# Patient Record
Sex: Female | Born: 1955
Health system: Southern US, Community
[De-identification: ages and names within clinical notes are randomized; demographics above are authoritative.]

## PROBLEM LIST (undated history)

## (undated) DIAGNOSIS — H269 Unspecified cataract: Secondary | ICD-10-CM

## (undated) DIAGNOSIS — E1165 Type 2 diabetes mellitus with hyperglycemia: Secondary | ICD-10-CM

## (undated) DIAGNOSIS — G5601 Carpal tunnel syndrome, right upper limb: Secondary | ICD-10-CM

## (undated) DIAGNOSIS — E781 Pure hyperglyceridemia: Secondary | ICD-10-CM

## (undated) DIAGNOSIS — M5412 Radiculopathy, cervical region: Secondary | ICD-10-CM

## (undated) DIAGNOSIS — Z860101 Personal history of adenomatous and serrated colon polyps: Secondary | ICD-10-CM

## (undated) DIAGNOSIS — E785 Hyperlipidemia, unspecified: Secondary | ICD-10-CM

## (undated) DIAGNOSIS — Z8711 Personal history of peptic ulcer disease: Secondary | ICD-10-CM

## (undated) DIAGNOSIS — M720 Palmar fascial fibromatosis [Dupuytren]: Secondary | ICD-10-CM

## (undated) DIAGNOSIS — M810 Age-related osteoporosis without current pathological fracture: Secondary | ICD-10-CM

## (undated) DIAGNOSIS — Z8719 Personal history of other diseases of the digestive system: Secondary | ICD-10-CM

## (undated) DIAGNOSIS — Z8742 Personal history of other diseases of the female genital tract: Secondary | ICD-10-CM

## (undated) DIAGNOSIS — IMO0002 Reserved for concepts with insufficient information to code with codable children: Secondary | ICD-10-CM

## (undated) DIAGNOSIS — Z86010 Personal history of colonic polyps: Secondary | ICD-10-CM

## (undated) DIAGNOSIS — J302 Other seasonal allergic rhinitis: Secondary | ICD-10-CM

## (undated) DIAGNOSIS — M1990 Unspecified osteoarthritis, unspecified site: Secondary | ICD-10-CM

## (undated) DIAGNOSIS — E559 Vitamin D deficiency, unspecified: Secondary | ICD-10-CM

## (undated) HISTORY — DX: Age-related osteoporosis without current pathological fracture: M81.0

## (undated) HISTORY — PX: ESOPHAGOGASTRODUODENOSCOPY: SHX1529

## (undated) HISTORY — DX: Unspecified cataract: H26.9

## (undated) HISTORY — DX: Unspecified osteoarthritis, unspecified site: M19.90

## (undated) HISTORY — PX: COLONOSCOPY: SHX174

## (undated) HISTORY — DX: Other seasonal allergic rhinitis: J30.2

## (undated) HISTORY — PX: DUPUYTREN / PALMAR FASCIOTOMY: SUR601

---

## 1996-12-26 HISTORY — PX: TOTAL ABDOMINAL HYSTERECTOMY W/ BILATERAL SALPINGOOPHORECTOMY: SHX83

## 1998-02-02 ENCOUNTER — Ambulatory Visit (HOSPITAL_COMMUNITY): Admission: RE | Admit: 1998-02-02 | Discharge: 1998-02-02 | Payer: Self-pay | Admitting: Gastroenterology

## 1998-12-26 HISTORY — PX: ROTATOR CUFF REPAIR: SHX139

## 1999-02-24 ENCOUNTER — Encounter: Payer: Self-pay | Admitting: Internal Medicine

## 1999-02-24 ENCOUNTER — Ambulatory Visit (HOSPITAL_COMMUNITY): Admission: RE | Admit: 1999-02-24 | Discharge: 1999-02-24 | Payer: Self-pay | Admitting: Internal Medicine

## 1999-08-18 ENCOUNTER — Other Ambulatory Visit: Admission: RE | Admit: 1999-08-18 | Discharge: 1999-08-18 | Payer: Self-pay | Admitting: *Deleted

## 2000-02-24 ENCOUNTER — Encounter: Admission: RE | Admit: 2000-02-24 | Discharge: 2000-02-24 | Payer: Self-pay | Admitting: Internal Medicine

## 2000-02-24 ENCOUNTER — Encounter: Payer: Self-pay | Admitting: Internal Medicine

## 2000-03-02 ENCOUNTER — Ambulatory Visit (HOSPITAL_COMMUNITY): Admission: RE | Admit: 2000-03-02 | Discharge: 2000-03-02 | Payer: Self-pay | Admitting: Gastroenterology

## 2000-03-08 ENCOUNTER — Encounter: Payer: Self-pay | Admitting: Internal Medicine

## 2000-03-08 ENCOUNTER — Ambulatory Visit (HOSPITAL_COMMUNITY): Admission: RE | Admit: 2000-03-08 | Discharge: 2000-03-08 | Payer: Self-pay | Admitting: Internal Medicine

## 2000-05-26 ENCOUNTER — Encounter: Payer: Self-pay | Admitting: General Surgery

## 2000-05-26 ENCOUNTER — Ambulatory Visit (HOSPITAL_COMMUNITY): Admission: RE | Admit: 2000-05-26 | Discharge: 2000-05-26 | Payer: Self-pay | Admitting: General Surgery

## 2000-06-01 ENCOUNTER — Ambulatory Visit (HOSPITAL_COMMUNITY): Admission: RE | Admit: 2000-06-01 | Discharge: 2000-06-01 | Payer: Self-pay | Admitting: Gastroenterology

## 2000-07-14 ENCOUNTER — Encounter: Payer: Self-pay | Admitting: General Surgery

## 2000-07-17 ENCOUNTER — Encounter: Payer: Self-pay | Admitting: General Surgery

## 2000-07-17 ENCOUNTER — Encounter (INDEPENDENT_AMBULATORY_CARE_PROVIDER_SITE_OTHER): Payer: Self-pay | Admitting: *Deleted

## 2000-07-17 ENCOUNTER — Ambulatory Visit (HOSPITAL_COMMUNITY): Admission: RE | Admit: 2000-07-17 | Discharge: 2000-07-18 | Payer: Self-pay | Admitting: General Surgery

## 2000-07-17 HISTORY — PX: LAPAROSCOPIC CHOLECYSTECTOMY: SUR755

## 2000-08-17 ENCOUNTER — Other Ambulatory Visit: Admission: RE | Admit: 2000-08-17 | Discharge: 2000-08-17 | Payer: Self-pay | Admitting: *Deleted

## 2000-09-07 ENCOUNTER — Encounter: Payer: Self-pay | Admitting: *Deleted

## 2000-09-07 ENCOUNTER — Encounter: Admission: RE | Admit: 2000-09-07 | Discharge: 2000-09-07 | Payer: Self-pay | Admitting: *Deleted

## 2001-01-04 ENCOUNTER — Encounter: Payer: Self-pay | Admitting: Internal Medicine

## 2001-01-04 ENCOUNTER — Encounter: Admission: RE | Admit: 2001-01-04 | Discharge: 2001-01-04 | Payer: Self-pay | Admitting: Internal Medicine

## 2001-08-20 ENCOUNTER — Other Ambulatory Visit: Admission: RE | Admit: 2001-08-20 | Discharge: 2001-08-20 | Payer: Self-pay | Admitting: *Deleted

## 2001-09-10 ENCOUNTER — Encounter: Payer: Self-pay | Admitting: *Deleted

## 2001-09-10 ENCOUNTER — Encounter: Admission: RE | Admit: 2001-09-10 | Discharge: 2001-09-10 | Payer: Self-pay | Admitting: *Deleted

## 2001-11-20 ENCOUNTER — Encounter: Payer: Self-pay | Admitting: Emergency Medicine

## 2001-11-20 ENCOUNTER — Emergency Department (HOSPITAL_COMMUNITY): Admission: EM | Admit: 2001-11-20 | Discharge: 2001-11-20 | Payer: Self-pay | Admitting: Emergency Medicine

## 2002-09-06 ENCOUNTER — Emergency Department (HOSPITAL_COMMUNITY): Admission: EM | Admit: 2002-09-06 | Discharge: 2002-09-06 | Payer: Self-pay | Admitting: Emergency Medicine

## 2002-09-06 ENCOUNTER — Encounter: Payer: Self-pay | Admitting: Emergency Medicine

## 2002-09-19 ENCOUNTER — Other Ambulatory Visit: Admission: RE | Admit: 2002-09-19 | Discharge: 2002-09-19 | Payer: Self-pay | Admitting: *Deleted

## 2002-10-08 ENCOUNTER — Encounter: Payer: Self-pay | Admitting: *Deleted

## 2002-10-08 ENCOUNTER — Encounter: Admission: RE | Admit: 2002-10-08 | Discharge: 2002-10-08 | Payer: Self-pay | Admitting: *Deleted

## 2002-11-19 ENCOUNTER — Ambulatory Visit (HOSPITAL_COMMUNITY): Admission: RE | Admit: 2002-11-19 | Discharge: 2002-11-19 | Payer: Self-pay | Admitting: Gastroenterology

## 2002-11-19 ENCOUNTER — Encounter (INDEPENDENT_AMBULATORY_CARE_PROVIDER_SITE_OTHER): Payer: Self-pay | Admitting: Specialist

## 2003-10-09 ENCOUNTER — Other Ambulatory Visit: Admission: RE | Admit: 2003-10-09 | Discharge: 2003-10-09 | Payer: Self-pay | Admitting: *Deleted

## 2003-10-13 ENCOUNTER — Encounter: Payer: Self-pay | Admitting: *Deleted

## 2003-10-13 ENCOUNTER — Ambulatory Visit (HOSPITAL_COMMUNITY): Admission: RE | Admit: 2003-10-13 | Discharge: 2003-10-13 | Payer: Self-pay | Admitting: *Deleted

## 2004-10-13 ENCOUNTER — Ambulatory Visit (HOSPITAL_COMMUNITY): Admission: RE | Admit: 2004-10-13 | Discharge: 2004-10-13 | Payer: Self-pay | Admitting: *Deleted

## 2004-11-16 ENCOUNTER — Other Ambulatory Visit: Admission: RE | Admit: 2004-11-16 | Discharge: 2004-11-16 | Payer: Self-pay | Admitting: *Deleted

## 2005-05-05 ENCOUNTER — Emergency Department (HOSPITAL_COMMUNITY): Admission: EM | Admit: 2005-05-05 | Discharge: 2005-05-05 | Payer: Self-pay | Admitting: Emergency Medicine

## 2005-08-15 ENCOUNTER — Emergency Department (HOSPITAL_COMMUNITY): Admission: EM | Admit: 2005-08-15 | Discharge: 2005-08-15 | Payer: Self-pay | Admitting: Family Medicine

## 2005-10-17 ENCOUNTER — Ambulatory Visit: Payer: Self-pay | Admitting: *Deleted

## 2005-11-14 ENCOUNTER — Other Ambulatory Visit: Admission: RE | Admit: 2005-11-14 | Discharge: 2005-11-14 | Payer: Self-pay | Admitting: *Deleted

## 2005-12-21 ENCOUNTER — Emergency Department (HOSPITAL_COMMUNITY): Admission: EM | Admit: 2005-12-21 | Discharge: 2005-12-21 | Payer: Self-pay | Admitting: Family Medicine

## 2006-04-10 ENCOUNTER — Ambulatory Visit (HOSPITAL_COMMUNITY): Admission: RE | Admit: 2006-04-10 | Discharge: 2006-04-10 | Payer: Self-pay | Admitting: Internal Medicine

## 2006-11-14 ENCOUNTER — Ambulatory Visit: Payer: Self-pay | Admitting: *Deleted

## 2007-04-11 ENCOUNTER — Other Ambulatory Visit: Admission: RE | Admit: 2007-04-11 | Discharge: 2007-04-11 | Payer: Self-pay | Admitting: *Deleted

## 2007-06-26 ENCOUNTER — Encounter: Admission: RE | Admit: 2007-06-26 | Discharge: 2007-06-26 | Payer: Self-pay | Admitting: Internal Medicine

## 2007-07-06 ENCOUNTER — Ambulatory Visit (HOSPITAL_COMMUNITY): Admission: RE | Admit: 2007-07-06 | Discharge: 2007-07-06 | Payer: Self-pay | Admitting: Gastroenterology

## 2007-07-06 ENCOUNTER — Encounter (INDEPENDENT_AMBULATORY_CARE_PROVIDER_SITE_OTHER): Payer: Self-pay | Admitting: Gastroenterology

## 2007-10-21 ENCOUNTER — Emergency Department (HOSPITAL_COMMUNITY): Admission: EM | Admit: 2007-10-21 | Discharge: 2007-10-21 | Payer: Self-pay | Admitting: Emergency Medicine

## 2008-02-26 ENCOUNTER — Ambulatory Visit (HOSPITAL_COMMUNITY): Admission: RE | Admit: 2008-02-26 | Discharge: 2008-02-26 | Payer: Self-pay | Admitting: *Deleted

## 2008-08-09 ENCOUNTER — Emergency Department (HOSPITAL_COMMUNITY): Admission: EM | Admit: 2008-08-09 | Discharge: 2008-08-09 | Payer: Self-pay | Admitting: Family Medicine

## 2008-12-08 ENCOUNTER — Emergency Department (HOSPITAL_COMMUNITY): Admission: EM | Admit: 2008-12-08 | Discharge: 2008-12-08 | Payer: Self-pay | Admitting: Family Medicine

## 2009-03-14 ENCOUNTER — Emergency Department (HOSPITAL_COMMUNITY): Admission: EM | Admit: 2009-03-14 | Discharge: 2009-03-14 | Payer: Self-pay | Admitting: Emergency Medicine

## 2009-03-17 ENCOUNTER — Emergency Department (HOSPITAL_COMMUNITY): Admission: EM | Admit: 2009-03-17 | Discharge: 2009-03-17 | Payer: Self-pay | Admitting: *Deleted

## 2009-04-16 ENCOUNTER — Encounter: Admission: RE | Admit: 2009-04-16 | Discharge: 2009-04-16 | Payer: Self-pay | Admitting: General Practice

## 2009-10-16 ENCOUNTER — Emergency Department (HOSPITAL_COMMUNITY): Admission: EM | Admit: 2009-10-16 | Discharge: 2009-10-16 | Payer: Self-pay | Admitting: Emergency Medicine

## 2011-03-22 ENCOUNTER — Emergency Department (HOSPITAL_COMMUNITY)
Admission: EM | Admit: 2011-03-22 | Discharge: 2011-03-22 | Disposition: A | Discharge status: Home / Self Care | Payer: Self-pay | Attending: Emergency Medicine | Admitting: Emergency Medicine

## 2011-03-22 DIAGNOSIS — L089 Local infection of the skin and subcutaneous tissue, unspecified: Secondary | ICD-10-CM | POA: Insufficient documentation

## 2011-03-22 DIAGNOSIS — R220 Localized swelling, mass and lump, head: Secondary | ICD-10-CM | POA: Insufficient documentation

## 2011-04-07 LAB — LACTIC ACID, PLASMA: Lactic Acid, Venous: 1.8 mmol/L (ref 0.5–2.2)

## 2011-04-07 LAB — APTT: aPTT: 28 seconds (ref 24–37)

## 2011-04-07 LAB — URINALYSIS, ROUTINE W REFLEX MICROSCOPIC
Glucose, UA: NEGATIVE mg/dL
Protein, ur: NEGATIVE mg/dL

## 2011-04-07 LAB — CBC
HCT: 39.5 % (ref 36.0–46.0)
Hemoglobin: 13.6 g/dL (ref 12.0–15.0)
Hemoglobin: 14.5 g/dL (ref 12.0–15.0)
MCHC: 34.4 g/dL (ref 30.0–36.0)
MCHC: 34.5 g/dL (ref 30.0–36.0)
RBC: 4.31 MIL/uL (ref 3.87–5.11)
RDW: 13.5 % (ref 11.5–15.5)

## 2011-04-07 LAB — POCT I-STAT, CHEM 8
Glucose, Bld: 119 mg/dL — ABNORMAL HIGH (ref 70–99)
HCT: 41 % (ref 36.0–46.0)
Hemoglobin: 13.9 g/dL (ref 12.0–15.0)
Potassium: 3.3 mEq/L — ABNORMAL LOW (ref 3.5–5.1)
Sodium: 140 mEq/L (ref 135–145)

## 2011-04-07 LAB — COMPREHENSIVE METABOLIC PANEL
BUN: 5 mg/dL — ABNORMAL LOW (ref 6–23)
Calcium: 8.9 mg/dL (ref 8.4–10.5)
Glucose, Bld: 118 mg/dL — ABNORMAL HIGH (ref 70–99)
Sodium: 136 mEq/L (ref 135–145)
Total Protein: 6.4 g/dL (ref 6.0–8.3)

## 2011-04-07 LAB — DIFFERENTIAL
Lymphs Abs: 2.3 10*3/uL (ref 0.7–4.0)
Monocytes Relative: 7 % (ref 3–12)
Neutro Abs: 3.5 10*3/uL (ref 1.7–7.7)
Neutrophils Relative %: 55 % (ref 43–77)

## 2011-04-07 LAB — URINE MICROSCOPIC-ADD ON

## 2011-04-07 LAB — URINE CULTURE: Colony Count: 50000

## 2011-04-07 LAB — PROTIME-INR: INR: 1 (ref 0.00–1.49)

## 2011-05-10 NOTE — Op Note (Signed)
NAME:  Bailey [name], Bailey [name]             ACCOUNT NO.:  [id number]   MEDICAL RECORD NO.:  [id number]          PATIENT TYPE:  AMB   LOCATION:  ENDO                         FACILITY:  [facility]   PHYSICIAN:  Anselmo Rod, M.D.  DATE OF BIRTH:  02-18-1956   DATE OF PROCEDURE:  07/06/2007  DATE OF DISCHARGE:                               OPERATIVE REPORT   PROCEDURE PERFORMED:  Esophagogastroduodenoscopy with cold biopsies x 3.   ENDOSCOPIST:  Anselmo Rod, M.D.   INSTRUMENT USED:  Pentax video panendoscope.   INDICATIONS FOR PROCEDURE:  55 year old African American female with a  history of abdominal pain and nausea with black stools undergoing EGD to  rule out peptic ulcer disease, esophagitis, gastritis, etc.   PREPROCEDURE PREPARATION:  Informed consent was procured from the  patient.  The patient fasted for eight hours prior to the procedure.  The risks and benefits of the procedure were discussed with the patient  in great detail.   PREPROCEDURE PHYSICAL:  The patient had stable vital signs.  NECK:  Supple.  Chest clear to auscultation.  S1 and S2 regular.  Abdomen soft  with normal bowel sounds.  Epigastric tenderness on palpation with  minimal guarding, no rebound, no rigidity, no hepatosplenomegaly.   DESCRIPTION OF PROCEDURE:  The patient was placed in left lateral  decubitus position and sedated with 50 mcg of Fentanyl and 5 mg of  Versed given intravenously in slow incremental doses. The patient was  adequately sedated and maintained on low-flow oxygen and continuous  cardiac monitoring. The Pentax video panendoscope was advanced through  the mouthpiece over the tongue into the esophagus under direct vision.  The entire esophagus was widely patent with no evidence of ring,  stricture, masses, esophagitis or Barrett's mucosa.  The scope was then  advanced into the stomach.  Small ulcer was noted in the antrum.  Multiple biopsies were done (cold biopsies x3) to rule out  presence of H  pylori. Small amount of old heme was noted in the stomach was no masses  or polyps were identified.  A retroflexion in the high cardia revealed  no evidence of a hiatal hernia.  The patient tolerated the procedure  well without complications.   IMPRESSION:  1. Small ulcer in the antrum.  Biopsies done to rule out presence of H      pylori by pathology.  2. Normal proximal small bowel.  3. Widely patent esophagus.  No evidence of ring, stricture, mass,      esophagitis or Barrett's mucosa.   RECOMMENDATIONS:  1. Continue PPI.  2. Avoid nonsteroidals.  3. Treat with antibiotics if H pylori present on biopsies.  4. Outpatient follow-up in the next 2 weeks for further      recommendations.      Anselmo Rod, M.D.  Electronically Signed     JNM/MEDQ  D:  07/06/2007  T:  07/07/2007  Job:  [id number]   cc:   Antony Madura, M.D.  Fax: [phone]

## 2011-05-13 NOTE — Op Note (Signed)
. [facility]  Patient:    Bailey [name], Bailey [name]                    MRN: [id number] Proc. Date: 07/17/00 Adm. Date:  [id number] Attending:  Glenna Fellows Tappan                           Operative Report  PREOPERATIVE DIAGNOSIS:  Chronic right upper quadrant abdominal pain, biliary dyskinesia.  POSTOPERATIVE DIAGNOSIS:  Chronic right upper quadrant abdominal pain, biliary dyskinesia.  SURGICAL PROCEDURE:  Laparoscopic cholecystectomy with intraoperative cholangiogram.  SURGEON:  Sharlet Salina T. Hoxworth, M.D.  ASSISTANT:  Anselm Pancoast. Zachery Dakins, M.D.  ANESTHESIA:  General.  BRIEF HISTORY:  Ms. Maultsby is a 55 year old white female with a history of persistent, recurring episodes of right upper quadrant abdominal pain, which have become more severe in recent months.  She has had an extensive work-up including a normal gallbladder ultrasound, a normal upper endoscopy, a normal CT scan of the abdomen and a HIDA scan, which showed abnormal delay in emptying of the gallbladder.  Due to persistent pain and these findings, we have elected to proceed with laparoscopic cholecystectomy with cholangiogram in an effort to relieve her pain.  Nature of procedure, its indications, risks of bleeding, infection, and failure to relieve her symptoms were discussed and understood.  She was brought to the operating room for that procedure.  DESCRIPTION OF PROCEDURE:  Patient brought to the operating room, placed in the supine position on the operating table and general endotracheal anesthesia was induced.  Foley catheter and oral gastric tube were in place.  The abdomen was sterilely prepped and draped.  Local anesthesia was used to infiltrate the incisions.  A 1 cm incision was made at the umbilicus and dissection carried down to the midline fascia.  She had had previous low midline incision and a history of bouts of PID.  The fascia was incised for 1 cm and direct  blunt dissection was used to dissect the omentum off the anterior abdominal wall, but I was initially unable to locate free peritoneal cavity in this area.  CO2 pressure through the Hasson trocar did produce a pneumoperitoneum and a 5 mm trocar was placed in a area in the right upper quadrant.  The 5 mm camera revealed the right upper quadrant to be free, but there were extensive adhesions along both the low and upper midline, more than might be expected from just her low midline incision.  Another 5 mm trocar was placed in the right upper quadrant and a 10 mm trocar in the epigastrium.  The midline adhesions were then carefully taken down under direct vision, clearing the upper midline and the right side of the abdomen over to the Hasson trocar at the umbilicus.  There were adhesions of omentum and also some rather dense adhesions of small bowel to the anterior abdominal wall that were taken down carefully under direct vision without any injury to the small intestine noted. Also noted at this time were numerous band-like adhesions over the dome of the liver to the diaphragm and these were all sharply lysed.  At this point, the gallbladder fundus was grasped and elevated over the liver.  There were omental adhesions to the gallbladder that were taken down bluntly and with cautery and with scissors.  The infundibulum was exposed and retracted inferolaterally and fibrofatty tissue was stripped off the neck of the gallbladder toward the port  of Hepatis.  The anterior branch of the cystic artery was seen clearly going up on the gallbladder wall and it was dissected free and divided between clips. Further dissection along the distal gallbladder dissected the cystic duct free at its junction with the gallbladder and this was dissected 360 degrees and the cystic duct dissected over about 1.5 cm.  At this point, the cystic duct was clipped at its junction with gallbladder and operative  cholangiogram was obtained through the cystic duct.  These were normal with normal size intrahepatic and common bile ducts with free flow into the duodenum and no filling defects.  Following this, the cholangiocath was removed, the cystic duct was doubly clipped proximally and divided.  The gallbladder was then dissected free from its bed using hook and spatula cautery.  Posterior branch of the cystic artery was controlled with clips.  The gallbladder was removed from the attachments of the liver and brought out through the umbilicus intact.  Complete hemostasis was assured in the right upper quadrant and this was irrigated and suctioned until clear. The camera was then put back in the epigastric port and the area of adhesiolysis of small bowel was again carefully inspected and irrigated and no injury to the small bowel was identified.  Trocars were removed under direct vision and all CO2 evacuated from the peritoneal cavity.  The purse-string suture was secured at the umbilicus.  Skin incisions were closed with interrupted subcuticular 4-0 Monocryl and Steri-Strips.  Sponge and needle counts were correct.  Dry, sterile dressings were applied and the patient taken to recovery in good condition. DD:  07/17/00 TD:  07/18/00 Job: [id number]

## 2011-05-13 NOTE — Procedures (Signed)
[place]  Patient:    [name], Bailey [name]                    MRN: [id number] Proc. Date: 03/02/00 Adm. Date:  [id number] Attending:  Charna [name] CC:         Antony Madura, M.D.                           Procedure Report  DATE OF BIRTH:  1956-04-09  REFERRING PHYSICIAN:  Antony Madura, M.D.  PROCEDURE PERFORMED:  Colonoscopy.  ENDOSCOPIST:  Anselmo Rod, M.D.  INSTRUMENT USED:  Olympus video colonoscope.  INDICATIONS:  Personal history of adenomatous polyps removed three years ago in a 55 year old female.  Rule out masses, polyps, erosions, ulcerations, diverticulosis, etc.  PREPROCEDURE PREPARATION:  Informed consent was procured from the patient.  The  patient was fasted for 8 hours prior to the procedure and prepped with a bottle of magnesium citrate and a gallon of NuLytely the night prior to the procedure.  PREPROCEDURE PHYSICAL:  Patient has stable vital signs.  NECK: Supple, no JVD, thyromegaly, lymphadenopathy.  CHEST:  Clear to auscultation. S1, S2 regular.  ABDOMEN:  Soft with normal abdominal bowel sounds.  DESCRIPTION OF PROCEDURE:  The patient was placed in left lateral decubitus position and sedated with 100 mg of Demerol and 10 mg of Versed intravenously.  Once the patient was adequately sedated and maintained on low-flow oxygen and continuous cardiac monitoring, the Olympus video colonoscope was advanced from he rectum to the cecum without difficulty.  There was one isolated diverticulum seen in the right colon.  No masses, polyps, erosions, ulcerations were seen.  The patient had small internal hemorrhoids and tolerated the procedure well without  complication.  IMPRESSION: 1. Essentially normal colonoscopy, except for a single isolated diverticulum in the    right colon. 2. No masses or polyps seen. 3. Small nonbleeding internal hemorrhoid.  RECOMMENDATIONS:  Repeat colonoscopy as recommended  in the next five years, unless the patient were develop any symptoms in the interim.  Symptoms like change in bowel habits, rectal bleeding, black stool, change in caliber of stool, abnormal weight loss, etc. needs to be reported immediately. DD:  03/02/00 TD:  03/02/00 Job: [id number]

## 2011-05-13 NOTE — Procedures (Signed)
[place]  Patient:    Bailey, [name]                    MRN: [id number] Proc. Date: 06/01/00 Adm. Date:  [id number] Disc. Date: [id number] Attending:  Charna [name] CC:         Lorne Skeens. Hoxworth, M.D.                           Procedure Report  DATE OF BIRTH: October 22, 1956  PROCEDURE: Esophagogastroduodenoscopy with biopsies.  ENDOSCOPIST: Anselmo Rod, M.D.  INSTRUMENT USED: #32 pan endoscope.  INDICATIONS FOR PROCEDURE: Epigastric and right upper quadrant pain in a 55 year old black female with recent abnormal HIDA scan, rule out peptic ulcer disease, esophagitis, gastritis, etc.  Preprocedure informed consent was obtained from the patient and the patient then fasted for eight hours prior to the procedure.  PREPROCEDURE PHYSICAL EXAMINATION:  VITAL SIGNS: Stable.  NECK: Supple.  CHEST: Clear to auscultation.  HEART: S1 and S2, regular.  ABDOMEN: Soft, with normal bowel sounds.  DESCRIPTION OF PROCEDURE: The patient was placed in the left lateral decubitus position and sedated with 50 mg of Demerol and 4 mg of Versed intravenously. Once the patient was adequately sedated she was maintained on low-flow oxygen and continuous cardiac monitoring.  The Olympus video panendoscope was advanced with a mouthpiece over the tongue and into the esophagus under direct vision.  The entire esophagus appeared normal without evidence of ring stricture, mass, lesion, or esophagitis.  There was no evidence of Barretts mucosa.  The scope was then advanced into the stomach and no hiatal hernia was seen on high retroflexion.  The entire gastric mucosa appeared healthy proximally except for a few erosions in the prepyloric area.  A CLOtest was done.  The duodenal bulb and small bowel in addition to the liver appeared normal.  Up to 60 cm there was no outlet obstruction.  The patient tolerated the procedure well without  complications.  IMPRESSION:  1. Normal appearing esophagus.  2. Few antral erosions.  3. Normal proximal small bowel.  4. CLOtest done, results pending.  RECOMMENDATIONS:  1. Continue Prilosec for now.  2. Avoid all nonsteroidals.  3. Follow up with Dr. Fara Chute for reevaluation for possible     laparoscopic cholecystectomy in the near future. DD:  06/01/00 TD:  06/05/00 Job: [id number]

## 2011-05-13 NOTE — Op Note (Signed)
NAME:  Bailey [name], Bailey [name]                       ACCOUNT NO.:  [id number]   MEDICAL RECORD NO.:  [id number]                   PATIENT TYPE:  AMB   LOCATION:  ENDO                                 FACILITY:  [facility]   PHYSICIAN:  Anselmo Rod, M.D.               DATE OF BIRTH:  1956/06/07   DATE OF PROCEDURE:  11/19/2002  DATE OF DISCHARGE:                                 OPERATIVE REPORT   PROCEDURE PERFORMED:  Colonoscopy with biopsies times four.   ENDOSCOPIST:  Charna [name], M.D.   INSTRUMENT USED:  Olympus pediatric adjustable colonoscope.   INDICATIONS FOR PROCEDURE:  The patient is a 55 year old African-American  female with a family history of colon cancer and personal history of polyps.  Rule out recurrent colonic polyps.  The patient had guaiac positive stools  in the recent past along with change in bowel habits.   PREPROCEDURE PREPARATION:  Informed consent was procured from the patient.  The patient was fasted for eight hours prior to the procedure and prepped  with a bottle of magnesium citrate and a gallon of NuLytely the night prior  to the procedure.   PREPROCEDURE PHYSICAL:  The patient had stable vital signs. Neck supple.  Chest clear to auscultation.  S1 and S2 regular.  Abdomen soft with normal  bowel sounds.   DESCRIPTION OF PROCEDURE:  The patient was placed in left lateral decubitus  position and sedated with 50 mg of Demerol and 5 mg of Versed intravenously.  Once the patient was adequately sedated and maintained on low flow oxygen  and continuous cardiac monitoring, the Olympus video colonoscope was  advanced from the rectum to the cecum.  There was solid stool in the  rectosigmoid area and the right colon.  The appendicular orifice and the  ileocecal valve were clearly visualized and photographed.  The patient's  position was changed from the left lateral to the supine position to move  the stool to adequately visualize the mucosa underlying. No masses,  polyps,  erosions or ulcerations were seen in the cecum, right colon, transverse  colon or left colon.  Two small sessile polyps were biopsied from 15 cm.  There was solid stool seen in the rectosigmoid area and small lesions could  have been missed.   IMPRESSION:  1. Two small sessile polyps present at 15 cm.  Otherwise unrevealing     colonoscopy to the cecum.  2. Large amount of residual stool in the colon.  Small lesions could have     been missed.   RECOMMENDATIONS:  1. Await pathology results.  2. Outpatient follow-up in the next two weeks for repeat guaiac testing.     Further recommendations made thereafter.    IMPRESSION:   RECOMMENDATIONS:  Anselmo Rod, M.D.    JNM/MEDQ  D:  11/19/2002  T:  11/19/2002  Job:  [id number]   cc:   Antony Madura, M.D.  [address]  [place]  [place] [address]  Fax: [phone]

## 2011-10-05 LAB — URINALYSIS, ROUTINE W REFLEX MICROSCOPIC
Bilirubin Urine: NEGATIVE
Ketones, ur: NEGATIVE
Nitrite: POSITIVE — AB
Protein, ur: 100 — AB
Specific Gravity, Urine: 1.023
Urobilinogen, UA: 0.2

## 2011-10-05 LAB — URINE CULTURE: Colony Count: 70000

## 2011-10-05 LAB — URINE MICROSCOPIC-ADD ON

## 2012-04-19 ENCOUNTER — Emergency Department (HOSPITAL_COMMUNITY)
Admission: EM | Admit: 2012-04-19 | Discharge: 2012-04-19 | Disposition: A | Discharge status: Home / Self Care | Payer: Self-pay | Attending: Emergency Medicine | Admitting: Emergency Medicine

## 2012-04-19 ENCOUNTER — Encounter (HOSPITAL_COMMUNITY): Payer: Self-pay | Admitting: *Deleted

## 2012-04-19 DIAGNOSIS — F17200 Nicotine dependence, unspecified, uncomplicated: Secondary | ICD-10-CM | POA: Insufficient documentation

## 2012-04-19 DIAGNOSIS — K122 Cellulitis and abscess of mouth: Secondary | ICD-10-CM | POA: Insufficient documentation

## 2012-04-19 MED ORDER — CLINDAMYCIN HCL 150 MG PO CAPS: ORAL_CAPSULE | ORAL | Status: DC

## 2012-04-19 MED ORDER — IBUPROFEN 800 MG PO TABS: 800.0000 mg | ORAL_TABLET | Freq: Three times a day (TID) | ORAL | Status: AC | PRN

## 2012-04-19 NOTE — Discharge Instructions (Signed)
Return here as needed. Gargle with warm water and peroxide.

## 2012-04-19 NOTE — ED Provider Notes (Signed)
History     CSN: [id number]  Arrival date & time 04/19/12  1308   First MD Initiated Contact with Patient 04/19/12 [phone]      Chief Complaint  Patient presents with  . Abscess    (Consider location/radiation/quality/duration/timing/severity/associated sxs/prior treatment) HPI The patient states that yesterday she started having some mild swelling to her L lower jaw line and the  Went to eat [place] food and then noted the swelling got worse last night and more painful. She states that nothing she did seemed to help. The patient denies neck swelling, tongue swelling, dental pain, CP, fever, SOB , weakness, or fever.  History reviewed. No pertinent past medical history.  Past Surgical History  Procedure Date  . Rotator cuff repair     right  . Abdominal hysterectomy   . Cholecystectomy     No family history on file.  History  Substance Use Topics  . Smoking status: Current Everyday Smoker -- 0.5 packs/day  . Smokeless tobacco: Not on file  . Alcohol Use: No    OB History    Grav Para Term Preterm Abortions TAB SAB Ect Mult Living                  Review of Systems All other systems negative except as documented in the HPI. All pertinent positives and negatives as reviewed in the HPI.  Allergies  Penicillins  Home Medications  No current outpatient prescriptions on file.  BP 120/73  Pulse 72  Temp(Src) 98 F (36.7 C) (Oral)  Resp 18  Wt 162 lb (73.483 kg)  SpO2 100%  Physical Exam Physical Examination: General appearance - alert, well appearing, and in no distress and oriented to person, place, and time Mental status - alert, oriented to person, place, and time, normal mood, behavior, speech, dress, motor activity, and thought processes Eyes - pupils equal and reactive, extraocular eye movements intact Ears - bilateral TM's and external ear canals normal Nose - normal and patent, no erythema, discharge or polyps Mouth - mucous membranes moist, pharynx  normal without lesions. The patient does have a small area on the L buccal surface near the area of the lower lip that appears irritated and open Neck - adenopathy noted patient has submandibular adenopathy on the L. Lymphatics see above Chest - clear to auscultation, no wheezes, rales or rhonchi, symmetric air entry, no tachypnea, retractions or cyanosis Heart - normal rate, regular rhythm, normal S1, S2, no murmurs, rubs, clicks or gallops  ED Course  Procedures (including critical care time)  The patient has what appears to be a small abscess that is opened on the buccal surface. The patient states that this area is painful on palpation. There is not any dental source visible at this time. The patient is advised to use heat and return here as needed.    MDM          Carlyle Dolly, PA-C 04/19/12 1444

## 2012-04-19 NOTE — ED Notes (Signed)
Also states has swelling in palm of left hand, getting worse, causing numbness and tingling in little finger

## 2012-04-19 NOTE — ED Notes (Signed)
Pt states "it started as a little bump but last night we went to eat [place], I thought it was my allergies or something, I was supposed to be taking finals today"; pt presents with edema to left side of face.

## 2012-04-19 NOTE — ED Provider Notes (Signed)
Medical screening examination/treatment/procedure(s) were performed by non-physician practitioner and as supervising physician I was immediately available for consultation/collaboration.   Dayton Bailiff, MD 04/19/12 [phone]

## 2012-10-22 ENCOUNTER — Ambulatory Visit (INDEPENDENT_AMBULATORY_CARE_PROVIDER_SITE_OTHER): Payer: BC Managed Care – PPO | Admitting: Family Medicine

## 2012-10-22 ENCOUNTER — Other Ambulatory Visit: Payer: Self-pay | Admitting: Radiology

## 2012-10-22 VITALS — BP 116/67 | HR 69 | Temp 98.3°F | Resp 18 | Ht 62.0 in | Wt 153.0 lb

## 2012-10-22 DIAGNOSIS — M791 Myalgia, unspecified site: Secondary | ICD-10-CM

## 2012-10-22 DIAGNOSIS — Z0000 Encounter for general adult medical examination without abnormal findings: Secondary | ICD-10-CM

## 2012-10-22 DIAGNOSIS — Z720 Tobacco use: Secondary | ICD-10-CM

## 2012-10-22 NOTE — Progress Notes (Signed)
Subjective:    Patient ID: Bailey [name], female    DOB: 08/22/1956, 56 y.o.   MRN: [id number] Chief Complaint  Patient presents with  . Wellness Check up    for insurance company    HPI  Bailey [name] is a delightful 56 yo woman who is in for her wellness exam as requested by her employer's insurance.  She dos not currently have a PCP.   Husband just quit smoking for 2-3 mos and she is trying to quit - does not want to be pushed.  Chantix and wellbutrin an dmboth just made her depressed. Hx of high chol but doesn't remember last meds.  Not fasting Sees gyn yearly for well woman care w/ pelvic and mammograms.  Past Medical History  Diagnosis Date  . Allergy    Past Surgical History  Procedure Date  . Rotator cuff repair     right  . Abdominal hysterectomy   . Cholecystectomy   . Gallbladder   . Fracture surgery    Family History  Problem Relation Age of Onset  . Diabetes Mother   . Hypertension Mother   . Dementia Mother   . Diabetes Father   . Hypertension Father   . Hypertension Sister   . Diabetes Sister   . Hypertension Brother   . Diabetes Brother    Current Outpatient Prescriptions on File Prior to Visit  Medication Sig Dispense Refill  . Multiple Vitamins-Minerals (MULTIVITAMIN WITH MINERALS) tablet Take 1 tablet by mouth daily.      Marland Kitchen ibuprofen (ADVIL,MOTRIN) 200 MG tablet Take 200 mg by mouth every 8 (eight) hours. Pain      . naproxen sodium (ALEVE) 220 MG tablet Take 220 mg by mouth 2 (two) times daily with a meal. Pain       Allergies  Allergen Reactions  . Penicillins Other (See Comments)    Does not remember       Review of Systems  All other systems reviewed and are negative.      BP 116/67  Pulse 69  Temp(Src) 98.3 F (36.8 C) (Oral)  Resp 18  Ht 5\' 2"  (1.575 m)  Wt 153 lb (69.4 kg)  BMI 27.98 kg/m2  SpO2 100% Objective:   Physical Exam  Constitutional: She is oriented to person, place, and time. She appears well-developed and  well-nourished. No distress.  HENT:  Head: Normocephalic and atraumatic.  Right Ear: Tympanic membrane, external ear and ear canal normal.  Left Ear: Tympanic membrane, external ear and ear canal normal.  Nose: Nose normal. No mucosal edema or rhinorrhea.  Mouth/Throat: Uvula is midline, oropharynx is clear and moist and mucous membranes are normal. No posterior oropharyngeal erythema.  Eyes: Conjunctivae and EOM are normal. Pupils are equal, round, and reactive to light. Right eye exhibits no discharge. Left eye exhibits no discharge. No scleral icterus.  Neck: Normal range of motion. Neck supple. No thyromegaly present.  Cardiovascular: Normal rate, regular rhythm, normal heart sounds and intact distal pulses.   Pulmonary/Chest: Effort normal and breath sounds normal. No respiratory distress.  Abdominal: Soft. Bowel sounds are normal. There is no tenderness.  Musculoskeletal: She exhibits no edema.  Lymphadenopathy:    She has no cervical adenopathy.  Neurological: She is alert and oriented to person, place, and time. She has normal reflexes.  Skin: Skin is warm and dry. She is not diaphoretic. No erythema.  Psychiatric: She has a normal mood and affect. Her behavior is normal.  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  Routine general medical examination at a health care facility - rec flp and labs with next OV.  Ins form completed.   Myalgia  No orders of the defined types were placed in this encounter.

## 2012-10-22 NOTE — Patient Instructions (Signed)
Try supplementing with several tums and a banana along with a large glass of water at night to help w/ leg muscle cramping.  If this works, consider a magnesium supplement 400mg  every evening.  Keeping You Healthy  Get These Tests  Blood Pressure- Have your blood pressure checked by your healthcare provider at least once a year.  Normal blood pressure is 120/80.  Weight- Have your body mass index (BMI) calculated to screen for obesity.  BMI is a measure of body fat based on height and weight.  You can calculate your own BMI at https://www.west-esparza.com/  Cholesterol- Have your cholesterol checked every year.  Diabetes- Have your blood sugar checked every year if you have high blood pressure, high cholesterol, a family history of diabetes or if you are overweight.  Pap Smear- Have a pap smear every 1 to 3 years if you have been sexually active.  If you are older than 65 and recent pap smears have been normal you may not need additional pap smears.  In addition, if you have had a hysterectomy  For benign disease additional pap smears are not necessary.  Mammogram-Yearly mammograms are essential for early detection of breast cancer  Screening for Colon Cancer- Colonoscopy starting at age 51. Screening may begin sooner depending on your family history and other health conditions.  Follow up colonoscopy as directed by your Gastroenterologist.  Screening for Osteoporosis- Screening begins at age 8 with bone density scanning, sooner if you are at higher risk for developing Osteoporosis.  Get these medicines  Calcium with Vitamin D- Your body requires 1200-1500 mg of Calcium a day and [phone] IU of Vitamin D a day.  You can only absorb 500 mg of Calcium at a time therefore Calcium must be taken in 2 or 3 separate doses throughout the day.  Hormones- Hormone therapy has been associated with increased risk for certain cancers and heart disease.  Talk to your healthcare provider about if you need  relief from menopausal symptoms.  Aspirin- Ask your healthcare provider about taking Aspirin to prevent Heart Disease and Stroke.  Get these Immuniztions  Flu shot- Every fall  Pneumonia shot- Once after the age of 4; if you are younger ask your healthcare provider if you need a pneumonia shot.  Tetanus- Every ten years.  Zostavax- Once after the age of 65 to prevent shingles.  Take these steps  Don't smoke- Your healthcare provider can help you quit. For tips on how to quit, ask your healthcare provider or go to www.smokefree.gov or call 1-800 QUIT-NOW.  Be physically active- Exercise 5 days a week for a minimum of 30 minutes.  If you are not already physically active, start slow and gradually work up to 30 minutes of moderate physical activity.  Try walking, dancing, bike riding, swimming, etc.  Eat a healthy diet- Eat a variety of healthy foods such as fruits, vegetables, whole grains, low fat milk, low fat cheeses, yogurt, lean meats, chicken, fish, eggs, dried beans, tofu, etc.  For more information go to www.thenutritionsource.org  Dental visit- Brush and floss teeth twice daily; visit your dentist twice a year.  Eye exam- Visit your Optometrist or Ophthalmologist yearly.  Drink alcohol in moderation- Limit alcohol intake to one drink or less a day.  Never drink and drive.  Depression- Your emotional health is as important as your physical health.  If you're feeling down or losing interest in things you normally enjoy, please talk to your healthcare provider.  Seat Belts- can  save your life; always wear one  Smoke/Carbon Monoxide detectors- These detectors need to be installed on the appropriate level of your home.  Replace batteries at least once a year.  Violence- If anyone is threatening or hurting you, please tell your healthcare provider.  Living Will/ Health care power of attorney- Discuss with your healthcare provider and family.

## 2012-10-24 ENCOUNTER — Other Ambulatory Visit (INDEPENDENT_AMBULATORY_CARE_PROVIDER_SITE_OTHER): Payer: BC Managed Care – PPO | Admitting: Family Medicine

## 2012-10-24 DIAGNOSIS — Z0000 Encounter for general adult medical examination without abnormal findings: Secondary | ICD-10-CM

## 2012-10-25 LAB — CBC WITH DIFFERENTIAL/PLATELET
Hemoglobin: 13.1 g/dL (ref 12.0–15.0)
Lymphocytes Relative: 45 % (ref 12–46)
Lymphs Abs: 3.3 10*3/uL (ref 0.7–4.0)
Monocytes Relative: 7 % (ref 3–12)
Neutro Abs: 3.3 10*3/uL (ref 1.7–7.7)
Neutrophils Relative %: 44 % (ref 43–77)
Platelets: 162 10*3/uL (ref 150–400)
RBC: 4.11 MIL/uL (ref 3.87–5.11)
WBC: 7.4 10*3/uL (ref 4.0–10.5)

## 2012-10-25 LAB — COMPREHENSIVE METABOLIC PANEL
ALT: 42 U/L — ABNORMAL HIGH (ref 0–35)
Albumin: 4.2 g/dL (ref 3.5–5.2)
CO2: 23 mEq/L (ref 19–32)
Chloride: 107 mEq/L (ref 96–112)
Glucose, Bld: 87 mg/dL (ref 70–99)
Potassium: 3.8 mEq/L (ref 3.5–5.3)
Sodium: 142 mEq/L (ref 135–145)
Total Bilirubin: 0.4 mg/dL (ref 0.3–1.2)
Total Protein: 7.2 g/dL (ref 6.0–8.3)

## 2012-10-25 LAB — LIPID PANEL
Cholesterol: 182 mg/dL (ref 0–200)
VLDL: 20 mg/dL (ref 0–40)

## 2012-12-18 ENCOUNTER — Encounter: Payer: Self-pay | Admitting: Family Medicine

## 2013-01-04 ENCOUNTER — Ambulatory Visit (INDEPENDENT_AMBULATORY_CARE_PROVIDER_SITE_OTHER): Payer: BC Managed Care – PPO | Admitting: Radiology

## 2013-01-04 DIAGNOSIS — Z23 Encounter for immunization: Secondary | ICD-10-CM

## 2013-04-09 ENCOUNTER — Emergency Department (INDEPENDENT_AMBULATORY_CARE_PROVIDER_SITE_OTHER): Payer: BC Managed Care – PPO

## 2013-04-09 ENCOUNTER — Emergency Department (INDEPENDENT_AMBULATORY_CARE_PROVIDER_SITE_OTHER)
Admission: EM | Admit: 2013-04-09 | Discharge: 2013-04-09 | Disposition: A | Discharge status: Home / Self Care | Payer: BC Managed Care – PPO | Source: Home / Self Care | Attending: Emergency Medicine | Admitting: Emergency Medicine

## 2013-04-09 ENCOUNTER — Encounter (HOSPITAL_COMMUNITY): Payer: Self-pay

## 2013-04-09 DIAGNOSIS — IMO0002 Reserved for concepts with insufficient information to code with codable children: Secondary | ICD-10-CM

## 2013-04-09 DIAGNOSIS — M5416 Radiculopathy, lumbar region: Secondary | ICD-10-CM

## 2013-04-09 MED ORDER — CYCLOBENZAPRINE HCL 10 MG PO TABS: 10.0000 mg | ORAL_TABLET | Freq: Every day | ORAL | Status: DC

## 2013-04-09 MED ORDER — MELOXICAM 7.5 MG PO TABS: 7.5000 mg | ORAL_TABLET | Freq: Every day | ORAL | Status: DC

## 2013-04-09 NOTE — ED Provider Notes (Signed)
History     CSN: [id number]  Arrival date & time 04/09/13  1850   First MD Initiated Contact with Patient 04/09/13 1855      Chief Complaint  Patient presents with  . Back Pain    (Consider location/radiation/quality/duration/timing/severity/associated sxs/prior treatment) HPI Comments: Patient presents urgent care this evening, describing that she's been having pain for approximately 2 weeks starts somewhat in the left side of her lower back shoots down her L leg "at times". Have had some numbness or tingling sensation to the lateral and anterior aspect of on her L upper leg. Patient denies any urinary symptoms such as increased frequency , pressure or burning with urination. Patient also denies constitutional symptoms such as fevers, generalized malaise, unintentional weight loss or associated abdominal pain. Patient denies any perineal numbness, urinary incontinence or fecal incontinence or changes in bowel movement patterns. Patient denies any recent injury or trauma such as falls or gestures or movements that initiated her triggered her pain. Pain is not constant and movement and certain activities does exacerbate the pain  Patient is a 57 y.o. female presenting with back pain.  Back Pain Location:  Lumbar spine Quality:  Aching Radiates to:  L thigh Pain severity:  Moderate Pain is:  Worse during the day Onset quality:  Gradual Duration:  2 weeks Timing:  Constant Chronicity:  Recurrent Context: not emotional stress and not falling   Relieved by:  Ibuprofen and bed rest Associated symptoms: no abdominal pain, no abdominal swelling, no dysuria, no fever, no headaches, no pelvic pain and no perianal numbness   Risk factors: no hx of cancer     Past Medical History  Diagnosis Date  . Allergy     Past Surgical History  Procedure Laterality Date  . Rotator cuff repair      right  . Abdominal hysterectomy    . Cholecystectomy    . Gallbladder    . Fracture surgery       Family History  Problem Relation Age of Onset  . Diabetes Mother   . Hypertension Mother   . Dementia Mother   . Diabetes Father   . Hypertension Father   . Hypertension Sister   . Diabetes Sister   . Hypertension Brother   . Diabetes Brother     History  Substance Use Topics  . Smoking status: Current Every Day Smoker -- 0.50 packs/day  . Smokeless tobacco: Not on file  . Alcohol Use: No    OB History   Grav Para Term Preterm Abortions TAB SAB Ect Mult Living                  Review of Systems  Constitutional: Positive for activity change. Negative for fever, chills, diaphoresis and appetite change.  Respiratory: Negative for cough and shortness of breath.   Gastrointestinal: Negative for abdominal pain.  Genitourinary: Negative for dysuria, frequency, hematuria and pelvic pain.  Musculoskeletal: Positive for back pain. Negative for myalgias, joint swelling, arthralgias and gait problem.  Skin: Negative for color change, pallor and rash.  Neurological: Negative for headaches.    Allergies  Codeine; Ivp dye; and Penicillins  Home Medications   Current Outpatient Rx  Name  Route  Sig  Dispense  Refill  . cyclobenzaprine (FLEXERIL) 10 MG tablet   Oral   Take 1 tablet (10 mg total) by mouth at bedtime.   21 tablet   0   . ibuprofen (ADVIL,MOTRIN) 200 MG tablet   Oral   Take  200 mg by mouth every 8 (eight) hours. Pain         . meloxicam (MOBIC) 7.5 MG tablet   Oral   Take 1 tablet (7.5 mg total) by mouth daily. Take one tablet daily for 2 weeks   14 tablet   0   . Multiple Vitamins-Minerals (MULTIVITAMIN WITH MINERALS) tablet   Oral   Take 1 tablet by mouth daily.         . naproxen sodium (ALEVE) 220 MG tablet   Oral   Take 220 mg by mouth 2 (two) times daily with a meal. Pain           BP 138/72  Pulse 67  Temp(Src) 98.1 F (36.7 C) (Oral)  Resp 16  SpO2 100%  Physical Exam  Nursing note and vitals reviewed. Constitutional:  Vital signs are normal. She appears well-developed and well-nourished.  Non-toxic appearance. She does not have a sickly appearance. She does not appear ill. No distress.  Abdominal: Soft.  Musculoskeletal: She exhibits tenderness.       Lumbar back: She exhibits decreased range of motion, tenderness, bony tenderness and pain. She exhibits no swelling, no edema, no deformity, no laceration, no spasm and normal pulse.       Back:  Neurological: She is alert.  Skin: No rash noted. No erythema.    ED Course  Procedures (including critical care time)  Labs Reviewed - No data to display Dg Lumbar Spine Complete  04/09/2013  *RADIOLOGY REPORT*  Clinical Data: Low back pain.  LUMBAR SPINE - COMPLETE 4+ VIEW  Comparison: None.  Findings: There is no fracture, subluxation, disc space narrowing, facet arthritis, or other abnormality.  IMPRESSION: Normal exam.   Original Report Authenticated By: Francene Boyers, M.D.      1. Lumbar radiculopathy, chronic       MDM  Current symptoms and exam are most consistent with some degree of lumbar radiculopathy. X-rays were not significant have prescribed patient a course of meloxicam for 2 weeks and use a muscle relaxer at night. And instructed her to followup with orthopedic Dr.       Jimmie Molly, MD 04/09/13 2004

## 2013-04-09 NOTE — ED Notes (Signed)
Back pain

## 2013-07-05 ENCOUNTER — Telehealth: Payer: Self-pay | Admitting: Physician Assistant

## 2013-07-05 ENCOUNTER — Ambulatory Visit (INDEPENDENT_AMBULATORY_CARE_PROVIDER_SITE_OTHER): Payer: BC Managed Care – PPO | Admitting: Physician Assistant

## 2013-07-05 VITALS — BP 122/72 | HR 64 | Temp 98.0°F | Resp 16 | Ht 62.5 in | Wt 156.0 lb

## 2013-07-05 DIAGNOSIS — N760 Acute vaginitis: Secondary | ICD-10-CM

## 2013-07-05 DIAGNOSIS — M720 Palmar fascial fibromatosis [Dupuytren]: Secondary | ICD-10-CM | POA: Insufficient documentation

## 2013-07-05 DIAGNOSIS — B9689 Other specified bacterial agents as the cause of diseases classified elsewhere: Secondary | ICD-10-CM

## 2013-07-05 DIAGNOSIS — R531 Weakness: Secondary | ICD-10-CM

## 2013-07-05 DIAGNOSIS — F17200 Nicotine dependence, unspecified, uncomplicated: Secondary | ICD-10-CM

## 2013-07-05 DIAGNOSIS — M6240 Contracture of muscle, unspecified site: Secondary | ICD-10-CM

## 2013-07-05 DIAGNOSIS — Z1211 Encounter for screening for malignant neoplasm of colon: Secondary | ICD-10-CM

## 2013-07-05 DIAGNOSIS — Z1159 Encounter for screening for other viral diseases: Secondary | ICD-10-CM

## 2013-07-05 DIAGNOSIS — L679 Hair color and hair shaft abnormality, unspecified: Secondary | ICD-10-CM

## 2013-07-05 DIAGNOSIS — M791 Myalgia, unspecified site: Secondary | ICD-10-CM

## 2013-07-05 DIAGNOSIS — Z0000 Encounter for general adult medical examination without abnormal findings: Secondary | ICD-10-CM

## 2013-07-05 DIAGNOSIS — IMO0001 Reserved for inherently not codable concepts without codable children: Secondary | ICD-10-CM

## 2013-07-05 DIAGNOSIS — Z23 Encounter for immunization: Secondary | ICD-10-CM | POA: Insufficient documentation

## 2013-07-05 DIAGNOSIS — Z720 Tobacco use: Secondary | ICD-10-CM

## 2013-07-05 DIAGNOSIS — M1990 Unspecified osteoarthritis, unspecified site: Secondary | ICD-10-CM | POA: Insufficient documentation

## 2013-07-05 DIAGNOSIS — Z1239 Encounter for other screening for malignant neoplasm of breast: Secondary | ICD-10-CM

## 2013-07-05 DIAGNOSIS — N898 Other specified noninflammatory disorders of vagina: Secondary | ICD-10-CM

## 2013-07-05 LAB — POCT URINALYSIS DIPSTICK
Leukocytes, UA: NEGATIVE
Nitrite, UA: NEGATIVE
Protein, UA: NEGATIVE
Urobilinogen, UA: 0.2
pH, UA: 5

## 2013-07-05 LAB — POCT WET PREP WITH KOH
KOH Prep POC: NEGATIVE
Yeast Wet Prep HPF POC: NEGATIVE

## 2013-07-05 LAB — POCT UA - MICROSCOPIC ONLY
Casts, Ur, LPF, POC: NEGATIVE
Crystals, Ur, HPF, POC: NEGATIVE
Epithelial cells, urine per micros: NEGATIVE
Yeast, UA: NEGATIVE

## 2013-07-05 LAB — POCT CBC
HCT, POC: 46.5 % (ref 37.7–47.9)
Lymph, poc: 3 (ref 0.6–3.4)
MCHC: 31.6 g/dL — AB (ref 31.8–35.4)
MCV: 101.1 fL — AB (ref 80–97)
MID (cbc): 0.5 (ref 0–0.9)
POC Granulocyte: 3.4 (ref 2–6.9)
POC LYMPH PERCENT: 43.6 %L (ref 10–50)
Platelet Count, POC: 129 10*3/uL — AB (ref 142–424)
RDW, POC: 13.7 %

## 2013-07-05 MED ORDER — METRONIDAZOLE 500 MG PO TABS: 500.0000 mg | ORAL_TABLET | Freq: Two times a day (BID) | ORAL | Status: DC

## 2013-07-05 NOTE — Progress Notes (Signed)
Subjective:    Patient ID: Bailey [name], female    DOB: May 28, 1956, 57 y.o.   MRN: [id number]  HPI This 57 y.o. female presents for Annual Wellness Examination.  It has been 3 years since her last wellness evaluation.   Last pap about 3 years ago, no history of abnormal pap test.  S/P TAH. Isn't sure when she last had a Tetanus vaccine, but thinks it was last year, at this office. She is due for a follow-up colonoscopy this year with Dr. Loreta Ave, due to her grandmother and brother dying of colon cancer (last colonoscopy 2009).  Past Medical History  Diagnosis Date  . Allergy   . Arthritis     RIGHT shoulder  . Fracture of fourth toe, right, closed     Past Surgical History  Procedure Laterality Date  . Rotator cuff repair Right     then redo, then surgery due to frozen shoulder  . Cholecystectomy    . Abdominal hysterectomy      with bilateral oophrectomy    Prior to Admission medications   Medication Sig Start Date End Date Taking? Authorizing Provider  cholecalciferol (VITAMIN D) 400 UNITS TABS Take 400 Units by mouth 2 (two) times daily.   Yes Historical Provider, MD  ibuprofen (ADVIL,MOTRIN) 200 MG tablet Take 200 mg by mouth every 6 (six) hours as needed for pain.   Yes Historical Provider, MD  Multiple Vitamins-Minerals (MULTIVITAMIN WITH MINERALS) tablet Take 1 tablet by mouth every other day.    Yes Historical Provider, MD  naproxen sodium (ANAPROX) 220 MG tablet Take 220 mg by mouth 2 (two) times daily with a meal. As needed for pain   Yes Historical Provider, MD  POTASSIUM PO Take by mouth every other day.   Yes Historical Provider, MD    Allergies  Allergen Reactions  . Codeine   . Ivp Dye (Iodinated Diagnostic Agents)   . Penicillins Other (See Comments)    Does not remember     History   Social History  . Marital Status: Married    Spouse Name: Harvie Heck    Number of Children: 0  . Years of Education: 15   Occupational History  . [employer]  Chief Executive Officer   Social History Main Topics  . Smoking status: Current Every Day Smoker -- 0.50 packs/day for 28 years  . Smokeless tobacco: Not on file     Comment: thinking about it  . Alcohol Use: No  . Drug Use: No  . Sexually Active: Yes -- Female partner(s)    Birth Control/ Protection: Surgical   Other Topics Concern  . Not on file   Social History Narrative   Lives with her husband. [name]'s children are adults-2 live in [place], 2 live in [place]    Family History  Problem Relation Age of Onset  . Diabetes Mother   . Hypertension Mother   . Dementia Mother   . Pancreatitis Mother   . Asthma Mother   . COPD Mother   . Stroke Mother   . Hyperlipidemia Mother   . Heart disease Mother   . Diabetes Father   . Hypertension Father   . Hypertension Sister   . Diabetes Sister   . Hypertension Brother   . Diabetes Brother   . Drug abuse Brother   . Cancer Brother     Colon    Review of Systems  Constitutional: Negative.   HENT: Negative.   Eyes: Negative.  Respiratory: Negative.   Cardiovascular: Negative.   Gastrointestinal: Negative.   Endocrine: Negative for cold intolerance, heat intolerance, polydipsia, polyphagia and polyuria.       Thinning hair on the top of the head x 2 months  Genitourinary: Positive for vaginal discharge (x 2 weeks; clear/white, no odor, no itching; monogamous sex with her husband, but infrequently). Negative for dysuria, urgency, frequency, hematuria, flank pain, decreased urine volume, vaginal bleeding, enuresis, difficulty urinating, genital sores, vaginal pain, menstrual problem, pelvic pain and dyspareunia.  Musculoskeletal: Positive for myalgias (intermittent crampsing in the calves, inner thighs and abdominal muscles; can occur with activity or at rest) and back pain (intermittent pain in the central low back; improves with heat application; worse with walking on concrete floors at work;).       Wears shoe  orthotics; "lump in the hands" x9-12 months  Skin: Negative.   Allergic/Immunologic: Negative.   Neurological: Negative.   Hematological: Negative.   Psychiatric/Behavioral: Negative.        Objective:   Physical Exam  Vitals reviewed. Constitutional: She is oriented to person, place, and time. Vital signs are normal. She appears well-developed and well-nourished. She is active and cooperative. No distress.  HENT:  Head: Normocephalic and atraumatic.  Right Ear: Hearing, tympanic membrane, external ear and ear canal normal. No foreign bodies.  Left Ear: Hearing, tympanic membrane, external ear and ear canal normal. No foreign bodies.  Nose: Nose normal.  Mouth/Throat: Uvula is midline, oropharynx is clear and moist and mucous membranes are normal. Dentures: upper partial plate. No oral lesions. Normal dentition. No dental abscesses or edematous. No oropharyngeal exudate.  Eyes: Conjunctivae, EOM and lids are normal. Pupils are equal, round, and reactive to light. Right eye exhibits no discharge. Left eye exhibits no discharge. No scleral icterus.  Fundoscopic exam:      The right eye shows no arteriolar narrowing, no AV nicking, no exudate, no hemorrhage and no papilledema.       The left eye shows no arteriolar narrowing, no AV nicking, no exudate, no hemorrhage and no papilledema.  Neck: Trachea normal, normal range of motion and full passive range of motion without pain. Neck supple. No spinous process tenderness and no muscular tenderness present. No mass and no thyromegaly present.  Cardiovascular: Normal rate, regular rhythm, normal heart sounds, intact distal pulses and normal pulses.   Pulmonary/Chest: Effort normal and breath sounds normal. She exhibits no tenderness and no retraction. Right breast exhibits no inverted nipple, no mass, no nipple discharge, no skin change and no tenderness. Left breast exhibits no inverted nipple, no mass, no nipple discharge, no skin change and no  tenderness. Breasts are symmetrical.  Abdominal: Soft. Normal appearance and bowel sounds are normal. She exhibits no distension and no mass. There is no hepatosplenomegaly. There is no tenderness. There is no rigidity, no rebound, no guarding, no CVA tenderness, no tenderness at McBurney's point and negative Murphy's sign. No hernia. Hernia confirmed negative in the right inguinal area and confirmed negative in the left inguinal area.  Genitourinary: Rectum normal and vagina normal. Rectal exam shows no external hemorrhoid, no internal hemorrhoid, no fissure, no mass, no tenderness and anal tone normal. No breast swelling, tenderness, discharge or bleeding. Pelvic exam was performed with patient supine. No labial fusion. There is no rash, tenderness, lesion or injury on the right labia. There is no rash, tenderness, lesion or injury on the left labia. Right adnexum displays no mass, no tenderness and no fullness. Left adnexum  displays no mass, no tenderness and no fullness. No erythema, tenderness or bleeding around the vagina. No foreign body around the vagina. No signs of injury around the vagina. No vaginal discharge found.  Cervix is surgically absent.  Musculoskeletal: She exhibits no edema and no tenderness.       Right shoulder: She exhibits tenderness. She exhibits normal range of motion (but painful ROM), no bony tenderness, no swelling, no effusion, no crepitus, no deformity, no laceration, no pain, no spasm, normal pulse and normal strength.       Left shoulder: Normal.       Right elbow: Normal.      Left elbow: Normal.       Right wrist: Normal.       Left wrist: Normal.       Cervical back: Normal.       Thoracic back: Normal.       Lumbar back: Normal.       Right hand: She exhibits deformity (Dupuytren's contracture of the 5th). She exhibits normal range of motion, no tenderness, no bony tenderness, normal capillary refill and no laceration. Normal sensation noted. Normal strength  noted.       Left hand: She exhibits deformity (Dupuytren's contracture of the 5th). She exhibits normal range of motion, no tenderness, no bony tenderness and normal capillary refill. Normal sensation noted. Normal strength noted.       Right upper leg: Normal.       Left upper leg: Normal.       Right lower leg: Normal.       Left lower leg: Normal.  Lymphadenopathy:       Head (right side): No tonsillar, no preauricular, no posterior auricular and no occipital adenopathy present.       Head (left side): No tonsillar, no preauricular, no posterior auricular and no occipital adenopathy present.    She has no cervical adenopathy.    She has no axillary adenopathy.       Right: No inguinal and no supraclavicular adenopathy present.       Left: No inguinal and no supraclavicular adenopathy present.  Neurological: She is alert and oriented to person, place, and time. She has normal strength and normal reflexes. No cranial nerve deficit. She exhibits normal muscle tone. Coordination and gait normal.  Skin: Skin is warm, dry and intact. No rash noted. She is not diaphoretic. No cyanosis or erythema. Nails show no clubbing.  Psychiatric: She has a normal mood and affect. Her speech is normal and behavior is normal. Judgment and thought content normal.       Assessment & Plan:  Routine general medical examination at a health care facility - Plan: POCT CBC, Comprehensive metabolic panel, Lipid panel, POCT UA - Microscopic Only, POCT urinalysis dipstick; Age appropriate anticipatory guidance provided.  Tobacco abuse - encouraged cessation  Myalgia - Plan: TSH  Dupuytren's Contracture of 5th digits bilaterally - Plan: Ambulatory referral to Hand Surgery  Vaginal discharge - Plan: POCT Wet Prep with KOH  Hair changes - Plan: TSH  Screening for breast cancer - Plan: MM Digital Screening  Need for hepatitis C screening test - Plan: Hepatitis C antibody  Screening for colon cancer - Plan: IFOBT  POC (occult bld, rslt in office); patient will schedule her follow-up colonoscopy with Dr. Loreta Ave.  The patient needed to leave prior to results of POCT, and paper chart review regarding her tetanus status. She asks that I contact her with all of her results  when they are available.  Fernande Bras, PA-C Physician Assistant-Certified [facility]  Addendum: This patient has not been seen in this office in >7 years, other than for a flu vaccine 12/2012.  There are no records of any other vaccinations. Based on her POCT results, she has BV and needs treatment with metronidazole.   Results for orders placed in visit on 07/05/13  POCT CBC      Result Value Range   WBC 6.8  4.6 - 10.2 K/uL   Lymph, poc 3.0  0.6 - 3.4   POC LYMPH PERCENT 43.6  10 - 50 %L   MID (cbc) 0.5  0 - 0.9   POC MID % 6.7  0 - 12 %M   POC Granulocyte 3.4  2 - 6.9   Granulocyte percent 49.7  37 - 80 %G   RBC 4.60  4.04 - 5.48 M/uL   Hemoglobin 14.7  12.2 - 16.2 g/dL   HCT, POC 16.1  09.6 - 47.9 %   MCV 101.1 (*) 80 - 97 fL   MCH, POC 32.0 (*) 27 - 31.2 pg   MCHC 31.6 (*) 31.8 - 35.4 g/dL   RDW, POC 04.5     Platelet Count, POC 129 (*) 142 - 424 K/uL   MPV 15.8  0 - 99.8 fL  POCT UA - MICROSCOPIC ONLY      Result Value Range   WBC, Ur, HPF, POC 0-1     RBC, urine, microscopic neg     Bacteria, U Microscopic neg     Mucus, UA neg     Epithelial cells, urine per micros neg     Crystals, Ur, HPF, POC neg     Casts, Ur, LPF, POC neg     Yeast, UA neg    POCT URINALYSIS DIPSTICK      Result Value Range   Color, UA yellow     Clarity, UA clear     Glucose, UA neg     Bilirubin, UA neg     Ketones, UA neg     Spec Grav, UA >=1.030     Blood, UA neg     pH, UA 5.0     Protein, UA neg'     Urobilinogen, UA 0.2     Nitrite, UA neg     Leukocytes, UA Negative    POCT WET PREP WITH KOH      Result Value Range   Trichomonas, UA Negative     Clue Cells Wet Prep HPF  POC 100%     Epithelial Wet Prep HPF POC 3-10     Yeast Wet Prep HPF POC neg     Bacteria Wet Prep HPF POC 3+     RBC Wet Prep HPF POC 1-4     WBC Wet Prep HPF POC 6-12     KOH Prep POC Negative    IFOBT (OCCULT BLOOD)      Result Value Range   IFOBT Negative

## 2013-07-05 NOTE — Telephone Encounter (Signed)
Please call this patient.  She had to leave today before her POCT results were available.  Her vaginal swab revealed BV.  Please call in Metronidazole 500 mg, 1 PO BID x 7 days, #14, no RF (I've already ordered it, but she didn't give [place] a pharmacy name, so I changed it to "phone in"). Also, I reviewed her history, and we do not have record of her tetanus vaccine.  I've placed an order for Tdap, and she may return at her convenience for that.  I'll contact her with the remaining results when I get them.

## 2013-07-05 NOTE — Patient Instructions (Addendum)
Keeping You Healthy  Get These Tests  Blood Pressure- Have your blood pressure checked by your healthcare provider at least once a year.  Normal blood pressure is 120/80.  Weight- Have your body mass index (BMI) calculated to screen for obesity.  BMI is a measure of body fat based on height and weight.  You can calculate your own BMI at www.nhlbisupport.com/bmi/  Cholesterol- Have your cholesterol checked every year.  Diabetes- Have your blood sugar checked every year if you have high blood pressure, high cholesterol, a family history of diabetes or if you are overweight.  Pap Smear- Have a pap smear every 1 to 3 years if you have been sexually active.  If you are older than 65 and recent pap smears have been normal you may not need additional pap smears.  In addition, if you have had a hysterectomy  For benign disease additional pap smears are not necessary.  Mammogram-Yearly mammograms are essential for early detection of breast cancer  Screening for Colon Cancer- Colonoscopy starting at age 50. Screening may begin sooner depending on your family history and other health conditions.  Follow up colonoscopy as directed by your Gastroenterologist.  Screening for Osteoporosis- Screening begins at age 65 with bone density scanning, sooner if you are at higher risk for developing Osteoporosis.  Get these medicines  Calcium with Vitamin D- Your body requires 1200-1500 mg of Calcium a day and 800-1000 IU of Vitamin D a day.  You can only absorb 500 mg of Calcium at a time therefore Calcium must be taken in 2 or 3 separate doses throughout the day.  Hormones- Hormone therapy has been associated with increased risk for certain cancers and heart disease.  Talk to your healthcare provider about if you need relief from menopausal symptoms.  Aspirin- Ask your healthcare provider about taking Aspirin to prevent Heart Disease and Stroke.  Get these Immuniztions  Flu shot- Every fall  Pneumonia  shot- Once after the age of 65; if you are younger ask your healthcare provider if you need a pneumonia shot.  Tetanus- Every ten years.  Zostavax- Once after the age of 60 to prevent shingles.  Take these steps  Don't smoke- Your healthcare provider can help you quit. For tips on how to quit, ask your healthcare provider or go to www.smokefree.gov or call 1-800 QUIT-NOW.  Be physically active- Exercise 5 days a week for a minimum of 30 minutes.  If you are not already physically active, start slow and gradually work up to 30 minutes of moderate physical activity.  Try walking, dancing, bike riding, swimming, etc.  Eat a healthy diet- Eat a variety of healthy foods such as fruits, vegetables, whole grains, low fat milk, low fat cheeses, yogurt, lean meats, chicken, fish, eggs, dried beans, tofu, etc.  For more information go to www.thenutritionsource.org  Dental visit- Brush and floss teeth twice daily; visit your dentist twice a year.  Eye exam- Visit your Optometrist or Ophthalmologist yearly.  Drink alcohol in moderation- Limit alcohol intake to one drink or less a day.  Never drink and drive.  Depression- Your emotional health is as important as your physical health.  If you're feeling down or losing interest in things you normally enjoy, please talk to your healthcare provider.  Seat Belts- can save your life; always wear one  Smoke/Carbon Monoxide detectors- These detectors need to be installed on the appropriate level of your home.  Replace batteries at least once a year.  Violence- If anyone   is threatening or hurting you, please tell your healthcare provider.  Living Will/ Health care power of attorney- Discuss with your healthcare provider and family.  For primary care, I recommend either this office or [facility].  If you have not heard anything regarding the referral in 1 week, please contact our office.  I will contact you with your lab results as soon as  they are available.   If you have not heard from me in 2 weeks, please contact me.  The fastest way to get your results is to register for My Chart (see the instructions on the last page of this printout).

## 2013-07-06 LAB — LIPID PANEL
HDL: 41 mg/dL (ref >39)
LDL Cholesterol: 135 mg/dL — ABNORMAL HIGH (ref 0–99)
Triglycerides: 201 mg/dL — ABNORMAL HIGH (ref <150)
VLDL: 40 mg/dL (ref 0–40)

## 2013-07-06 LAB — COMPREHENSIVE METABOLIC PANEL
AST: 31 U/L (ref 0–37)
Alkaline Phosphatase: 83 U/L (ref 39–117)
BUN: 9 mg/dL (ref 6–23)
Glucose, Bld: 125 mg/dL — ABNORMAL HIGH (ref 70–99)
Sodium: 144 mEq/L (ref 135–145)
Total Bilirubin: 0.5 mg/dL (ref 0.3–1.2)

## 2013-07-08 NOTE — Telephone Encounter (Signed)
LMOM to CB. 

## 2013-07-08 NOTE — Telephone Encounter (Signed)
When pt calls back, pt's other labs are also back. See lab message

## 2013-07-09 NOTE — Telephone Encounter (Signed)
Her labs were normal EXCEPT: 1. Glucose was elevated. Please return in the next several weeks for repeat FASTING glucose, and A1C to evaluate for diabetes. 2. The ALT, one of the liver tests, is mildly elevated. It's higher than it was 09/2012, but not as high as 4 years ago. I recommend repeating the test in 3 months. 3. The cholesterol is elevated. If she wasn't fasting, that could explain it. If she was fasting, it's important that she make healthy eating choices and get regular exercise. Taking OTC fish oil, 2000 mg daily, can also help.

## 2013-07-10 ENCOUNTER — Telehealth: Payer: Self-pay

## 2013-07-10 NOTE — Telephone Encounter (Signed)
Pt notified of all labs and rx called into pharm.

## 2013-07-10 NOTE — Telephone Encounter (Signed)
PT STATES SOMEONE CALLED HER REGARDING HER LABS. PLEASE CALL [phone]

## 2013-07-10 NOTE — Telephone Encounter (Signed)
Notes Recorded by Fernande Bras, PA-C on 07/06/2013 at 1:43 PM Please call this patient.  Her labs were normal EXCEPT: 1. Glucose was elevated. Please return in the next several weeks for repeat FASTING glucose, and A1C to evaluate for diabetes. 2. The ALT, one of the liver tests, is mildly elevated. It's higher than it was 09/2012, but not as high as 4 years ago. I recommend repeating the test in 3 months. 3. The cholesterol is elevated. If she wasn't fasting, that could explain it. If she was fasting, it's important that she make healthy eating choices and get regular exercise. Taking OTC fish oil, 2000 mg daily, can also help.

## 2013-07-19 ENCOUNTER — Ambulatory Visit (HOSPITAL_COMMUNITY)
Admission: RE | Admit: 2013-07-19 | Discharge: 2013-07-19 | Disposition: A | Discharge status: Home / Self Care | Payer: BC Managed Care – PPO | Source: Ambulatory Visit | Attending: Physician Assistant | Admitting: Physician Assistant

## 2013-07-19 DIAGNOSIS — Z1231 Encounter for screening mammogram for malignant neoplasm of breast: Secondary | ICD-10-CM | POA: Insufficient documentation

## 2013-07-19 DIAGNOSIS — Z1239 Encounter for other screening for malignant neoplasm of breast: Secondary | ICD-10-CM

## 2013-09-18 ENCOUNTER — Emergency Department (HOSPITAL_COMMUNITY): Payer: BC Managed Care – PPO

## 2013-09-18 ENCOUNTER — Encounter (HOSPITAL_COMMUNITY): Payer: Self-pay | Admitting: Emergency Medicine

## 2013-09-18 ENCOUNTER — Emergency Department (HOSPITAL_COMMUNITY)
Admission: EM | Admit: 2013-09-18 | Discharge: 2013-09-18 | Disposition: A | Discharge status: Home / Self Care | Payer: BC Managed Care – PPO | Attending: Emergency Medicine | Admitting: Emergency Medicine

## 2013-09-18 DIAGNOSIS — J3489 Other specified disorders of nose and nasal sinuses: Secondary | ICD-10-CM | POA: Insufficient documentation

## 2013-09-18 DIAGNOSIS — J069 Acute upper respiratory infection, unspecified: Secondary | ICD-10-CM | POA: Insufficient documentation

## 2013-09-18 DIAGNOSIS — Z880 Allergy status to penicillin: Secondary | ICD-10-CM | POA: Insufficient documentation

## 2013-09-18 DIAGNOSIS — Z8781 Personal history of (healed) traumatic fracture: Secondary | ICD-10-CM | POA: Insufficient documentation

## 2013-09-18 DIAGNOSIS — F17200 Nicotine dependence, unspecified, uncomplicated: Secondary | ICD-10-CM | POA: Insufficient documentation

## 2013-09-18 DIAGNOSIS — M19019 Primary osteoarthritis, unspecified shoulder: Secondary | ICD-10-CM | POA: Insufficient documentation

## 2013-09-18 DIAGNOSIS — Z79899 Other long term (current) drug therapy: Secondary | ICD-10-CM | POA: Insufficient documentation

## 2013-09-18 MED ORDER — ALBUTEROL SULFATE HFA 108 (90 BASE) MCG/ACT IN AERS: 2.0000 | INHALATION_SPRAY | RESPIRATORY_TRACT | Status: DC | PRN

## 2013-09-18 NOTE — ED Provider Notes (Signed)
CSN: [id number]     Arrival date & time 09/18/13  1191 History   First MD Initiated Contact with Patient 09/18/13 0700     Chief Complaint  Patient presents with  . URI   (Consider location/radiation/quality/duration/timing/severity/associated sxs/prior Treatment) HPI Comments: 57 yo female with productive cough and yellow sputum and congestion since Monday.  Pt needs a note to return to work tomorrow.  Tolerating po.  No cp or sob.  No sick contacts.    Patient is a 57 y.o. female presenting with URI. The history is provided by the patient.  URI Presenting symptoms: congestion, cough and rhinorrhea   Presenting symptoms: no fever   Severity:  Mild Onset quality:  Gradual Progression:  Worsening Relieved by:  Nothing Worsened by:  Nothing tried Associated symptoms: sinus pain   Associated symptoms: no arthralgias, no headaches and no neck pain   Risk factors: no recent illness, no recent travel and no sick contacts     Past Medical History  Diagnosis Date  . Allergy   . Arthritis     RIGHT shoulder  . Fracture of fourth toe, right, closed    Past Surgical History  Procedure Laterality Date  . Rotator cuff repair Right     then redo, then surgery due to frozen shoulder  . Cholecystectomy    . Abdominal hysterectomy      with bilateral oophrectomy   Family History  Problem Relation Age of Onset  . Diabetes Mother   . Hypertension Mother   . Dementia Mother   . Pancreatitis Mother   . Asthma Mother   . COPD Mother   . Stroke Mother   . Hyperlipidemia Mother   . Heart disease Mother   . Diabetes Father   . Hypertension Father   . Hypertension Sister   . Diabetes Sister   . Hypertension Brother   . Diabetes Brother   . Drug abuse Brother   . Cancer Brother     Colon   History  Substance Use Topics  . Smoking status: Current Every Day Smoker -- 0.50 packs/day for 28 years  . Smokeless tobacco: Not on file     Comment: thinking about it  . Alcohol Use: No    OB History   Grav Para Term Preterm Abortions TAB SAB Ect Mult Living                 Review of Systems  Constitutional: Negative for fever and chills.  HENT: Positive for congestion and rhinorrhea. Negative for neck pain and neck stiffness.   Eyes: Negative for visual disturbance.  Respiratory: Positive for cough. Negative for shortness of breath.   Cardiovascular: Negative for chest pain.  Gastrointestinal: Negative for vomiting and abdominal pain.  Genitourinary: Negative for dysuria and flank pain.  Musculoskeletal: Negative for back pain and arthralgias.  Skin: Negative for rash.  Neurological: Negative for light-headedness and headaches.    Allergies  Codeine; Ivp dye; and Penicillins  Home Medications   Current Outpatient Rx  Name  Route  Sig  Dispense  Refill  . cholecalciferol (VITAMIN D) 1000 UNITS tablet   Oral   Take 1,000 Units by mouth every other day.         . ibuprofen (ADVIL,MOTRIN) 200 MG tablet   Oral   Take 400 mg by mouth every 6 (six) hours as needed for pain.          . Multiple Vitamins-Minerals (MULTIVITAMIN WITH MINERALS) tablet   Oral  Take 1 tablet by mouth every other day.           BP 117/84  Pulse 76  Temp(Src) 98.3 F (36.8 C) (Oral)  Resp 16  Ht 5\' 2"  (1.575 m)  Wt 155 lb (70.308 kg)  BMI 28.34 kg/m2  SpO2 97% Physical Exam  Nursing note and vitals reviewed. Constitutional: She is oriented to person, place, and time. She appears well-developed and well-nourished.  HENT:  Head: Normocephalic and atraumatic.  Eyes: Conjunctivae are normal. Right eye exhibits no discharge. Left eye exhibits no discharge.  Neck: Normal range of motion. Neck supple. No tracheal deviation present.  Cardiovascular: Normal rate and regular rhythm.   Pulmonary/Chest: Effort normal and breath sounds normal.  Abdominal: Soft. She exhibits no distension. There is no tenderness. There is no guarding.  Musculoskeletal: She exhibits no edema and  no tenderness.  Neurological: She is alert and oriented to person, place, and time.  Skin: Skin is warm. No rash noted.  Psychiatric: She has a normal mood and affect.    ED Course  Procedures (including critical care time) Labs Review Labs Reviewed - No data to display Imaging Review No results found.  MDM  No diagnosis found. Well appearing. URI. CXR reviewed, no infiltrate/ pneumothorax-- done to rule out pneumonia. Pt cleared to return to work tomorrow. Dg Chest 2 View  09/18/2013   *RADIOLOGY REPORT*  Clinical Data: Upper respiratory infection, initial encounter.  CHEST - 2 VIEW  Comparison: 04/16/2009; 03/14/2009; chest CT - 03/14/2009  Findings:  Grossly unchanged cardiac silhouette and mediastinal contours given persistently reduced lung volumes.  Slight worsening of left basilar linear heterogeneous opacities favored to represent atelectasis or scar.  There is persistent mild eventration of the right hemidiaphragm.  No pleural effusion or pneumothorax.  No evidence of edema.  Grossly unchanged bones including apparent osteolysis of the distal end of the right clavicle.  Post cholecystectomy.  IMPRESSION: Minimal left basilar atelectasis without acute cardiopulmonary disease.   Original Report Authenticated By: Tacey Ruiz, MD      Enid Skeens, MD 09/18/13 [phone]

## 2013-09-18 NOTE — ED Notes (Signed)
Pt reports having a cough with lower back pain and generalized aching since Monday.  Pt reported this to her work and they stated she needed to be evaluated to be excused from work.

## 2014-07-24 ENCOUNTER — Ambulatory Visit (INDEPENDENT_AMBULATORY_CARE_PROVIDER_SITE_OTHER): Payer: BC Managed Care – PPO | Admitting: Emergency Medicine

## 2014-07-24 VITALS — BP 142/90 | HR 79 | Temp 98.4°F | Resp 16 | Ht 61.25 in | Wt 152.0 lb

## 2014-07-24 DIAGNOSIS — Z0000 Encounter for general adult medical examination without abnormal findings: Secondary | ICD-10-CM

## 2014-07-24 LAB — POCT UA - MICROSCOPIC ONLY
BACTERIA, U MICROSCOPIC: NEGATIVE
CASTS, UR, LPF, POC: NEGATIVE
CRYSTALS, UR, HPF, POC: NEGATIVE
Mucus, UA: NEGATIVE
RBC, urine, microscopic: NEGATIVE
WBC, Ur, HPF, POC: NEGATIVE
Yeast, UA: NEGATIVE

## 2014-07-24 LAB — POCT CBC
GRANULOCYTE PERCENT: 52.9 % (ref 37–80)
HCT, POC: 41.1 % (ref 37.7–47.9)
Hemoglobin: 13.8 g/dL (ref 12.2–16.2)
Lymph, poc: 2.9 (ref 0.6–3.4)
MCH: 32.5 pg — AB (ref 27–31.2)
MCHC: 33.6 g/dL (ref 31.8–35.4)
MCV: 96.7 fL (ref 80–97)
MID (CBC): 0.3 (ref 0–0.9)
MPV: 10.5 fL (ref 0–99.8)
PLATELET COUNT, POC: 102 10*3/uL — AB (ref 142–424)
POC Granulocyte: 3.6 (ref 2–6.9)
POC LYMPH %: 43.3 % (ref 10–50)
POC MID %: 3.8 % (ref 0–12)
RBC: 4.25 M/uL (ref 4.04–5.48)
RDW, POC: 13.2 %
WBC: 6.8 10*3/uL (ref 4.6–10.2)

## 2014-07-24 LAB — POCT URINALYSIS DIPSTICK
Bilirubin, UA: NEGATIVE
Blood, UA: NEGATIVE
GLUCOSE UA: NEGATIVE
LEUKOCYTES UA: NEGATIVE
NITRITE UA: NEGATIVE
PROTEIN UA: NEGATIVE
Spec Grav, UA: 1.02
UROBILINOGEN UA: 0.2
pH, UA: 5

## 2014-07-24 NOTE — Progress Notes (Signed)
[facility] [address] [place] [address] [phone]  Date:  07/24/2014   Name:  Bailey [name]   DOB:  1956-04-27   MRN:  [id number]  PCP:  No PCP Per Patient    Chief Complaint: CPE   History of Present Illness:  Bailey [name] is a 58 y.o. very pleasant female patient who presents with the following:  For wellness examination.  Smokes 1/2 ppd.  No medications. No chronic medical problems.  Denies other complaint or health concern today.   Patient Active Problem List   Diagnosis Date Noted  . Dupuytren's contracture of both hands 07/05/2013  . Need for Tdap vaccination 07/05/2013  . Arthritis     Past Medical History  Diagnosis Date  . Allergy   . Arthritis     RIGHT shoulder  . Fracture of fourth toe, right, closed     Past Surgical History  Procedure Laterality Date  . Rotator cuff repair Right     then redo, then surgery due to frozen shoulder  . Cholecystectomy    . Abdominal hysterectomy      with bilateral oophrectomy    History  Substance Use Topics  . Smoking status: Current Every Day Smoker -- 0.50 packs/day for 28 years  . Smokeless tobacco: Not on file     Comment: thinking about it  . Alcohol Use: No    Family History  Problem Relation Age of Onset  . Diabetes Mother   . Hypertension Mother   . Dementia Mother   . Pancreatitis Mother   . Asthma Mother   . COPD Mother   . Stroke Mother   . Hyperlipidemia Mother   . Heart disease Mother   . Diabetes Father   . Hypertension Father   . Hypertension Sister   . Diabetes Sister   . Hyperlipidemia Sister   . Hypertension Brother   . Diabetes Brother   . Drug abuse Brother   . Cancer Brother     colon  . Hyperlipidemia Brother   . Hyperlipidemia Brother   . Hypertension Brother   . Hyperlipidemia Sister   . Hypertension Sister     Allergies  Allergen Reactions  . Codeine     Unknown childhood allergy  . Ivp Dye [Iodinated Diagnostic Agents] Hives   . Penicillins Other (See Comments)    Passed out    Medication list has been reviewed and updated.  Current Outpatient Prescriptions on File Prior to Visit  Medication Sig Dispense Refill  . albuterol (PROVENTIL HFA;VENTOLIN HFA) 108 (90 BASE) MCG/ACT inhaler Inhale 2 puffs into the lungs every 4 (four) hours as needed for wheezing (cough).  1 Inhaler  0  . cholecalciferol (VITAMIN D) 1000 UNITS tablet Take 1,000 Units by mouth every other day.      . ibuprofen (ADVIL,MOTRIN) 200 MG tablet Take 400 mg by mouth every 6 (six) hours as needed for pain.       . Multiple Vitamins-Minerals (MULTIVITAMIN WITH MINERALS) tablet Take 1 tablet by mouth every other day.        No current facility-administered medications on file prior to visit.    Review of Systems:  As per HPI, otherwise negative.    Physical Examination: Filed Vitals:   07/24/14 1607  BP: 142/90  Pulse: 79  Temp: 98.4 F (36.9 C)  Resp: 16   Filed Vitals:   07/24/14 1607  Height: 5' 1.25" (1.556 m)  Weight: 152 lb (68.947  kg)   Body mass index is 28.48 kg/(m^2). Ideal Body Weight: Weight in (lb) to have BMI = 25: 133.1  GEN: WDWN, NAD, Non-toxic, A & O x 3 HEENT: Atraumatic, Normocephalic. Neck supple. No masses, No LAD. Ears and Nose: No external deformity. CV: RRR, No M/G/R. No JVD. No thrill. No extra heart sounds. PULM: CTA B, no wheezes, crackles, rhonchi. No retractions. No resp. distress. No accessory muscle use. ABD: S, NT, ND, +BS. No rebound. No HSM. EXTR: No c/c/e NEURO Normal gait.  PSYCH: Normally interactive. Conversant. Not depressed or anxious appearing.  Calm demeanor.    Assessment and Plan: Wellness screening Current on mammogram  Needs colonoscopy Labs pending   Signed,  Ellison Carwin, MD

## 2014-07-25 LAB — COMPREHENSIVE METABOLIC PANEL
ALBUMIN: 4.3 g/dL (ref 3.5–5.2)
ALK PHOS: 70 U/L (ref 39–117)
ALT: 38 U/L — AB (ref 0–35)
AST: 29 U/L (ref 0–37)
BILIRUBIN TOTAL: 0.6 mg/dL (ref 0.2–1.2)
BUN: 14 mg/dL (ref 6–23)
CO2: 26 mEq/L (ref 19–32)
Calcium: 9.5 mg/dL (ref 8.4–10.5)
Chloride: 108 mEq/L (ref 96–112)
Creat: 0.84 mg/dL (ref 0.50–1.10)
Glucose, Bld: 108 mg/dL — ABNORMAL HIGH (ref 70–99)
POTASSIUM: 3.9 meq/L (ref 3.5–5.3)
SODIUM: 141 meq/L (ref 135–145)
TOTAL PROTEIN: 7.2 g/dL (ref 6.0–8.3)

## 2014-07-25 LAB — LIPID PANEL
Cholesterol: 200 mg/dL (ref 0–200)
HDL: 43 mg/dL (ref >39)
LDL CALC: 131 mg/dL — AB (ref 0–99)
TRIGLYCERIDES: 131 mg/dL (ref <150)
Total CHOL/HDL Ratio: 4.7 Ratio
VLDL: 26 mg/dL (ref 0–40)

## 2014-07-25 LAB — TSH: TSH: 1.545 u[IU]/mL (ref 0.350–4.500)

## 2014-07-29 ENCOUNTER — Encounter: Payer: Self-pay | Admitting: *Deleted

## 2014-08-03 ENCOUNTER — Ambulatory Visit (INDEPENDENT_AMBULATORY_CARE_PROVIDER_SITE_OTHER): Payer: BC Managed Care – PPO | Admitting: Emergency Medicine

## 2014-08-03 ENCOUNTER — Ambulatory Visit (INDEPENDENT_AMBULATORY_CARE_PROVIDER_SITE_OTHER): Payer: BC Managed Care – PPO

## 2014-08-03 VITALS — BP 126/74 | HR 71 | Temp 98.3°F | Resp 18 | Ht 62.0 in | Wt 153.0 lb

## 2014-08-03 DIAGNOSIS — M542 Cervicalgia: Secondary | ICD-10-CM

## 2014-08-03 DIAGNOSIS — M25519 Pain in unspecified shoulder: Secondary | ICD-10-CM

## 2014-08-03 DIAGNOSIS — M25511 Pain in right shoulder: Secondary | ICD-10-CM

## 2014-08-03 MED ORDER — PREDNISONE 10 MG PO TABS: ORAL_TABLET | ORAL | Status: DC

## 2014-08-03 NOTE — Progress Notes (Signed)
   Subjective:    Patient ID: Bailey [name], female    DOB: 1956/10/01, 58 y.o.   MRN: [id number]  HPI patient states that 8 years ago she had shoulder surgery for torn rotator cuff by Dr. supple.. For the last 6 months she has had intermittent discomfort in her right shoulder. For the last week she has had progressive pain in the shoulder and difficulty using her right arm. Yesterday the pain became severe and she stayed home and took ibuprofen through the day. She enters today with persistent shoulder and some discomfort in her neck of note from the record apparently the surgery was complicated and required repeat surgery for a frozen shoulder .    Review of Systems     Objective:   Physical Exam patient is alert and cooperative she is not in any distress. There is tenderness along the right trapezius muscle. There is pain over the subdeltoid area and pain with abduction of the right shoulder. She has very limited internal and external rotation. Neurologically deep tendon reflexes right arm are 2+ motor strength 5 out of 5 [facility] reading (PRIMARY) by  Dr.Daub degenerative changes with spurring C3-4-5 6 and 7. Alignment is maintained. Shoulder films reveal postsurgical changes with distal clavicle removed   Meds ordered this encounter  Medications  . predniSONE (DELTASONE) 10 MG tablet    Sig: Take 6 a day for one day 5 a day for one day 4 a day for one day 3 a day for one day 2 a day for one day one a day for one day    Dispense:  21 tablet    Refill:  0        Assessment & Plan:  We'll treat with prednisone taper. She can take breakthrough pain. Referral made back to Dr. Onnie Graham for reevaluation.

## 2014-08-18 ENCOUNTER — Telehealth: Payer: Self-pay

## 2014-08-18 ENCOUNTER — Other Ambulatory Visit: Payer: Self-pay | Admitting: Emergency Medicine

## 2014-08-18 DIAGNOSIS — E559 Vitamin D deficiency, unspecified: Secondary | ICD-10-CM

## 2014-08-18 NOTE — Telephone Encounter (Signed)
I cannot find the order so that I can give the okay, but okay to order a bone density study.

## 2014-08-18 NOTE — Telephone Encounter (Signed)
Pended order for bone density scan. Please advise.

## 2014-08-18 NOTE — Telephone Encounter (Signed)
Order has been placed.

## 2014-08-18 NOTE — Telephone Encounter (Signed)
DAUB - Patient thought you wanted her to have a bone density test.  Can you make her a referral for this?  [phone]

## 2014-08-26 ENCOUNTER — Encounter: Payer: Self-pay | Admitting: Emergency Medicine

## 2014-09-07 ENCOUNTER — Telehealth: Payer: Self-pay | Admitting: *Deleted

## 2014-09-07 NOTE — Telephone Encounter (Signed)
lmom with results of bone density results  Continue calcium plus vitamin D and follow up in 2 years

## 2014-10-09 ENCOUNTER — Other Ambulatory Visit: Payer: Self-pay | Admitting: Emergency Medicine

## 2014-10-09 DIAGNOSIS — Z1231 Encounter for screening mammogram for malignant neoplasm of breast: Secondary | ICD-10-CM

## 2014-10-17 ENCOUNTER — Ambulatory Visit (HOSPITAL_COMMUNITY)
Admission: RE | Admit: 2014-10-17 | Discharge: 2014-10-17 | Disposition: A | Discharge status: Home / Self Care | Payer: BC Managed Care – PPO | Source: Ambulatory Visit | Attending: Emergency Medicine | Admitting: Emergency Medicine

## 2014-10-17 DIAGNOSIS — Z1231 Encounter for screening mammogram for malignant neoplasm of breast: Secondary | ICD-10-CM

## 2015-02-19 ENCOUNTER — Emergency Department (HOSPITAL_COMMUNITY)
Admission: EM | Admit: 2015-02-19 | Discharge: 2015-02-19 | Disposition: A | Discharge status: Home / Self Care | Payer: BLUE CROSS/BLUE SHIELD | Attending: Emergency Medicine | Admitting: Emergency Medicine

## 2015-02-19 ENCOUNTER — Encounter (HOSPITAL_COMMUNITY): Payer: Self-pay | Admitting: *Deleted

## 2015-02-19 DIAGNOSIS — W01198A Fall on same level from slipping, tripping and stumbling with subsequent striking against other object, initial encounter: Secondary | ICD-10-CM | POA: Insufficient documentation

## 2015-02-19 DIAGNOSIS — Z8739 Personal history of other diseases of the musculoskeletal system and connective tissue: Secondary | ICD-10-CM | POA: Insufficient documentation

## 2015-02-19 DIAGNOSIS — Z79899 Other long term (current) drug therapy: Secondary | ICD-10-CM | POA: Diagnosis not present

## 2015-02-19 DIAGNOSIS — Z880 Allergy status to penicillin: Secondary | ICD-10-CM | POA: Diagnosis not present

## 2015-02-19 DIAGNOSIS — Y9389 Activity, other specified: Secondary | ICD-10-CM | POA: Insufficient documentation

## 2015-02-19 DIAGNOSIS — Z720 Tobacco use: Secondary | ICD-10-CM | POA: Insufficient documentation

## 2015-02-19 DIAGNOSIS — S0993XA Unspecified injury of face, initial encounter: Secondary | ICD-10-CM | POA: Diagnosis present

## 2015-02-19 DIAGNOSIS — Y998 Other external cause status: Secondary | ICD-10-CM | POA: Diagnosis not present

## 2015-02-19 DIAGNOSIS — R220 Localized swelling, mass and lump, head: Secondary | ICD-10-CM

## 2015-02-19 DIAGNOSIS — Y9289 Other specified places as the place of occurrence of the external cause: Secondary | ICD-10-CM | POA: Insufficient documentation

## 2015-02-19 MED ORDER — DOXYCYCLINE HYCLATE 100 MG PO CAPS: 100.0000 mg | ORAL_CAPSULE | Freq: Two times a day (BID) | ORAL | Status: DC

## 2015-02-19 MED ORDER — IBUPROFEN 200 MG PO TABS: 400.0000 mg | ORAL_TABLET | Freq: Four times a day (QID) | ORAL | Status: DC | PRN

## 2015-02-19 NOTE — ED Notes (Signed)
Pt complains of pain and swelling to the left side of her face since last night. Pt denies any changes in medications or exposure to allergins. Pt states she fell last night and may have hit her face, but is not sure. Pt states pain is 5/10.

## 2015-02-19 NOTE — ED Provider Notes (Signed)
CSN: [id number]     Arrival date & time 02/19/15  1821 History  This chart was scribed for non-physician practitioner, Domenic Moras working with Blanchie Dessert, MD, by Peyton Bottoms ED Scribe. This patient was seen in room WTR5/WTR5 and the patient's care was started at 6:44 PM    Chief Complaint  Patient presents with  . Facial Pain  . Facial Swelling   The history is provided by the patient. No language interpreter was used.    HPI Comments: Bailey [name] is a 59 y.o. female with a PMHx of arthritis to right shoulder, closed fracture to right fourth toe, rotator cuff repair, cholecystectomy and abdominal hysterectomy, who presents to the Emergency Department complaining of moderate left sided facial swelling that initially began last night and worsened earlier in the afternoon today. She reports associated pain and sensitivity to face around nose and mouth. She states that she was climbing up the stairs yesterday and tripped. She is unsure of hitting her face. She denies associated injury to face. She reports applying cold and hot compresses to affected area with no relief. She states that the swelling has gradually progressed up to her eye. She states that she has allergies to codeine, ivp and but denies direct contact with materials containing products she is allergic to.  She states that she took 1 tablet of aleve last night due to associated pain to face that began last night. She deneis pain with eye movement. She denies associated fevers, chills, dental pain.     Past Medical History  Diagnosis Date  . Allergy   . Arthritis     RIGHT shoulder  . Fracture of fourth toe, right, closed    Past Surgical History  Procedure Laterality Date  . Rotator cuff repair Right     then redo, then surgery due to frozen shoulder  . Cholecystectomy    . Abdominal hysterectomy      with bilateral oophrectomy   Family History  Problem Relation Age of Onset  . Diabetes Mother   .  Hypertension Mother   . Dementia Mother   . Pancreatitis Mother   . Asthma Mother   . COPD Mother   . Stroke Mother   . Hyperlipidemia Mother   . Heart disease Mother   . Diabetes Father   . Hypertension Father   . Hypertension Sister   . Diabetes Sister   . Hyperlipidemia Sister   . Hypertension Brother   . Diabetes Brother   . Drug abuse Brother   . Cancer Brother     colon  . Hyperlipidemia Brother   . Hyperlipidemia Brother   . Hypertension Brother   . Hyperlipidemia Sister   . Hypertension Sister    History  Substance Use Topics  . Smoking status: Current Every Day Smoker -- 0.50 packs/day for 28 years  . Smokeless tobacco: Not on file     Comment: thinking about it  . Alcohol Use: No   OB History    No data available     Review of Systems  Constitutional: Negative for fever.  HENT: Positive for facial swelling.    Allergies  Codeine; Ivp dye; and Penicillins  Home Medications   Prior to Admission medications   Medication Sig Start Date End Date Taking? Authorizing Provider  albuterol (PROVENTIL HFA;VENTOLIN HFA) 108 (90 BASE) MCG/ACT inhaler Inhale 2 puffs into the lungs every 4 (four) hours as needed for wheezing (cough). 09/18/13   Mariea Clonts, MD  cholecalciferol (  VITAMIN D) 1000 UNITS tablet Take 1,000 Units by mouth every other day.    Historical Provider, MD  ibuprofen (ADVIL,MOTRIN) 200 MG tablet Take 400 mg by mouth every 6 (six) hours as needed for pain.     Historical Provider, MD  Multiple Vitamins-Minerals (MULTIVITAMIN WITH MINERALS) tablet Take 1 tablet by mouth every other day.     Historical Provider, MD  predniSONE (DELTASONE) 10 MG tablet Take 6 a day for one day 5 a day for one day 4 a day for one day 3 a day for one day 2 a day for one day one a day for one day 08/03/14   Darlyne Russian, MD  BP 144/87 mmHg  Pulse 91  Temp(Src) 98.5 F (36.9 C) (Oral)  Resp 18  SpO2 100% Physical Exam  Constitutional: She appears well-developed and  well-nourished.  HENT:  Head: Normocephalic and atraumatic.  Cerumen patch in left ear. Unable to visualize TM. Mild tenderness to left upper lip with mild edema but no gingival erythema, no abscess no trismus. Left side facial swelling. Small subcutaneous nodule noted to left upper lip that is tender to palpation. No nasal involvement. No dental abscess.  Eyes: Conjunctivae and EOM are normal. Pupils are equal, round, and reactive to light. Right eye exhibits no discharge. Left eye exhibits no discharge.  Neck: Normal range of motion.  No enlarged lymph nodes.  Pulmonary/Chest: Effort normal. No respiratory distress.  Lymphadenopathy:    She has no cervical adenopathy.  Neurological: She is alert. Coordination normal.  Skin: Skin is warm and dry. No rash noted. She is not diaphoretic. No erythema.  Psychiatric: She has a normal mood and affect.  Nursing note and vitals reviewed.  ED Course  Procedures (including critical care time)  DIAGNOSTIC STUDIES: Oxygen Saturation is 100% on room air, normal by my interpretation.    COORDINATION OF CARE: 6:51 PM- Discussed signs of infection. Will give patient antibiotic and pain medication. Advised patient to apply warm compress to affected area. Will refer patient to specialist and advised patient to be seen by specialist if symptoms worsen in 48 hrs. Pt advised of plan for treatment and pt agrees. Doubt allergic reaction.  No evidence to suggest orbital cellulitis.  No obvious abscess noted.    Labs Review Labs Reviewed - No data to display  Imaging Review No results found.   EKG Interpretation None     MDM   Final diagnoses:  Left facial swelling    BP 144/87 mmHg  Pulse 91  Temp(Src) 98.5 F (36.9 C) (Oral)  Resp 18  SpO2 100%   I personally performed the services described in this documentation, which was scribed in my presence. The recorded information has been reviewed and is accurate.    Domenic Moras, PA-C 02/19/15  1902  Blanchie Dessert, MD 02/20/15 0010

## 2015-02-19 NOTE — Discharge Instructions (Signed)
You have been evaluated for facial swelling.  It appears swelling is from a skin infection.  Take antibiotic and pain medication as prescribed.  Apply warm compress to affected area several times daily.  Follow up with ENT provider or return to ER if your condition worsen.   Cellulitis Cellulitis is an infection of the skin and the tissue beneath it. The infected area is usually red and tender. Cellulitis occurs most often in the arms and lower legs.  CAUSES  Cellulitis is caused by bacteria that enter the skin through cracks or cuts in the skin. The most common types of bacteria that cause cellulitis are staphylococci and streptococci. SIGNS AND SYMPTOMS   Redness and warmth.  Swelling.  Tenderness or pain.  Fever. DIAGNOSIS  Your health care provider can usually determine what is wrong based on a physical exam. Blood tests may also be done. TREATMENT  Treatment usually involves taking an antibiotic medicine. HOME CARE INSTRUCTIONS   Take your antibiotic medicine as directed by your health care provider. Finish the antibiotic even if you start to feel better.  Keep the infected arm or leg elevated to reduce swelling.  Apply a warm cloth to the affected area up to 4 times per day to relieve pain.  Take medicines only as directed by your health care provider.  Keep all follow-up visits as directed by your health care provider. SEEK MEDICAL CARE IF:   You notice red streaks coming from the infected area.  Your red area gets larger or turns dark in color.  Your bone or joint underneath the infected area becomes painful after the skin has healed.  Your infection returns in the same area or another area.  You notice a swollen bump in the infected area.  You develop new symptoms.  You have a fever. SEEK IMMEDIATE MEDICAL CARE IF:   You feel very sleepy.  You develop vomiting or diarrhea.  You have a general ill feeling (malaise) with muscle aches and pains. MAKE SURE  YOU:   Understand these instructions.  Will watch your condition.  Will get help right away if you are not doing well or get worse. Document Released: 09/21/2005 Document Revised: 04/28/2014 Document Reviewed: 02/27/2012 [facility] Patient Information 2015 [place], [place]. This information is not intended to replace advice given to you by your health care provider. Make sure you discuss any questions you have with your health care provider.

## 2015-08-22 ENCOUNTER — Encounter (HOSPITAL_COMMUNITY): Payer: Self-pay | Admitting: Emergency Medicine

## 2015-08-22 ENCOUNTER — Emergency Department (HOSPITAL_COMMUNITY): Payer: Self-pay

## 2015-08-22 ENCOUNTER — Emergency Department (HOSPITAL_COMMUNITY)
Admission: EM | Admit: 2015-08-22 | Discharge: 2015-08-22 | Disposition: A | Discharge status: Home / Self Care | Payer: Self-pay | Attending: Emergency Medicine | Admitting: Emergency Medicine

## 2015-08-22 ENCOUNTER — Emergency Department (HOSPITAL_COMMUNITY): Payer: BLUE CROSS/BLUE SHIELD

## 2015-08-22 DIAGNOSIS — Z720 Tobacco use: Secondary | ICD-10-CM | POA: Insufficient documentation

## 2015-08-22 DIAGNOSIS — Z91040 Latex allergy status: Secondary | ICD-10-CM | POA: Insufficient documentation

## 2015-08-22 DIAGNOSIS — M21371 Foot drop, right foot: Secondary | ICD-10-CM | POA: Insufficient documentation

## 2015-08-22 DIAGNOSIS — Z880 Allergy status to penicillin: Secondary | ICD-10-CM | POA: Insufficient documentation

## 2015-08-22 DIAGNOSIS — R739 Hyperglycemia, unspecified: Secondary | ICD-10-CM | POA: Insufficient documentation

## 2015-08-22 DIAGNOSIS — Z8781 Personal history of (healed) traumatic fracture: Secondary | ICD-10-CM | POA: Insufficient documentation

## 2015-08-22 LAB — DIFFERENTIAL
BASOS ABS: 0 10*3/uL (ref 0.0–0.1)
Basophils Relative: 0 % (ref 0–1)
EOS PCT: 2 % (ref 0–5)
Eosinophils Absolute: 0.1 10*3/uL (ref 0.0–0.7)
LYMPHS PCT: 31 % (ref 12–46)
Lymphs Abs: 2.4 10*3/uL (ref 0.7–4.0)
Monocytes Absolute: 0.7 10*3/uL (ref 0.1–1.0)
Monocytes Relative: 9 % (ref 3–12)
NEUTROS ABS: 4.4 10*3/uL (ref 1.7–7.7)
NEUTROS PCT: 58 % (ref 43–77)

## 2015-08-22 LAB — COMPREHENSIVE METABOLIC PANEL
ALBUMIN: 4 g/dL (ref 3.5–5.0)
ALK PHOS: 95 U/L (ref 38–126)
ALT: 33 U/L (ref 14–54)
ANION GAP: 6 (ref 5–15)
AST: 27 U/L (ref 15–41)
BUN: 9 mg/dL (ref 6–20)
CALCIUM: 9.2 mg/dL (ref 8.9–10.3)
CO2: 28 mmol/L (ref 22–32)
Chloride: 104 mmol/L (ref 101–111)
Creatinine, Ser: 0.68 mg/dL (ref 0.44–1.00)
GFR calc Af Amer: >60 mL/min (ref >60)
GFR calc non Af Amer: >60 mL/min (ref >60)
GLUCOSE: 325 mg/dL — AB (ref 65–99)
Potassium: 3.9 mmol/L (ref 3.5–5.1)
SODIUM: 138 mmol/L (ref 135–145)
Total Bilirubin: 0.9 mg/dL (ref 0.3–1.2)
Total Protein: 7.5 g/dL (ref 6.5–8.1)

## 2015-08-22 LAB — CBC
HCT: 43.1 % (ref 36.0–46.0)
Hemoglobin: 14.5 g/dL (ref 12.0–15.0)
MCH: 31.1 pg (ref 26.0–34.0)
MCHC: 33.6 g/dL (ref 30.0–36.0)
MCV: 92.5 fL (ref 78.0–100.0)
PLATELETS: 129 10*3/uL — AB (ref 150–400)
RBC: 4.66 MIL/uL (ref 3.87–5.11)
RDW: 13 % (ref 11.5–15.5)
WBC: 7.6 10*3/uL (ref 4.0–10.5)

## 2015-08-22 LAB — I-STAT TROPONIN, ED: Troponin i, poc: 0 ng/mL (ref 0.00–0.08)

## 2015-08-22 LAB — URINALYSIS, ROUTINE W REFLEX MICROSCOPIC
Bilirubin Urine: NEGATIVE
Hgb urine dipstick: NEGATIVE
Ketones, ur: NEGATIVE mg/dL
LEUKOCYTES UA: NEGATIVE
Nitrite: NEGATIVE
PROTEIN: NEGATIVE mg/dL
Specific Gravity, Urine: 1.033 — ABNORMAL HIGH (ref 1.005–1.030)
Urobilinogen, UA: 0.2 mg/dL (ref 0.0–1.0)
pH: 5.5 (ref 5.0–8.0)

## 2015-08-22 LAB — PROTIME-INR
INR: 1 (ref 0.00–1.49)
PROTHROMBIN TIME: 13.4 s (ref 11.6–15.2)

## 2015-08-22 LAB — URINE MICROSCOPIC-ADD ON

## 2015-08-22 LAB — APTT: aPTT: 27 seconds (ref 24–37)

## 2015-08-22 MED ORDER — SODIUM CHLORIDE 0.9 % IV BOLUS (SEPSIS)
500.0000 mL | Freq: Once | INTRAVENOUS | Status: AC
  Administered 2015-08-22: 500 mL via INTRAVENOUS

## 2015-08-22 MED ORDER — METFORMIN HCL 850 MG PO TABS: 850.0000 mg | ORAL_TABLET | Freq: Every day | ORAL | Status: DC

## 2015-08-22 NOTE — Discharge Instructions (Signed)

## 2015-08-22 NOTE — ED Notes (Signed)
Pt c/o right foot flopping x week. Pt states that she noticed last week when she was running little to get across the street to beat traffic her right foot would flop.  Pt states that she fell going up her garage steps and landing on that right leg three weeks ago.   Pt also c/o right upper gum swelling this am. Pt states that she is unable to put her partial in due to the swelling.   Pt also states that couple weeks ago she checked her blood sugar at her sister's house and read 300s. Pt is not a diagnosed Diabetic.

## 2015-08-22 NOTE — ED Notes (Signed)
Pt giving something to eat and drink

## 2015-08-22 NOTE — ED Notes (Signed)
Pt a+o. Verbally responsive. Respirations even and unlabored. ABCs intact. SR on monitor. IV saline lock patent and intact.

## 2015-08-22 NOTE — ED Notes (Signed)
Pt gone for MRI.

## 2015-08-22 NOTE — ED Notes (Signed)
Pt asked to urinate but can't void. Nurse aware

## 2015-08-22 NOTE — ED Notes (Addendum)
Pt reports right sided weakness that worsened this morning. States that this has been going on for 3-4 weeks but noticed her rt foot flopping while walking. Speech clear. Neuro check performed. Rt unilateral weakness.

## 2015-08-22 NOTE — ED Notes (Addendum)
Awake. Verbally responsive. A/O x4. Resp even and unlabored. No audible adventitious breath sounds noted. ABC's intact. SR on monitor. IV infusing NS at 534ml/hr without difficulty. Pt given meal tray.

## 2015-08-22 NOTE — ED Notes (Signed)
Awake. Verbally responsive. A/O x4. Resp even and unlabored. No audible adventitious breath sounds noted. ABC's intact. SR on monitor. IV saline lock patent and intact. 

## 2015-08-22 NOTE — ED Provider Notes (Signed)
CSN: [id number]     Arrival date & time 08/22/15  5885 History   First MD Initiated Contact with Patient 08/22/15 [phone]     Chief Complaint  Patient presents with  . Oral Swelling  . Foot Problem    HPI Pt has noticed trouble with her walking for the past week.  She feels like her foot is flopping sometimes.  She has some discomfort in the right calf.  She feels like her foot is flopping down when she is trying to step.   She has some low back pain on occasion. She does not have any insurance and does not have a doctor right now.  Yesterday she noticed pain in her right arm.  Pain increases with her movement of her arm and shoulder.  She feels like her arm might be weak but not sure if it is because of pain.  She does have an old rotator cuff injury.  This morning she had pain in the gums on the right side and had trouble putting her partials in.  She called her niece and she told her to go to the hospital. Past Medical History  Diagnosis Date  . Allergy   . Arthritis     RIGHT shoulder  . Fracture of fourth toe, right, closed    Past Surgical History  Procedure Laterality Date  . Rotator cuff repair Right     then redo, then surgery due to frozen shoulder  . Cholecystectomy    . Abdominal hysterectomy      with bilateral oophrectomy   Family History  Problem Relation Age of Onset  . Diabetes Mother   . Hypertension Mother   . Dementia Mother   . Pancreatitis Mother   . Asthma Mother   . COPD Mother   . Stroke Mother   . Hyperlipidemia Mother   . Heart disease Mother   . Diabetes Father   . Hypertension Father   . Hypertension Sister   . Diabetes Sister   . Hyperlipidemia Sister   . Hypertension Brother   . Diabetes Brother   . Drug abuse Brother   . Cancer Brother     colon  . Hyperlipidemia Brother   . Hyperlipidemia Brother   . Hypertension Brother   . Hyperlipidemia Sister   . Hypertension Sister    Social History  Substance Use Topics  . Smoking status:  Current Every Day Smoker -- 0.50 packs/day for 28 years  . Smokeless tobacco: None     Comment: thinking about it  . Alcohol Use: No   OB History    No data available     Review of Systems  All other systems reviewed and are negative.     Allergies  Codeine; Ivp dye; Latex; and Penicillins  Home Medications   Prior to Admission medications   Medication Sig Start Date End Date Taking? Authorizing Provider  aspirin-acetaminophen-caffeine (EXCEDRIN MIGRAINE) 5755115749 MG per tablet Take 1 tablet by mouth every 6 (six) hours as needed for headache.   Yes Historical Provider, MD  ibuprofen (ADVIL,MOTRIN) 200 MG tablet Take 2 tablets (400 mg total) by mouth every 6 (six) hours as needed for moderate pain. 02/19/15  Yes Domenic Moras, PA-C  Multiple Vitamins-Minerals (MULTIVITAMIN WITH MINERALS) tablet Take 1 tablet by mouth every other day.    Yes Historical Provider, MD  metFORMIN (GLUCOPHAGE) 850 MG tablet Take 1 tablet (850 mg total) by mouth daily with breakfast. 08/22/15   Dorie Rank, MD   BP  142/58 mmHg  Pulse 102  Temp(Src) 98.5 F (36.9 C) (Oral)  Resp 20  SpO2 98% Physical Exam  Constitutional: She is oriented to person, place, and time. She appears well-developed and well-nourished. No distress.  HENT:  Head: Normocephalic and atraumatic.  Right Ear: External ear normal.  Left Ear: External ear normal.  Mouth/Throat: Oropharynx is clear and moist.  Eyes: Conjunctivae are normal. Right eye exhibits no discharge. Left eye exhibits no discharge. No scleral icterus.  Neck: Neck supple. No tracheal deviation present.  Cardiovascular: Normal rate, regular rhythm and intact distal pulses.   Pulmonary/Chest: Effort normal and breath sounds normal. No stridor. No respiratory distress. She has no wheezes. She has no rales.  Abdominal: Soft. Bowel sounds are normal. She exhibits no distension. There is no tenderness. There is no rebound and no guarding.  Musculoskeletal: She exhibits  no edema or tenderness.  Neurological: She is alert and oriented to person, place, and time. She has normal strength. No cranial nerve deficit (no facial droop, extraocular movements intact, no slurred speech) or sensory deficit. She exhibits normal muscle tone. She displays no seizure activity. Coordination normal.  No pronator drift bilateral upper extrem, able to hold both legs off bed for 5 seconds, sensation intact in all extremities, no visual field cuts, no left or right sided neglect, normal finger-nose exam bilaterally, no nystagmus noted, 5/5 plantar flexion bilaterally, weakness to resistance of right foot dorseflexion   Skin: Skin is warm and dry. No rash noted.  Psychiatric: She has a normal mood and affect.  Nursing note and vitals reviewed.   ED Course  Procedures (including critical care time) Labs Review Labs Reviewed  CBC - Abnormal; Notable for the following:    Platelets 129 (*)    All other components within normal limits  COMPREHENSIVE METABOLIC PANEL - Abnormal; Notable for the following:    Glucose, Bld 325 (*)    All other components within normal limits  URINALYSIS, ROUTINE W REFLEX MICROSCOPIC (NOT AT [facility]) - Abnormal; Notable for the following:    Specific Gravity, Urine 1.033 (*)    Glucose, UA >1000 (*)    All other components within normal limits  URINE MICROSCOPIC-ADD ON - Abnormal; Notable for the following:    Squamous Epithelial / LPF FEW (*)    All other components within normal limits  PROTIME-INR  APTT  DIFFERENTIAL  I-STAT TROPOININ, ED    Imaging Review Dg Lumbar Spine Complete  08/22/2015   CLINICAL DATA:  Weakness of right foot when walking. Trauma 3 weeks ago.  EXAM: LUMBAR SPINE - COMPLETE 4+ VIEW  COMPARISON:  04/09/2013  FINDINGS: Surgical changes in the pelvis. Sacroiliac joints are symmetric. Five lumbar type vertebral bodies. Cholecystectomy. Maintenance of vertebral body height and alignment. Lower thoracic spondylosis including  endplate osteophytes at T10-11. Lumbosacral disc height maintained.  IMPRESSION: No acute osseous abnormality.   Electronically Signed   By: Abigail Miyamoto M.D.   On: 08/22/2015 11:03   Ct Head Wo Contrast  08/22/2015   CLINICAL DATA:  Right-sided weakness.  Symptoms for 3 weeks.  EXAM: CT HEAD WITHOUT CONTRAST  TECHNIQUE: Contiguous axial images were obtained from the base of the skull through the vertex without intravenous contrast.  COMPARISON:  None  FINDINGS: Sinuses/Soft tissues: Hypoplastic frontal sinuses. Clear mastoid air cells.  Intracranial: No mass lesion, hemorrhage, hydrocephalus, acute infarct, intra-axial, or extra-axial fluid collection.  IMPRESSION: No acute intracranial abnormality.   Electronically Signed   By: Adria Devon.D.  On: 08/22/2015 11:05   Mr Brain Wo Contrast  08/22/2015   CLINICAL DATA:  Right leg weakness  EXAM: MRI HEAD WITHOUT CONTRAST  TECHNIQUE: Multiplanar, multiecho pulse sequences of the brain and surrounding structures were obtained without intravenous contrast.  COMPARISON:  CT head 08/22/2015  FINDINGS: Ventricle size is normal. Cerebral volume is normal. Pituitary normal in size. Negative for Chiari malformation.  Negative for acute infarct. Mild to moderate chronic microvascular ischemic change in the white matter and pons bilaterally.  Negative for hemorrhage or mass lesion.  Negative for brain edema  Mild mucosal edema in the paranasal sinuses bilaterally.  IMPRESSION: Negative for acute abnormality. Mild to moderate chronic microvascular ischemic change in the white matter.   Electronically Signed   By: Franchot Gallo M.D.   On: 08/22/2015 13:18     EKG Interpretation   Date/Time:  Saturday August 22 2015 10:05:35 EDT Ventricular Rate:  60 PR Interval:  143 QRS Duration: 86 QT Interval:  415 QTC Calculation: 415 R Axis:   37 Text Interpretation:  Sinus rhythm Baseline wander in lead(s) V1 No  significant change since last tracing Confirmed by  Thales Knipple  MD-J, Jerrian Mells  ([id number]) on 08/22/2015 10:12:15 AM      MDM   Final diagnoses:  Hyperglycemia  Foot drop, right    Patient had a thorough evaluation in the emergency department. She complained of several different complaints but primarily was having some difficulties with weakness of her right foot. She also had some complaints of some numbness and weakness in her right upper extremity that was not able to demonstrate in the emergency department. An MRI was performed to exclude the possibility of acute stroke. This test was negative. Possible her lower extremity weakness may be related to a peripheral or lumbar neuropathy.  I will refer the patient for further outpatient evaluation with a neurologist.  Patient also has newly diagnosed diabetes mellitus with elevated blood sugar. I will refer her to [facility] and wellness. Prescription for metformin daily.  Patient is reassured and comfortable with this plan.    Dorie Rank, MD 08/22/15 [phone]

## 2015-08-22 NOTE — ED Notes (Signed)
Awake. Verbally responsive. A/O x4. Resp even and unlabored. No audible adventitious breath sounds noted. ABC's intact. SR on monitor. 

## 2015-10-19 ENCOUNTER — Ambulatory Visit: Payer: BLUE CROSS/BLUE SHIELD | Admitting: Neurology

## 2015-11-12 ENCOUNTER — Encounter: Payer: Self-pay | Admitting: Family Medicine

## 2015-11-12 ENCOUNTER — Ambulatory Visit: Payer: BLUE CROSS/BLUE SHIELD | Attending: Family Medicine | Admitting: Family Medicine

## 2015-11-12 VITALS — BP 133/82 | HR 83 | Temp 98.5°F | Resp 16 | Ht 62.0 in | Wt 141.0 lb

## 2015-11-12 DIAGNOSIS — R739 Hyperglycemia, unspecified: Secondary | ICD-10-CM

## 2015-11-12 DIAGNOSIS — Z0000 Encounter for general adult medical examination without abnormal findings: Secondary | ICD-10-CM | POA: Diagnosis not present

## 2015-11-12 DIAGNOSIS — Z90710 Acquired absence of both cervix and uterus: Secondary | ICD-10-CM | POA: Insufficient documentation

## 2015-11-12 DIAGNOSIS — Z7982 Long term (current) use of aspirin: Secondary | ICD-10-CM | POA: Insufficient documentation

## 2015-11-12 DIAGNOSIS — E786 Lipoprotein deficiency: Secondary | ICD-10-CM

## 2015-11-12 DIAGNOSIS — E782 Mixed hyperlipidemia: Secondary | ICD-10-CM

## 2015-11-12 DIAGNOSIS — E1165 Type 2 diabetes mellitus with hyperglycemia: Secondary | ICD-10-CM | POA: Insufficient documentation

## 2015-11-12 DIAGNOSIS — F17200 Nicotine dependence, unspecified, uncomplicated: Secondary | ICD-10-CM | POA: Insufficient documentation

## 2015-11-12 DIAGNOSIS — N951 Menopausal and female climacteric states: Secondary | ICD-10-CM | POA: Diagnosis not present

## 2015-11-12 DIAGNOSIS — E781 Pure hyperglyceridemia: Secondary | ICD-10-CM

## 2015-11-12 DIAGNOSIS — Z794 Long term (current) use of insulin: Secondary | ICD-10-CM | POA: Insufficient documentation

## 2015-11-12 DIAGNOSIS — Z7984 Long term (current) use of oral hypoglycemic drugs: Secondary | ICD-10-CM | POA: Insufficient documentation

## 2015-11-12 DIAGNOSIS — E1169 Type 2 diabetes mellitus with other specified complication: Secondary | ICD-10-CM

## 2015-11-12 DIAGNOSIS — K0889 Other specified disorders of teeth and supporting structures: Secondary | ICD-10-CM | POA: Insufficient documentation

## 2015-11-12 DIAGNOSIS — Z79899 Other long term (current) drug therapy: Secondary | ICD-10-CM | POA: Insufficient documentation

## 2015-11-12 LAB — LIPID PANEL
CHOL/HDL RATIO: 7 ratio — AB (ref <=5.0)
Cholesterol: 197 mg/dL (ref 125–200)
HDL: 28 mg/dL — ABNORMAL LOW (ref >=46)
Triglycerides: 408 mg/dL — ABNORMAL HIGH (ref <150)

## 2015-11-12 LAB — POCT URINALYSIS DIPSTICK
BILIRUBIN UA: NEGATIVE
GLUCOSE UA: 500
Ketones, UA: NEGATIVE
Leukocytes, UA: NEGATIVE
Nitrite, UA: NEGATIVE
Protein, UA: NEGATIVE
RBC UA: NEGATIVE
SPEC GRAV UA: 1.02
Urobilinogen, UA: 0.2
pH, UA: 5

## 2015-11-12 LAB — POCT GLYCOSYLATED HEMOGLOBIN (HGB A1C): Hemoglobin A1C: 12.3

## 2015-11-12 LAB — GLUCOSE, POCT (MANUAL RESULT ENTRY): POC Glucose: 368 mg/dl — AB (ref 70–99)

## 2015-11-12 MED ORDER — METFORMIN HCL ER 500 MG PO TB24
500.0000 mg | ORAL_TABLET | Freq: Every day | ORAL | Status: DC
Start: 2015-11-12 — End: 2015-11-26

## 2015-11-12 MED ORDER — TRUE METRIX METER W/DEVICE KIT: 1.0000 | PACK | Status: DC | PRN

## 2015-11-12 MED ORDER — GLUCOSE BLOOD VI STRP: 1.0000 | ORAL_STRIP | Freq: Three times a day (TID) | Status: DC

## 2015-11-12 MED ORDER — SITAGLIPTIN PHOSPHATE 100 MG PO TABS: 100.0000 mg | ORAL_TABLET | Freq: Every day | ORAL | Status: DC

## 2015-11-12 MED ORDER — INSULIN ASPART 100 UNIT/ML ~~LOC~~ SOLN
20.0000 [IU] | Freq: Once | SUBCUTANEOUS | Status: AC
  Administered 2015-11-12: 20 [IU] via SUBCUTANEOUS

## 2015-11-12 MED ORDER — TRUEPLUS LANCETS 28G MISC: 1.0000 | Freq: Three times a day (TID) | Status: DC

## 2015-11-12 MED ORDER — METFORMIN HCL ER 500 MG PO TB24: 500.0000 mg | ORAL_TABLET | Freq: Every day | ORAL | Status: DC

## 2015-11-12 NOTE — Patient Instructions (Addendum)
Bailey [name] was seen today for diabetes.  Diagnoses and all orders for this visit:  Uncontrolled type 2 diabetes mellitus with hyperglycemia, without long-term current use of insulin ([place]) -     Ambulatory referral to Ophthalmology -     Discontinue: sitaGLIPtin (JANUVIA) 100 MG tablet; Take 1 tablet (100 mg total) by mouth daily. -     Discontinue: metFORMIN (GLUCOPHAGE XR) 500 MG 24 hr tablet; Take 1 tablet (500 mg total) by mouth daily with breakfast. -     Blood Glucose Monitoring Suppl (TRUE METRIX METER) W/DEVICE KIT; 1 each by Does not apply route as needed. -     glucose blood (TRUE METRIX BLOOD GLUCOSE TEST) test strip; 1 each by Other route 3 (three) times daily. -     TRUEPLUS LANCETS 28G MISC; 1 each by Does not apply route 3 (three) times daily. -     sitaGLIPtin (JANUVIA) 100 MG tablet; Take 1 tablet (100 mg total) by mouth daily. -     metFORMIN (GLUCOPHAGE XR) 500 MG 24 hr tablet; Take 1 tablet (500 mg total) by mouth daily with breakfast. -     Lipid Panel  Hyperglycemia -     POCT glycosylated hemoglobin (Hb A1C) -     POCT glucose (manual entry) -     Microalbumin/Creatinine Ratio, Urine -     POCT urinalysis dipstick -     insulin aspart (novoLOG) injection 20 Units; Inject 0.2 mLs (20 Units total) into the skin once.  Menopausal symptoms -     Ambulatory referral to Gynecology  Healthcare maintenance -     Ambulatory referral to Gastroenterology  Pain, dental -     Ambulatory referral to Dentistry   Diabetes blood sugar goals  Fasting (in AM before breakfast, 8 hrs of no eating or drinking (except water or unsweetened coffee or tea): 90-110 2 hrs after meals: < 160,   No low sugars: nothing < 70  Please apply for [facility] discount and orange card, you can also inquire if any of your medications are on the PASS (medications assistance) list.   F/u in 2-3 weeks with pharmacist for diabetes/med review F/u with me in 6 weeks for diabetes  Dr. Adrian Blackwater      .

## 2015-11-12 NOTE — Assessment & Plan Note (Signed)
A: uncontrolled diabetes, recent diagnosis. Patient declines insulin at this time. P: januvia 100 mg  Metformin 500 mg XR with plan to titrate up to max dose as tolerate Add lantus 10 U if CBG still elevated above goal with the following

## 2015-11-12 NOTE — Progress Notes (Signed)
Patient ID: Bailey [name], female   DOB: 02/24/1956, 59 y.o.   MRN: [id number]   Subjective:  Patient ID: Bailey [name], female    DOB: 03/05/1956  Age: 59 y.o. MRN: [id number]  CC: No chief complaint on file.   HPI Bailey [name] presents for    1. CHRONIC DIABETES dx in 07/2015. Take metformin but had cut tab in half due to GI upset.   Disease Monitoring  Blood Sugar Ranges: not checking   Polyuria: no   Visual problems: no   Medication Compliance: no  Medication Side Effects  Hypoglycemia: no   Preventitive Health Care  Eye Exam: due   Foot Exam: done today   Diet pattern: moderate carb, drinks pepsi   Exercise: yes, walking   2. Menopausal symptoms: s/p hysterectomy. Previously on HRT. Would like to see gyn to possible restart HRT. Reports hot flashes and irritable mood.   Past Medical History  Diagnosis Date  . Allergy   . Arthritis     RIGHT shoulder  . Fracture of fourth toe, right, closed   . Diabetes mellitus without complication [place])     Past Surgical History  Procedure Laterality Date  . Rotator cuff repair Right     then redo, then surgery due to frozen shoulder  . Cholecystectomy    . Abdominal hysterectomy      with bilateral oophrectomy    Family History  Problem Relation Age of Onset  . Diabetes Mother   . Hypertension Mother   . Dementia Mother   . Pancreatitis Mother   . Asthma Mother   . COPD Mother   . Stroke Mother   . Hyperlipidemia Mother   . Heart disease Mother   . Hypertension Father   . Hypertension Sister   . Diabetes Sister   . Hyperlipidemia Sister   . Hypertension Brother   . Diabetes Brother   . Drug abuse Brother   . Cancer Brother     colon  . Hyperlipidemia Brother   . Hyperlipidemia Brother   . Hypertension Brother   . Hyperlipidemia Sister   . Hypertension Sister   . Diabetes Sister   . Diabetes Maternal Grandmother     Social History  Substance Use Topics  . Smoking status: Current Every Day  Smoker -- 0.50 packs/day for 28 years  . Smokeless tobacco: Never Used     Comment: thinking about it  . Alcohol Use: No    ROS Review of Systems  Constitutional: Negative for fever and chills.  Eyes: Negative for visual disturbance.  Respiratory: Negative for shortness of breath.   Cardiovascular: Negative for chest pain.  Gastrointestinal: Negative for abdominal pain and blood in stool.  Musculoskeletal: Negative for back pain and arthralgias.  Skin: Negative for rash.  Allergic/Immunologic: Negative for immunocompromised state.  Hematological: Negative for adenopathy. Does not bruise/bleed easily.  Psychiatric/Behavioral: Negative for suicidal ideas and dysphoric mood.    Objective:   Today's Vitals: BP 133/82 mmHg  Pulse 83  Temp(Src) 98.5 F (36.9 C) (Oral)  Resp 16  Ht _0  (1.575 m)  Wt 141 lb (63.957 kg)  BMI 25.78 kg/m2  SpO2 99%  Physical Exam  Constitutional: She is oriented to person, place, and time. She appears well-developed and well-nourished. No distress.  HENT:  Head: Normocephalic and atraumatic.  Cardiovascular: Normal rate, regular rhythm, normal heart sounds and intact distal pulses.   Pulmonary/Chest: Effort normal and breath sounds normal.  Musculoskeletal: She exhibits no edema.  Neurological: She is alert and oriented to person, place, and time.  Skin: Skin is warm and dry. No rash noted.  Psychiatric: She has a normal mood and affect.   Lab Results  Component Value Date   HGBA1C 12.30 11/12/2015   CBG  368  UA: neg ketones, 500 glucose   Treated with 20 U of novolog  Repeat CBG, ordered, but  not done, mistakenly overlooked   Assessment & Plan:   Bailey [name] was seen today for diabetes.  Diagnoses and all orders for this visit:  Uncontrolled type 2 diabetes mellitus with hyperglycemia, without long-term current use of insulin ([place]) -     Ambulatory referral to Ophthalmology -     Discontinue: sitaGLIPtin (JANUVIA) 100 MG tablet;  Take 1 tablet (100 mg total) by mouth daily. -     Discontinue: metFORMIN (GLUCOPHAGE XR) 500 MG 24 hr tablet; Take 1 tablet (500 mg total) by mouth daily with breakfast. -     Blood Glucose Monitoring Suppl (TRUE METRIX METER) W/DEVICE KIT; 1 each by Does not apply route as needed. -     glucose blood (TRUE METRIX BLOOD GLUCOSE TEST) test strip; 1 each by Other route 3 (three) times daily. -     TRUEPLUS LANCETS 28G MISC; 1 each by Does not apply route 3 (three) times daily. -     sitaGLIPtin (JANUVIA) 100 MG tablet; Take 1 tablet (100 mg total) by mouth daily. -     metFORMIN (GLUCOPHAGE XR) 500 MG 24 hr tablet; Take 1 tablet (500 mg total) by mouth daily with breakfast. -     Lipid Panel -     Cancel: Glucose (CBG)  Hyperglycemia -     POCT glycosylated hemoglobin (Hb A1C) -     POCT glucose (manual entry) -     Microalbumin/Creatinine Ratio, Urine -     POCT urinalysis dipstick -     insulin aspart (novoLOG) injection 20 Units; Inject 0.2 mLs (20 Units total) into the skin once. -     POCT glucose (manual entry)  Menopausal symptoms -     Ambulatory referral to Gynecology  Healthcare maintenance -     Ambulatory referral to Gastroenterology  Pain, dental -     Ambulatory referral to Dentistry    Outpatient Encounter Prescriptions as of 11/12/2015  Medication Sig  . [DISCONTINUED] metFORMIN (GLUCOPHAGE) 850 MG tablet Take 1 tablet (850 mg total) by mouth daily with breakfast.  . aspirin-acetaminophen-caffeine (EXCEDRIN MIGRAINE) 250-250-65 MG per tablet Take 1 tablet by mouth every 6 (six) hours as needed for headache.  . Blood Glucose Monitoring Suppl (TRUE METRIX METER) W/DEVICE KIT 1 each by Does not apply route as needed.  Mar[place] Kitchen glucose blood (TRUE METRIX BLOOD GLUCOSE TEST) test strip 1 each by Other route 3 (three) times daily.  . metFORMIN (GLUCOPHAGE XR) 500 MG 24 hr tablet Take 1 tablet (500 mg total) by mouth daily with breakfast.  . Multiple Vitamins-Minerals  (MULTIVITAMIN WITH MINERALS) tablet Take 1 tablet by mouth every other day.   . sitaGLIPtin (JANUVIA) 100 MG tablet Take 1 tablet (100 mg total) by mouth daily.  . TRUEPLUS LANCETS 28G MISC 1 each by Does not apply route 3 (three) times daily.  . [DISCONTINUED] ibuprofen (ADVIL,MOTRIN) 200 MG tablet Take 2 tablets (400 mg total) by mouth every 6 (six) hours as needed for moderate pain. (Patient not taking: Reported on 11/12/2015)  . [DISCONTINUED] metFORMIN (GLUCOPHAGE XR) 500 MG 24 hr tablet Take 1 tablet (  500 mg total) by mouth daily with breakfast.  . [DISCONTINUED] sitaGLIPtin (JANUVIA) 100 MG tablet Take 1 tablet (100 mg total) by mouth daily.  . [EXPIRED] insulin aspart (novoLOG) injection 20 Units    No facility-administered encounter medications on file as of 11/12/2015.    Follow-up: No Follow-up on file.    Boykin Nearing MD

## 2015-11-12 NOTE — Progress Notes (Signed)
Establish Care HFU Rt side hyperglycemia  Tobacco user 1/2 ppday  No suicide thought in the past two weeks

## 2015-11-13 LAB — MICROALBUMIN / CREATININE URINE RATIO
Creatinine, Urine: 115 mg/dL (ref 20–320)
MICROALB UR: 0.3 mg/dL
MICROALB/CREAT RATIO: 3 ug/mg{creat} (ref <30)

## 2015-11-16 ENCOUNTER — Encounter: Payer: Self-pay | Admitting: Obstetrics & Gynecology

## 2015-11-16 DIAGNOSIS — E1169 Type 2 diabetes mellitus with other specified complication: Secondary | ICD-10-CM | POA: Insufficient documentation

## 2015-11-16 DIAGNOSIS — E782 Mixed hyperlipidemia: Secondary | ICD-10-CM

## 2015-11-16 MED ORDER — ATORVASTATIN CALCIUM 40 MG PO TABS: 40.0000 mg | ORAL_TABLET | Freq: Every day | ORAL | Status: DC

## 2015-11-16 NOTE — Addendum Note (Signed)
Addended by: Boykin Nearing on: 11/16/2015 09:37 AM   Modules accepted: Orders

## 2015-11-18 ENCOUNTER — Telehealth: Payer: Self-pay | Admitting: *Deleted

## 2015-11-18 NOTE — Telephone Encounter (Signed)
-----   Message from Boykin Nearing, MD sent at 11/16/2015  9:33 AM EST ----- Normal urine microalbumin Elevated triglycerides and low HDL in setting of diabetes, recommend lipitor 40 mg daily, if you develop muscle aches will adjust dose

## 2015-11-18 NOTE — Telephone Encounter (Signed)
Date of birth verified  By pt  Normal urine micro results given Elevated Triglyceride and HDL  Notified Lipitor 40 mg at [facility]  If develop muscle ache will adjust dose Pt verbalized understanding

## 2015-11-26 ENCOUNTER — Ambulatory Visit: Payer: BLUE CROSS/BLUE SHIELD | Attending: Family Medicine | Admitting: Pharmacist

## 2015-11-26 DIAGNOSIS — E1165 Type 2 diabetes mellitus with hyperglycemia: Secondary | ICD-10-CM | POA: Insufficient documentation

## 2015-11-26 DIAGNOSIS — Z794 Long term (current) use of insulin: Secondary | ICD-10-CM | POA: Insufficient documentation

## 2015-11-26 MED ORDER — METFORMIN HCL ER 500 MG PO TB24: 500.0000 mg | ORAL_TABLET | Freq: Two times a day (BID) | ORAL | Status: DC

## 2015-11-26 NOTE — Patient Instructions (Signed)
Thank you for coming to see me today!  Start taking the Januvia in the morning with breakfast  Start taking the metformin twice a day - 1 pill with breakfast and 1 pill with dinner.

## 2015-11-26 NOTE — Progress Notes (Signed)
S:    Patient arrives in good spirits.  Presents for diabetes follow up.   Patient reports adherence with medications. Current diabetes medications include metformin 500 mg daily and Januvia 100 mg daily.   Patient denies hypoglycemic events.  Patient reported dietary habits: she is working on eating better. She mostly drinks water throughout the day. For breakfast she will eat eggs, bacon, grits, or oatmeal. For lunch and dinner she typically has a meat and two veggies. For snacks she has nuts and fruits.   Patient reported exercise habits: walks but has arthritis and this makes it difficult to walk too far.   Patient denies nocturia.  Patient denies neuropathy. Patient reports chronic visual changes. Patient reports self foot exams.   Patient denies nausea with metformin but reports that she has had it in the past. She reports that she takes her metformin and Januvia separately to decrease the risk of nausea so she takes her Januvia about 2 pm.    O:  Lab Results  Component Value Date   HGBA1C 12.30 11/12/2015    Home fasting CBG: 178 - 286 (mostly <230) 2 hour post-prandial/random CBG: 192 - 309  A/P: Diabetes currently uncontrolled based on A1c of 12.3 and home CBGs.   Patient denies hypoglycemic events and is able to verbalize appropriate hypoglycemia management plan.  Patient reports adherence with medication. Control is suboptimal due to sedentary lifestyle.  Increase metformin to 500 mg BID - will continue to increase to 1000 mg BID as patient tolerates. Will call her in two weeks to see how she is doing and if she continues to deny nausea, will increase metformin to 1000 mg BID at that time. Patient still not willing to consider insulin at this time but told her that we may need to seriously consider it in the future. Also educated patient to take her Januvia first thing in the morning so that it can help to cover her post-prandial blood glucose throughout the day. Patient  verbalized understanding.   Next A1C anticipated February 2017.    Written patient instructions provided.  Total time in face to face counseling 20 minutes.   Follow up with Dr. Adrian Blackwater at the end of the month.

## 2015-12-10 ENCOUNTER — Encounter: Payer: Self-pay | Admitting: Obstetrics & Gynecology

## 2015-12-10 ENCOUNTER — Ambulatory Visit (INDEPENDENT_AMBULATORY_CARE_PROVIDER_SITE_OTHER): Payer: Self-pay | Admitting: Obstetrics & Gynecology

## 2015-12-10 VITALS — BP 125/77 | HR 88 | Temp 98.6°F | Ht 62.0 in

## 2015-12-10 DIAGNOSIS — E8940 Asymptomatic postprocedural ovarian failure: Secondary | ICD-10-CM

## 2015-12-10 DIAGNOSIS — N958 Other specified menopausal and perimenopausal disorders: Secondary | ICD-10-CM

## 2015-12-10 MED ORDER — ESTRADIOL 0.025 MG/24HR TD PTTW: 1.0000 | MEDICATED_PATCH | TRANSDERMAL | Status: DC

## 2015-12-10 NOTE — Progress Notes (Signed)
Patient ID: Bailey [name], female   DOB: September 30, 1956, 59 y.o.   MRN: [id number]  Chief Complaint  Patient presents with  . Menopausal Symptoms    hot flashes, night sweats, mood swings    HPI Bailey [name] is a 59 y.o. female.  No obstetric history on file. S/p TAH/BSO 1995, she had ERT for years but her Gyn died and she could get no refills.   HPI  Chief Complaint  Patient presents with  . Menopausal Symptoms    hot flashes, night sweats, mood swings    Past Medical History  Diagnosis Date  . Allergy   . Arthritis     RIGHT shoulder  . Fracture of fourth toe, right, closed   . Diabetes mellitus without complication [facility])     Past Surgical History  Procedure Laterality Date  . Rotator cuff repair Right     then redo, then surgery due to frozen shoulder  . Cholecystectomy    . Abdominal hysterectomy  1998    with bilateral oophrectomy, PID     Family History  Problem Relation Age of Onset  . Diabetes Mother   . Hypertension Mother   . Dementia Mother   . Pancreatitis Mother   . Asthma Mother   . COPD Mother   . Stroke Mother   . Hyperlipidemia Mother   . Heart disease Mother   . Hypertension Father   . Hypertension Sister   . Diabetes Sister   . Hyperlipidemia Sister   . Hypertension Brother   . Diabetes Brother   . Drug abuse Brother   . Cancer Brother     colon  . Hyperlipidemia Brother   . Hyperlipidemia Brother   . Hypertension Brother   . Hyperlipidemia Sister   . Hypertension Sister   . Diabetes Sister   . Diabetes Maternal Grandmother     Social History Social History  Substance Use Topics  . Smoking status: Current Every Day Smoker -- 0.50 packs/day for 28 years  . Smokeless tobacco: Never Used     Comment: thinking about it  . Alcohol Use: No    Allergies  Allergen Reactions  . Ivp Dye [Iodinated Diagnostic Agents] Hives  . Codeine Other (See Comments)    Unknown childhood allergy  . Latex Other (See Comments)    blisters   . Penicillins Other (See Comments)    Passed out    Current Outpatient Prescriptions  Medication Sig Dispense Refill  . aspirin-acetaminophen-caffeine (EXCEDRIN MIGRAINE) 250-250-65 MG per tablet Take 1 tablet by mouth every 6 (six) hours as needed for headache.    Marland Kitchen atorvastatin (LIPITOR) 40 MG tablet Take 1 tablet (40 mg total) by mouth daily. 30 tablet 11  . Blood Glucose Monitoring Suppl (TRUE METRIX METER) W/DEVICE KIT 1 each by Does not apply route as needed. 1 kit 0  . glucose blood (TRUE METRIX BLOOD GLUCOSE TEST) test strip 1 each by Other route 3 (three) times daily. 100 each 11  . metFORMIN (GLUCOPHAGE XR) 500 MG 24 hr tablet Take 1 tablet (500 mg total) by mouth 2 (two) times daily with a meal. 30 tablet 5  . Multiple Vitamins-Minerals (MULTIVITAMIN WITH MINERALS) tablet Take 1 tablet by mouth every other day.     . sitaGLIPtin (JANUVIA) 100 MG tablet Take 1 tablet (100 mg total) by mouth daily. 30 tablet 3  . TRUEPLUS LANCETS 28G MISC 1 each by Does not apply route 3 (three) times daily. 100 each 11  .  estradiol (VIVELLE-DOT) 0.025 MG/24HR Place 1 patch onto the skin 2 (two) times a week. 8 patch 12  . [DISCONTINUED] albuterol (PROVENTIL HFA;VENTOLIN HFA) 108 (90 BASE) MCG/ACT inhaler Inhale 2 puffs into the lungs every 4 (four) hours as needed for wheezing (cough). (Patient not taking: Reported on 08/22/2015) 1 Inhaler 0   No current facility-administered medications for this visit.    Review of Systems Review of Systems  Constitutional:       VMS sweats and hot flushes  Respiratory: Negative.   Cardiovascular: Negative.   Endocrine: Positive for heat intolerance.  Genitourinary: Negative.   Psychiatric/Behavioral:       Mood swings    Blood pressure 125/77, pulse 88, temperature 98.6 F (37 C), temperature source Oral, height _0  (1.575 m).  Physical Exam Physical Exam  Constitutional: She is oriented to person, place, and time. She appears well-developed. No  distress.  Pulmonary/Chest: Effort normal.  Neurological: She is alert and oriented to person, place, and time.  Skin: Skin is warm and dry.  Psychiatric: She has a normal mood and affect. Her behavior is normal.    Data Reviewed Meds, allergies  Assessment    Postmenopausal VMS previously controlled with estrogen patch twice a week     Plan    Vivelle dot 0.025 mg/day RTC for refill   Mammogram scholarship to repeat screening as recommended       Bailey [name] 12/10/2015, 3:13 PM

## 2015-12-10 NOTE — Patient Instructions (Signed)
Hormone Therapy At menopause, your body begins making less estrogen and progesterone hormones. This causes the body to stop having menstrual periods. This is because estrogen and progesterone hormones control your periods and menstrual cycle. A lack of estrogen may cause symptoms such as:  Hot flushes (or hot flashes).  Vaginal dryness.  Dry skin.  Loss of sex drive.  Risk of bone loss (osteoporosis). When this happens, you may choose to take hormone therapy to get back the estrogen lost during menopause. When the hormone estrogen is given alone, it is usually referred to as ET (Estrogen Therapy). When the hormone progestin is combined with estrogen, it is generally called HT (Hormone Therapy). This was formerly known as hormone replacement therapy (HRT). Your caregiver can help you make a decision on what will be best for you. The decision to use HT seems to change often as new studies are done. Many studies do not agree on the benefits of hormone replacement therapy. LIKELY BENEFITS OF HT INCLUDE PROTECTION FROM:  Hot Flushes (also called hot flashes) - A hot flush is a sudden feeling of heat that spreads over the face and body. The skin may redden like a blush. It is connected with sweats and sleep disturbance. Women going through menopause may have hot flushes a few times a month or several times per day depending on the woman.  Osteoporosis (bone loss) - Estrogen helps guard against bone loss. After menopause, a woman's bones slowly lose calcium and become weak and brittle. As a result, bones are more likely to break. The hip, wrist, and spine are affected most often. Hormone therapy can help slow bone loss after menopause. Weight bearing exercise and taking calcium with vitamin D also can help prevent bone loss. There are also medications that your caregiver can prescribe that can help prevent osteoporosis.  Vaginal dryness - Loss of estrogen causes changes in the vagina. Its lining may  become thin and dry. These changes can cause pain and bleeding during sexual intercourse. Dryness can also lead to infections. This can cause burning and itching. (Vaginal estrogen treatment can help relieve pain, itching, and dryness.)  Urinary tract infections are more common after menopause because of lack of estrogen. Some women also develop urinary incontinence because of low estrogen levels in the vagina and bladder.  Possible other benefits of estrogen include a positive effect on mood and short-term memory in women. RISKS AND COMPLICATIONS  Using estrogen alone without progesterone causes the lining of the uterus to grow. This increases the risk of lining of the uterus (endometrial) cancer. Your caregiver should give another hormone called progestin if you have a uterus.  Women who take combined (estrogen and progestin) HT appear to have an increased risk of breast cancer. The risk appears to be small, but increases throughout the time that HT is taken.  Combined therapy also makes the breast tissue slightly denser which makes it harder to read mammograms (breast X-rays).  Combined, estrogen and progesterone therapy can be taken together every day, in which case there may be spotting of blood. HT therapy can be taken cyclically in which case you will have menstrual periods. Cyclically means HT is taken for a set amount of days, then not taken, then this process is repeated.  HT may increase the risk of stroke, heart attack, breast cancer and forming blood clots in your leg.  Transdermal estrogen (estrogen that is absorbed through the skin with a patch or a cream) may have better results with:  Cholesterol.  Blood pressure.  Blood clots. Having the following conditions may indicate you should not have HT:  Endometrial cancer.  Liver disease.  Breast cancer.  Heart disease.  History of blood clots.  Stroke. TREATMENT   If you choose to take HT and have a uterus, usually  estrogen and progestin are prescribed.  Your caregiver will help you decide the best way to take the medications.  Possible ways to take estrogen include:  Pills.  Patches.  Gels.  Sprays.  Vaginal estrogen cream, rings and tablets.  It is best to take the lowest dose possible that will help your symptoms and take them for the shortest period of time that you can.  Hormone therapy can help relieve some of the problems (symptoms) that affect women at menopause. Before making a decision about HT, talk to your caregiver about what is best for you. Be well informed and comfortable with your decisions. HOME CARE INSTRUCTIONS   Follow your caregivers advice when taking the medications.  A Pap test is done to screen for cervical cancer.  The first Pap test should be done at age 34.  Between ages 80 and 52, Pap tests are repeated every 2 years.  Beginning at age 13, you are advised to have a Pap test every 3 years as long as the past 3 Pap tests have been normal.  Some women have medical problems that increase the chance of getting cervical cancer. Talk to your caregiver about these problems. It is especially important to talk to your caregiver if a new problem develops soon after your last Pap test. In these cases, your caregiver may recommend more frequent screening and Pap tests.  The above recommendations are the same for women who have or have not gotten the vaccine for HPV (human papillomavirus).  If you had a hysterectomy for a problem that was not a cancer or a condition that could lead to cancer, then you no longer need Pap tests. However, even if you no longer need a Pap test, a regular exam is a good idea to make sure no other problems are starting.  If you are between ages 20 and 60, and you have had normal Pap tests going back 10 years, you no longer need Pap tests. However, even if you no longer need a Pap test, a regular exam is a good idea to make sure no other problems  are starting.  If you have had past treatment for cervical cancer or a condition that could lead to cancer, you need Pap tests and screening for cancer for at least 20 years after your treatment.  If Pap tests have been discontinued, risk factors (such as a new sexual partner)need to be re-assessed to determine if screening should be resumed.  Some women may need screenings more often if they are at high risk for cervical cancer.  Get mammograms done as per the advice of your caregiver. SEEK IMMEDIATE MEDICAL CARE IF:  You develop abnormal vaginal bleeding.  You have pain or swelling in your legs, shortness of breath, or chest pain.  You develop dizziness or headaches.  You have lumps or changes in your breasts or armpits.  You have slurred speech.  You develop weakness or numbness of your arms or legs.  You have pain, burning, or bleeding when urinating.  You develop abdominal pain.   This information is not intended to replace advice given to you by your health care provider. Make sure you discuss any questions  you have with your health care provider.   Document Released: 09/10/2003 Document Revised: 04/28/2015 Document Reviewed: 06/15/2015 Elsevier Interactive Patient Education 2016 [employer]  

## 2015-12-10 NOTE — Progress Notes (Signed)
Mammogram Scholarship faxed to [place].

## 2015-12-14 ENCOUNTER — Other Ambulatory Visit: Payer: Self-pay | Admitting: Family Medicine

## 2015-12-14 DIAGNOSIS — Z1231 Encounter for screening mammogram for malignant neoplasm of breast: Secondary | ICD-10-CM

## 2015-12-15 ENCOUNTER — Other Ambulatory Visit: Payer: Self-pay | Admitting: *Deleted

## 2015-12-15 DIAGNOSIS — E1165 Type 2 diabetes mellitus with hyperglycemia: Secondary | ICD-10-CM

## 2015-12-15 MED ORDER — SITAGLIPTIN PHOSPHATE 100 MG PO TABS: 100.0000 mg | ORAL_TABLET | Freq: Every day | ORAL | Status: DC

## 2015-12-16 ENCOUNTER — Ambulatory Visit
Admission: RE | Admit: 2015-12-16 | Discharge: 2015-12-16 | Disposition: A | Discharge status: Home / Self Care | Payer: No Typology Code available for payment source | Source: Ambulatory Visit | Attending: Family Medicine | Admitting: Family Medicine

## 2015-12-16 DIAGNOSIS — Z1231 Encounter for screening mammogram for malignant neoplasm of breast: Secondary | ICD-10-CM

## 2015-12-24 ENCOUNTER — Ambulatory Visit: Payer: Self-pay | Attending: Family Medicine | Admitting: Family Medicine

## 2015-12-24 ENCOUNTER — Encounter: Payer: Self-pay | Admitting: Family Medicine

## 2015-12-24 VITALS — BP 130/79 | HR 73 | Temp 97.9°F | Resp 16 | Ht 62.0 in | Wt 142.0 lb

## 2015-12-24 DIAGNOSIS — E1169 Type 2 diabetes mellitus with other specified complication: Secondary | ICD-10-CM

## 2015-12-24 DIAGNOSIS — E1165 Type 2 diabetes mellitus with hyperglycemia: Secondary | ICD-10-CM

## 2015-12-24 DIAGNOSIS — E782 Mixed hyperlipidemia: Principal | ICD-10-CM

## 2015-12-24 DIAGNOSIS — M6289 Other specified disorders of muscle: Secondary | ICD-10-CM

## 2015-12-24 DIAGNOSIS — R531 Weakness: Secondary | ICD-10-CM | POA: Insufficient documentation

## 2015-12-24 DIAGNOSIS — H109 Unspecified conjunctivitis: Secondary | ICD-10-CM | POA: Insufficient documentation

## 2015-12-24 DIAGNOSIS — E781 Pure hyperglyceridemia: Secondary | ICD-10-CM

## 2015-12-24 DIAGNOSIS — E786 Lipoprotein deficiency: Secondary | ICD-10-CM

## 2015-12-24 LAB — GLUCOSE, POCT (MANUAL RESULT ENTRY): POC Glucose: 144 mg/dl — AB (ref 70–99)

## 2015-12-24 MED ORDER — TOBRAMYCIN-DEXAMETHASONE 0.3-0.1 % OP SUSP: 1.0000 [drp] | OPHTHALMIC | Status: DC

## 2015-12-24 MED ORDER — POLYMYXIN B-TRIMETHOPRIM 10000-0.1 UNIT/ML-% OP SOLN: 1.0000 [drp] | OPHTHALMIC | Status: DC

## 2015-12-24 MED ORDER — GLUCOSE BLOOD VI STRP: 1.0000 | ORAL_STRIP | Freq: Three times a day (TID) | Status: DC

## 2015-12-24 NOTE — Patient Instructions (Addendum)
Bailey [name] was seen today for diabetes and eye pain.  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  Right sided weakness -     Ambulatory referral to Neurology  Bilateral conjunctivitis -     Discontinue: tobramycin-dexamethasone (TOBRADEX) ophthalmic solution; Place 1 drop into both eyes every 4 (four) hours while awake. -     trimethoprim-polymyxin b (POLYTRIM) ophthalmic solution; Place 1 drop into the left eye every 4 (four) hours.   F/u in 2 months for diabetes, A1c check  Dr. Adrian Blackwater

## 2015-12-24 NOTE — Progress Notes (Signed)
Patient ID: Bailey [name], female   DOB: 04/12/1956, 59 y.o.   MRN: [id number]   Subjective:  Patient ID: Bailey [name], female    DOB: Jul 07, 1956  Age: 59 y.o. MRN: [id number]  CC: Diabetes   HPI Bailey [name] presents for    1. CHRONIC DIABETES  Disease Monitoring  Blood Sugar Ranges: 99-180  Polyuria: no   Visual problems: no   Medication Compliance: yes  Medication Side Effects  Hypoglycemia: no   Preventitive Health Care  Eye Exam: due   Foot Exam: done recently   Diet pattern: low sugar   Exercise: minimal  2. Eye pain: L eye with pain and discharge last week. Along with redness. Improved drastically. Still with slight gritty sensation in L eye. She also has some pain in her R eye. No fever or chills. She treated herself at home with visine and warm compresses.   3. R sided weakness: comes and goes. Lower extremity mostly. She has been referred to neurology in past but did not complete appointment. Has a near fall one week ago. Denies pain in R low back, hip or knee. She is requesting another referral to the neurologist.   Social History  Substance Use Topics  . Smoking status: Current Every Day Smoker -- 0.50 packs/day for 28 years  . Smokeless tobacco: Never Used     Comment: thinking about it  . Alcohol Use: No    Outpatient Prescriptions Prior to Visit  Medication Sig Dispense Refill  . aspirin-acetaminophen-caffeine (EXCEDRIN MIGRAINE) 250-250-65 MG per tablet Take 1 tablet by mouth every 6 (six) hours as needed for headache.    Marland Kitchen atorvastatin (LIPITOR) 40 MG tablet Take 1 tablet (40 mg total) by mouth daily. 30 tablet 11  . Blood Glucose Monitoring Suppl (TRUE METRIX METER) W/DEVICE KIT 1 each by Does not apply route as needed. 1 kit 0  . estradiol (VIVELLE-DOT) 0.025 MG/24HR Place 1 patch onto the skin 2 (two) times a week. 8 patch 12  . glucose blood (TRUE METRIX BLOOD GLUCOSE TEST) test strip 1 each by Other route 3 (three) times daily. 100 each  11  . metFORMIN (GLUCOPHAGE XR) 500 MG 24 hr tablet Take 1 tablet (500 mg total) by mouth 2 (two) times daily with a meal. 30 tablet 5  . Multiple Vitamins-Minerals (MULTIVITAMIN WITH MINERALS) tablet Take 1 tablet by mouth every other day.     . sitaGLIPtin (JANUVIA) 100 MG tablet Take 1 tablet (100 mg total) by mouth daily. 30 tablet 3  . TRUEPLUS LANCETS 28G MISC 1 each by Does not apply route 3 (three) times daily. 100 each 11   No facility-administered medications prior to visit.    ROS Review of Systems  Constitutional: Negative for fever and chills.  Eyes: Positive for pain and discharge. Negative for visual disturbance.  Respiratory: Negative for shortness of breath.   Cardiovascular: Negative for chest pain.  Gastrointestinal: Negative for abdominal pain and blood in stool.  Musculoskeletal: Negative for back pain and arthralgias.  Skin: Negative for rash.  Allergic/Immunologic: Negative for immunocompromised state.  Neurological: Positive for weakness. Tremors: R sided   Hematological: Negative for adenopathy. Does not bruise/bleed easily.  Psychiatric/Behavioral: Negative for suicidal ideas and dysphoric mood.    Objective:  BP 130/79 mmHg  Pulse 73  Temp(Src) 97.9 F (36.6 C) (Oral)  Resp 16  Ht _0  (1.575 m)  Wt 142 lb (64.411 kg)  BMI 25.97 kg/m2  SpO2 100%  BP/Weight 12/24/2015  12/10/2015 58/59/2924  Systolic BP 462 863 817  Diastolic BP 79 77 82  Wt. (Lbs) 142 - 141  BMI 25.97 - 25.78    Physical Exam  Constitutional: She is oriented to person, place, and time. She appears well-developed and well-nourished. No distress.  HENT:  Head: Normocephalic and atraumatic.  Eyes: Conjunctivae and EOM are normal. Pupils are equal, round, and reactive to light. Right eye exhibits no discharge and no exudate. No foreign body present in the right eye. Left eye exhibits no discharge and no exudate. No foreign body present in the left eye.  Cardiovascular: Normal  rate, regular rhythm, normal heart sounds and intact distal pulses.   Pulmonary/Chest: Effort normal and breath sounds normal.  Musculoskeletal: She exhibits no edema.  Neurological: She is alert and oriented to person, place, and time.  Skin: Skin is warm and dry. No rash noted.  Psychiatric: She has a normal mood and affect.    Lab Results  Component Value Date   HGBA1C 12.30 11/12/2015   CBG 144 Assessment & Plan:   Problem List Items Addressed This Visit    Bilateral conjunctivitis    Improving conjunctivitis. 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    Follow-up: No Follow-up on file.   Boykin Nearing MD

## 2015-12-24 NOTE — Assessment & Plan Note (Signed)
Improving conjunctivitis. Viral etiology most likely  3 days of polymyxin drops to completely resolve

## 2015-12-24 NOTE — Progress Notes (Signed)
F/U DM C/C eye infection, eye itching and pain  Pain scale #4 Taking medication as prescribed  Glucose running 99-218 Tobacco user 6 cigarette per day  No suicidal though in the past two weeks

## 2015-12-24 NOTE — Assessment & Plan Note (Signed)
A: improving control P: Continue current regimen F/u in 2 months for A1c

## 2015-12-30 ENCOUNTER — Other Ambulatory Visit: Payer: Self-pay

## 2015-12-30 DIAGNOSIS — E1165 Type 2 diabetes mellitus with hyperglycemia: Secondary | ICD-10-CM

## 2015-12-30 MED ORDER — SITAGLIPTIN PHOSPHATE 100 MG PO TABS: 100.0000 mg | ORAL_TABLET | Freq: Every day | ORAL | Status: DC

## 2016-01-05 ENCOUNTER — Other Ambulatory Visit: Payer: Self-pay | Admitting: *Deleted

## 2016-01-05 DIAGNOSIS — E1165 Type 2 diabetes mellitus with hyperglycemia: Secondary | ICD-10-CM

## 2016-01-11 MED FILL — ?ATORVASTATIN 40MG TABLET: 40 | 30 days supply | Qty: 30 | Fill #1

## 2016-01-11 MED FILL — JANUVIA 100 MG TABLET: 100 | 30 days supply | Qty: 30 | Fill #2

## 2016-01-11 MED FILL — ESTRADIOL 0.025 MG PATCH: 0.025 | 30 days supply | Qty: 8 | Fill #1

## 2016-01-11 MED FILL — METFORMIN HCL ER 500 MG TAB: 500 | 30 days supply | Qty: 60 | Fill #0

## 2016-02-10 ENCOUNTER — Ambulatory Visit: Payer: Self-pay | Admitting: Neurology

## 2016-02-18 MED FILL — JANUVIA 100 MG TABLET: 100 | 30 days supply | Qty: 30 | Fill #3

## 2016-02-18 MED FILL — ATORVASTATIN 40 MG TABLET: 40 | 30 days supply | Qty: 30 | Fill #2

## 2016-02-18 MED FILL — TRUE METRIX TEST STRIP: 33 days supply | Qty: 100 | Fill #1

## 2016-02-18 MED FILL — METFORMIN HCL ER 500 MG TAB: 500 | 30 days supply | Qty: 60 | Fill #1

## 2016-02-18 MED FILL — ESTRADIOL 0.025 MG PATCH: 0.025 | 30 days supply | Qty: 8 | Fill #2

## 2016-03-08 ENCOUNTER — Encounter: Payer: Self-pay | Admitting: Family Medicine

## 2016-03-08 ENCOUNTER — Ambulatory Visit: Payer: Self-pay | Attending: Family Medicine | Admitting: Family Medicine

## 2016-03-08 VITALS — BP 142/75 | HR 69 | Temp 98.0°F | Resp 16 | Ht 62.0 in | Wt 143.0 lb

## 2016-03-08 DIAGNOSIS — E1165 Type 2 diabetes mellitus with hyperglycemia: Secondary | ICD-10-CM

## 2016-03-08 LAB — POCT GLYCOSYLATED HEMOGLOBIN (HGB A1C): HEMOGLOBIN A1C: 8.7

## 2016-03-08 LAB — GLUCOSE, POCT (MANUAL RESULT ENTRY): POC Glucose: 151 mg/dl — AB (ref 70–99)

## 2016-03-08 MED ORDER — METFORMIN HCL ER 500 MG PO TB24: 1000.0000 mg | ORAL_TABLET | ORAL | Status: DC

## 2016-03-08 NOTE — Progress Notes (Signed)
F/U DM  Taking medication daily as prescribed  Glucose running 95-200 Tobacco user 5 cigarette per day  No suicidal thoughts in the past two week Pain scale # 5 - arthritis

## 2016-03-08 NOTE — Patient Instructions (Signed)
Chatherine was seen today for diabetes.  Diagnoses and all orders for this visit:  Uncontrolled type 2 diabetes mellitus with hyperglycemia, without long-term current use of insulin (HCC) -     HgB A1c -     Glucose (CBG) -     metFORMIN (GLUCOPHAGE XR) 500 MG 24 hr tablet; Take 2 tablets (1,000 mg total) by mouth every morning.   Diabetes blood sugar goals  Fasting (in AM before breakfast, 8 hrs of no eating or drinking (except water or unsweetened coffee or tea): 90-110 2 hrs after meals: < 160,   No low sugars: nothing < 70   Great job getting your A1c down   F/u in 3 months for diabetes   Dr. Adrian Blackwater

## 2016-03-08 NOTE — Progress Notes (Signed)
Subjective:  Patient ID: Bailey [name], female    DOB: Aug 29, 1956  Age: 60 y.o. MRN: [id number]  CC: Diabetes   HPI Bailey [name] presents for    1. CHRONIC DIABETES  Disease Monitoring  Blood Sugar Ranges: 95-201  Polyuria: no   Visual problems: no   Medication Compliance: yes  Medication Side Effects  Hypoglycemia: no   Preventitive Health Care  Eye Exam: due   Foot Exam: done today   Diet pattern: regular meals   Exercise: walking some days    Social History  Substance Use Topics  . Smoking status: Current Every Day Smoker -- 0.50 packs/day for 28 years  . Smokeless tobacco: Never Used     Comment: thinking about it  . Alcohol Use: No   Outpatient Prescriptions Prior to Visit  Medication Sig Dispense Refill  . aspirin-acetaminophen-caffeine (EXCEDRIN MIGRAINE) 250-250-65 MG per tablet Take 1 tablet by mouth every 6 (six) hours as needed for headache.    Marland Kitchen atorvastatin (LIPITOR) 40 MG tablet Take 1 tablet (40 mg total) by mouth daily. 30 tablet 11  . Blood Glucose Monitoring Suppl (TRUE METRIX METER) W/DEVICE KIT 1 each by Does not apply route as needed. 1 kit 0  . estradiol (VIVELLE-DOT) 0.025 MG/24HR Place 1 patch onto the skin 2 (two) times a week. 8 patch 12  . glucose blood (TRUE METRIX BLOOD GLUCOSE TEST) test strip 1 each by Other route 3 (three) times daily. 100 each 11  . Multiple Vitamins-Minerals (MULTIVITAMIN WITH MINERALS) tablet Take 1 tablet by mouth every other day.     . sitaGLIPtin (JANUVIA) 100 MG tablet Take 1 tablet (100 mg total) by mouth daily. 30 tablet 3  . TRUEPLUS LANCETS 28G MISC 1 each by Does not apply route 3 (three) times daily. 100 each 11  . metFORMIN (GLUCOPHAGE XR) 500 MG 24 hr tablet Take 1 tablet (500 mg total) by mouth 2 (two) times daily with a meal. 30 tablet 5  . trimethoprim-polymyxin b (POLYTRIM) ophthalmic solution Place 1 drop into the left eye every 4 (four) hours. 10 mL 0   No facility-administered medications  prior to visit.    ROS Review of Systems  Constitutional: Negative for fever and chills.  Eyes: Negative for visual disturbance.  Respiratory: Negative for shortness of breath.   Cardiovascular: Negative for chest pain.  Gastrointestinal: Negative for abdominal pain and blood in stool.  Musculoskeletal: Negative for back pain and arthralgias.  Skin: Negative for rash.  Allergic/Immunologic: Negative for immunocompromised state.  Hematological: Negative for adenopathy. Does not bruise/bleed easily.  Psychiatric/Behavioral: Negative for suicidal ideas and dysphoric mood.    Objective:  BP 142/75 mmHg  Pulse 69  Temp(Src) 98 F (36.7 C) (Oral)  Resp 16  Ht '5\' 2"'  (1.575 m)  Wt 143 lb (64.864 kg)  BMI 26.15 kg/m2  SpO2 100%  BP/Weight 03/08/2016 12/24/2015 71/05/2693  Systolic BP 854 627 035  Diastolic BP 75 79 77  Wt. (Lbs) 143 142 -  BMI 26.15 25.97 -    Physical Exam  Constitutional: She is oriented to person, place, and time. She appears well-developed and well-nourished. No distress.  HENT:  Head: Normocephalic and atraumatic.  Cardiovascular: Normal rate, regular rhythm, normal heart sounds and intact distal pulses.   Pulmonary/Chest: Effort normal and breath sounds normal.  Musculoskeletal: She exhibits no edema.  Neurological: She is alert and oriented to person, place, and time.  Skin: Skin is warm and dry. No rash noted.  Psychiatric: She has a normal mood and affect.    Lab Results  Component Value Date   HGBA1C 8.7 03/08/2016   CBG 151 Assessment & Plan:   Namita was seen today for diabetes.  Diagnoses and all orders for this visit:  Uncontrolled type 2 diabetes mellitus with hyperglycemia, without long-term current use of insulin (HCC) -     HgB A1c -     Glucose (CBG) -     metFORMIN (GLUCOPHAGE XR) 500 MG 24 hr tablet; Take 2 tablets (1,000 mg total) by mouth every morning.    No orders of the defined types were placed in this encounter.     Follow-up: No Follow-up on file.   Boykin Nearing MD

## 2016-03-08 NOTE — Assessment & Plan Note (Addendum)
A: improved diabetes with A1c from 12.3 to 8.7 P: Continue januvia 1000 mg daily Continue metformin 1000 mg daily with plan to increase as tolerated Consider adding SGLT if A1c above goal of max tolerated dose of metformin  Increase exercise Goal A1c is < 7

## 2016-03-09 ENCOUNTER — Encounter: Payer: Self-pay | Admitting: Clinical

## 2016-03-09 NOTE — Progress Notes (Signed)
Depression screen [facility] 2/9 03/08/2016 12/24/2015 11/12/2015  Decreased Interest 0 1 1  Down, Depressed, Hopeless 0 1 2  PHQ - 2 Score 0 2 3  Altered sleeping 0 2 2  Tired, decreased energy 1 1 2   Change in appetite 0 0 1  Feeling bad or failure about yourself  0 0 0  Trouble concentrating 0 0 1  Moving slowly or fidgety/restless 0 0 0  Suicidal thoughts 0 0 0  PHQ-9 Score 1 5 9     GAD 7 : Generalized Anxiety Score 03/08/2016 12/24/2015 11/12/2015  Nervous, Anxious, on Edge 0 0 0  Control/stop worrying 0 1 1  Worry too much - different things 1 0 1  Trouble relaxing 0 1 0  Restless 0 0 0  Easily annoyed or irritable 0 0 0  Afraid - awful might happen 0 0 0  Total GAD 7 Score 1 2 2      \

## 2016-03-30 MED FILL — ATORVASTATIN 40 MG TABLET: 40 | 30 days supply | Qty: 30 | Fill #3

## 2016-03-30 MED FILL — ESTRADIOL 0.025 MG PATCH: 0.025 | 30 days supply | Qty: 8 | Fill #3

## 2016-03-30 MED FILL — METFORMIN HCL ER 500 MG TAB: 500 | 30 days supply | Qty: 60 | Fill #2

## 2016-04-01 MED FILL — !JANUVIA 100MG TABLET: 100 | 30 days supply | Qty: 30 | Fill #0

## 2016-05-02 ENCOUNTER — Telehealth: Payer: Self-pay | Admitting: Family Medicine

## 2016-05-02 DIAGNOSIS — Z0000 Encounter for general adult medical examination without abnormal findings: Secondary | ICD-10-CM

## 2016-05-02 MED FILL — JANUVIA 100 MG TABLET: 100 | 30 days supply | Qty: 30 | Fill #1

## 2016-05-02 MED FILL — METFORMIN HCL ER 500 MG TAB: 500 | 30 days supply | Qty: 60 | Fill #0

## 2016-05-02 MED FILL — VIVELLE-DOT 0.025 MG PATCH: 0.025 | 30 days supply | Qty: 8 | Fill #4

## 2016-05-02 MED FILL — ATORVASTATIN 40 MG TABLET: 40 | 30 days supply | Qty: 30 | Fill #4

## 2016-05-02 NOTE — Telephone Encounter (Signed)
GI referral placed

## 2016-05-02 NOTE — Telephone Encounter (Signed)
Pt. Came in request a referral to have a colonoscopy done. Please f/u

## 2016-05-04 ENCOUNTER — Telehealth: Payer: Self-pay | Admitting: Family Medicine

## 2016-05-04 DIAGNOSIS — R531 Weakness: Secondary | ICD-10-CM

## 2016-05-04 NOTE — Telephone Encounter (Signed)
Pt. Came in requesting to be referred out to the Neurologist. Please f/u

## 2016-05-04 NOTE — Telephone Encounter (Signed)
Patient was referred in December, 2016 and canceled appointment per notes. For what reason is she requesting the referral?

## 2016-05-09 NOTE — Telephone Encounter (Signed)
Pt. Returned call. Please f/u with pt. °

## 2016-05-09 NOTE — Telephone Encounter (Signed)
LVM to return call.

## 2016-05-11 NOTE — Telephone Encounter (Signed)
Pt stated Still with leg problems Neurology Appointment was cancelled due to cold Sx and was not reschedule

## 2016-05-11 NOTE — Telephone Encounter (Signed)
LVM to return call.

## 2016-05-12 NOTE — Telephone Encounter (Signed)
Noted neurology referral placed

## 2016-05-26 ENCOUNTER — Telehealth: Payer: Self-pay | Admitting: Neurology

## 2016-05-26 NOTE — Telephone Encounter (Signed)
Rec'd from [facility] forward 37 pages to Dr. Posey Pronto

## 2016-05-30 MED FILL — JANUVIA 100 MG TABLET: 100 | 30 days supply | Qty: 30 | Fill #2

## 2016-05-30 MED FILL — VIVELLE-DOT 0.025 MG PATCH: 0.025 | 30 days supply | Qty: 8 | Fill #5

## 2016-05-30 MED FILL — ATORVASTATIN 40 MG TABLET: 40 | 30 days supply | Qty: 30 | Fill #5

## 2016-05-30 MED FILL — METFORMIN HCL ER 500 MG TAB: 500 | 30 days supply | Qty: 60 | Fill #1

## 2016-06-10 ENCOUNTER — Ambulatory Visit: Payer: Medicaid Other | Attending: Family Medicine | Admitting: Family Medicine

## 2016-06-10 ENCOUNTER — Encounter: Payer: Self-pay | Admitting: Family Medicine

## 2016-06-10 VITALS — BP 136/72 | HR 66 | Temp 98.5°F | Resp 16 | Ht 62.0 in | Wt 144.0 lb

## 2016-06-10 DIAGNOSIS — M720 Palmar fascial fibromatosis [Dupuytren]: Secondary | ICD-10-CM | POA: Diagnosis not present

## 2016-06-10 DIAGNOSIS — M1990 Unspecified osteoarthritis, unspecified site: Secondary | ICD-10-CM

## 2016-06-10 DIAGNOSIS — M13861 Other specified arthritis, right knee: Secondary | ICD-10-CM | POA: Diagnosis not present

## 2016-06-10 DIAGNOSIS — M13862 Other specified arthritis, left knee: Secondary | ICD-10-CM | POA: Diagnosis not present

## 2016-06-10 DIAGNOSIS — Z23 Encounter for immunization: Secondary | ICD-10-CM | POA: Insufficient documentation

## 2016-06-10 DIAGNOSIS — M549 Dorsalgia, unspecified: Secondary | ICD-10-CM | POA: Insufficient documentation

## 2016-06-10 DIAGNOSIS — Z7984 Long term (current) use of oral hypoglycemic drugs: Secondary | ICD-10-CM | POA: Diagnosis not present

## 2016-06-10 DIAGNOSIS — M13811 Other specified arthritis, right shoulder: Secondary | ICD-10-CM | POA: Insufficient documentation

## 2016-06-10 DIAGNOSIS — Z79899 Other long term (current) drug therapy: Secondary | ICD-10-CM | POA: Diagnosis not present

## 2016-06-10 DIAGNOSIS — R197 Diarrhea, unspecified: Secondary | ICD-10-CM | POA: Diagnosis not present

## 2016-06-10 DIAGNOSIS — F17210 Nicotine dependence, cigarettes, uncomplicated: Secondary | ICD-10-CM | POA: Insufficient documentation

## 2016-06-10 DIAGNOSIS — E1165 Type 2 diabetes mellitus with hyperglycemia: Secondary | ICD-10-CM | POA: Diagnosis not present

## 2016-06-10 DIAGNOSIS — Z114 Encounter for screening for human immunodeficiency virus [HIV]: Secondary | ICD-10-CM | POA: Insufficient documentation

## 2016-06-10 DIAGNOSIS — Z7982 Long term (current) use of aspirin: Secondary | ICD-10-CM | POA: Diagnosis not present

## 2016-06-10 DIAGNOSIS — Z0000 Encounter for general adult medical examination without abnormal findings: Secondary | ICD-10-CM

## 2016-06-10 DIAGNOSIS — E119 Type 2 diabetes mellitus without complications: Secondary | ICD-10-CM | POA: Diagnosis present

## 2016-06-10 DIAGNOSIS — M13812 Other specified arthritis, left shoulder: Secondary | ICD-10-CM | POA: Diagnosis not present

## 2016-06-10 LAB — POCT GLYCOSYLATED HEMOGLOBIN (HGB A1C): Hemoglobin A1C: 9.5

## 2016-06-10 LAB — LDL CHOLESTEROL, DIRECT: LDL DIRECT: 45 mg/dL (ref <130)

## 2016-06-10 LAB — GLUCOSE, POCT (MANUAL RESULT ENTRY): POC Glucose: 161 mg/dl — AB (ref 70–99)

## 2016-06-10 MED ORDER — ZOSTER VACCINE LIVE 19400 UNT/0.65ML ~~LOC~~ SUSR: 0.6500 mL | Freq: Once | SUBCUTANEOUS | Status: DC

## 2016-06-10 MED ORDER — TRAMADOL HCL 50 MG PO TABS: 50.0000 mg | ORAL_TABLET | Freq: Every day | ORAL | Status: DC | PRN

## 2016-06-10 MED ORDER — SITAGLIPTIN PHOSPHATE 100 MG PO TABS: 100.0000 mg | ORAL_TABLET | Freq: Every day | ORAL | Status: DC

## 2016-06-10 MED ORDER — GLIMEPIRIDE 2 MG PO TABS: 2.0000 mg | ORAL_TABLET | Freq: Every day | ORAL | Status: DC

## 2016-06-10 MED ORDER — DICLOFENAC SODIUM 75 MG PO TBEC: 75.0000 mg | DELAYED_RELEASE_TABLET | Freq: Two times a day (BID) | ORAL | Status: DC

## 2016-06-10 MED FILL — GLIMEPIRIDE 2 MG TABLET: 2 | 30 days supply | Qty: 60 | Fill #0

## 2016-06-10 NOTE — Progress Notes (Signed)
F/U DM  Diarrhea x6 month due to metformin  Pain scale #8 Knee pain, low back pain and rt shoulder pain  Tobacco user 1/2 ppday  No suicidal thoughts in the past two weeks

## 2016-06-10 NOTE — Patient Instructions (Addendum)
Denyce was seen today for diabetes, back pain, shoulder pain and knee pain.  Diagnoses and all orders for this visit:  Uncontrolled type 2 diabetes mellitus with hyperglycemia, without long-term current use of insulin (HCC) -     HgB A1c -     Glucose (CBG) -     LDL Cholesterol, Direct -     glimepiride (AMARYL) 2 MG tablet; Take 1 tablet (2 mg total) by mouth daily before breakfast. 2 mg with breakfast for two weeks, then 4 mg daily -     sitaGLIPtin (JANUVIA) 100 MG tablet; Take 1 tablet (100 mg total) by mouth daily.  Screening for HIV (human immunodeficiency virus) -     HIV antibody (with reflex)  Dupuytren's contracture of both hands -     Ambulatory referral to Hand Surgery -     traMADol (ULTRAM) 50 MG tablet; Take 1 tablet (50 mg total) by mouth daily as needed.  Arthritis -     traMADol (ULTRAM) 50 MG tablet; Take 1 tablet (50 mg total) by mouth daily as needed. -     diclofenac (VOLTAREN) 75 MG EC tablet; Take 1 tablet (75 mg total) by mouth 2 (two) times daily.  Healthcare maintenance -     Discontinue: Zoster Vaccine Live, PF, (ZOSTAVAX) 13086 UNT/0.65ML injection; Inject 19,400 Units into the skin once. -     Zoster Vaccine Live, PF, (ZOSTAVAX) 57846 UNT/0.65ML injection; Inject 19,400 Units into the skin once.   F/u in 4-6 weeks for arthritis, call when you are in need of tramadol refill   Dr. Adrian Blackwater

## 2016-06-10 NOTE — Progress Notes (Signed)
Subjective:  Patient ID: Bailey [name], female    DOB: [birth year]  Age: 60 y.o. MRN: [id number]  CC: Diabetes   HPI Bailey [name] presents for    1. DM2: she has diarrhea from metformin. She is intolerant of metformin. She tolerates [place] well.  She is resistant to injectable therapy. She denies polyuria and polydipsia. No vision changes.   2. Dupuytren contracture in hands: this is chronic. She is developing deformity. There is no significant pain.   3. Arthritis: chronic with pain in shoulders and knees. She currently takes OTC pain medicine but this does not control her symptoms. She is resistant to narcotics, but willing to take tramadol sparingly. She is applying for disability and has recently been evaluated by ortho.    Social History  Substance Use Topics  . Smoking status: Current Every Day Smoker -- 0.50 packs/day for 28 years  . Smokeless tobacco: Never Used     Comment: thinking about it  . Alcohol Use: No   Outpatient Prescriptions Prior to Visit  Medication Sig Dispense Refill  . aspirin-acetaminophen-caffeine (EXCEDRIN MIGRAINE) 250-250-65 MG per tablet Take 1 tablet by mouth every 6 (six) hours as needed for headache.    Marland Kitchen atorvastatin (LIPITOR) 40 MG tablet Take 1 tablet (40 mg total) by mouth daily. 30 tablet 11  . Blood Glucose Monitoring Suppl (TRUE METRIX METER) W/DEVICE KIT 1 each by Does not apply route as needed. 1 kit 0  . estradiol (VIVELLE-DOT) 0.025 MG/24HR Place 1 patch onto the skin 2 (two) times a week. 8 patch 12  . glucose blood (TRUE METRIX BLOOD GLUCOSE TEST) test strip 1 each by Other route 3 (three) times daily. 100 each 11  . metFORMIN (GLUCOPHAGE XR) 500 MG 24 hr tablet Take 2 tablets (1,000 mg total) by mouth every morning. 60 tablet 5  . Multiple Vitamins-Minerals (MULTIVITAMIN WITH MINERALS) tablet Take 1 tablet by mouth every other day.     . sitaGLIPtin (JANUVIA) 100 MG tablet Take 1 tablet (100 mg total) by mouth daily. 30  tablet 3  . TRUEPLUS LANCETS 28G MISC 1 each by Does not apply route 3 (three) times daily. 100 each 11   No facility-administered medications prior to visit.    ROS Review of Systems  Constitutional: Negative for fever and chills.  Eyes: Negative for visual disturbance.  Respiratory: Negative for shortness of breath.   Cardiovascular: Negative for chest pain.  Gastrointestinal: Positive for diarrhea. Negative for abdominal pain and blood in stool.  Musculoskeletal: Positive for back pain and arthralgias.  Skin: Negative for rash.  Allergic/Immunologic: Negative for immunocompromised state.  Hematological: Negative for adenopathy. Does not bruise/bleed easily.  Psychiatric/Behavioral: Negative for suicidal ideas and dysphoric mood.    Objective:  BP 136/72 mmHg  Pulse 66  Temp(Src) 98.5 F (36.9 C) (Oral)  Resp 16  Ht '5\' 2"'  (1.575 m)  Wt 144 lb (65.318 kg)  BMI 26.33 kg/m2  SpO2 100%  BP/Weight 06/10/2016 03/08/2016 64/40/3474  Systolic BP 259 563 875  Diastolic BP 72 75 79  Wt. (Lbs) 144 143 142  BMI 26.33 26.15 25.97   Physical Exam  Constitutional: She is oriented to person, place, and time. She appears well-developed and well-nourished. No distress.  HENT:  Head: Normocephalic and atraumatic.  Cardiovascular: Normal rate, regular rhythm, normal heart sounds and intact distal pulses.   Pulmonary/Chest: Effort normal and breath sounds normal.  Musculoskeletal: She exhibits no edema.       Hands: Neurological:  She is alert and oriented to person, place, and time.  Skin: Skin is warm and dry. No rash noted.  Psychiatric: She has a normal mood and affect.   Lab Results  Component Value Date   HGBA1C 8.7 03/08/2016   Lab Results  Component Value Date   HGBA1C 9.5 06/10/2016   CBG 161  Assessment & Plan:  Bailey [name] was seen today for diabetes, back pain, shoulder pain and knee pain.  Diagnoses and all orders for this visit:  Uncontrolled type 2 diabetes  mellitus with hyperglycemia, without long-term current use of insulin (HCC) -     HgB A1c -     Glucose (CBG) -     LDL Cholesterol, Direct -     glimepiride (AMARYL) 2 MG tablet; Take 1 tablet (2 mg total) by mouth daily before breakfast. 2 mg with breakfast for two weeks, then 4 mg daily -     sitaGLIPtin (JANUVIA) 100 MG tablet; Take 1 tablet (100 mg total) by mouth daily.  Screening for HIV (human immunodeficiency virus) -     HIV antibody (with reflex)  Dupuytren's contracture of both hands -     Ambulatory referral to Hand Surgery -     traMADol (ULTRAM) 50 MG tablet; Take 1 tablet (50 mg total) by mouth daily as needed. -     diclofenac (VOLTAREN) 75 MG EC tablet; Take 1 tablet (75 mg total) by mouth 2 (two) times daily.  Arthritis -     traMADol (ULTRAM) 50 MG tablet; Take 1 tablet (50 mg total) by mouth daily as needed. -     diclofenac (VOLTAREN) 75 MG EC tablet; Take 1 tablet (75 mg total) by mouth 2 (two) times daily.  Healthcare maintenance -     Discontinue: Zoster Vaccine Live, PF, (ZOSTAVAX) 33007 UNT/0.65ML injection; Inject 19,400 Units into the skin once. -     Zoster Vaccine Live, PF, (ZOSTAVAX) 62263 UNT/0.65ML injection; Inject 19,400 Units into the skin once.    No orders of the defined types were placed in this encounter.    Follow-up: Return in about 6 weeks (around 07/22/2016) for arthritis .   Boykin Nearing MD

## 2016-06-13 LAB — HIV ANTIBODY (ROUTINE TESTING W REFLEX): HIV 1&2 Ab, 4th Generation: NONREACTIVE

## 2016-06-13 NOTE — Assessment & Plan Note (Signed)
Referral to hand surgery placed. 

## 2016-06-13 NOTE — Assessment & Plan Note (Signed)
R shoulder mostly but also in knees  tramadol for pain control

## 2016-06-13 NOTE — Assessment & Plan Note (Signed)
Uncontrolled diabetes Intolerant of metformin  Plan Continue [place] Add amaryl

## 2016-06-15 ENCOUNTER — Other Ambulatory Visit: Payer: Self-pay | Admitting: Pharmacist

## 2016-06-15 MED ORDER — ACCU-CHEK AVIVA PLUS W/DEVICE KIT
PACK | Status: DC
Start: 2016-06-15 — End: 2017-04-19

## 2016-06-15 MED ORDER — ACCU-CHEK SOFTCLIX LANCETS MISC: Status: DC

## 2016-06-15 MED ORDER — ACCU-CHEK SOFTCLIX LANCET DEV MISC: Status: DC

## 2016-06-15 MED ORDER — GLUCOSE BLOOD VI STRP: ORAL_STRIP | Status: DC

## 2016-06-15 MED FILL — ACCU-CHEK AVIVA PLUS METER: W/DEVICE | 1 days supply | Qty: 1 | Fill #0

## 2016-06-15 MED FILL — ACCU-CHEK SOFTCLIX LANCETS: 30 days supply | Qty: 100 | Fill #0

## 2016-06-15 MED FILL — ACCU-CHEK AVIVA PLUS TEST S: 100 days supply | Qty: 100 | Fill #0

## 2016-06-16 ENCOUNTER — Encounter: Payer: Self-pay | Admitting: Family Medicine

## 2016-06-16 DIAGNOSIS — Z0271 Encounter for disability determination: Secondary | ICD-10-CM | POA: Insufficient documentation

## 2016-07-01 ENCOUNTER — Ambulatory Visit (INDEPENDENT_AMBULATORY_CARE_PROVIDER_SITE_OTHER): Payer: Medicaid Other | Admitting: Neurology

## 2016-07-01 ENCOUNTER — Other Ambulatory Visit (INDEPENDENT_AMBULATORY_CARE_PROVIDER_SITE_OTHER): Payer: Medicaid Other

## 2016-07-01 ENCOUNTER — Encounter: Payer: Self-pay | Admitting: Neurology

## 2016-07-01 VITALS — BP 100/70 | HR 85 | Ht 62.0 in | Wt 144.2 lb

## 2016-07-01 DIAGNOSIS — Z720 Tobacco use: Secondary | ICD-10-CM | POA: Diagnosis not present

## 2016-07-01 DIAGNOSIS — R202 Paresthesia of skin: Secondary | ICD-10-CM | POA: Diagnosis not present

## 2016-07-01 DIAGNOSIS — R29898 Other symptoms and signs involving the musculoskeletal system: Secondary | ICD-10-CM

## 2016-07-01 LAB — TSH: TSH: 3.2 u[IU]/mL (ref 0.35–4.50)

## 2016-07-01 LAB — VITAMIN D 25 HYDROXY (VIT D DEFICIENCY, FRACTURES): VITD: 18.93 ng/mL — ABNORMAL LOW (ref 30.00–100.00)

## 2016-07-01 LAB — FOLATE: FOLATE: 11.7 ng/mL (ref >5.9)

## 2016-07-01 NOTE — Patient Instructions (Addendum)
1.  NCS/EMG of the right arm and leg 2.  Check blood work 3.  Further recommendations will be made based on the results of your testing  Return to clinic in 3 months

## 2016-07-01 NOTE — Progress Notes (Signed)
[facility] Clinic Note - Initial Visit   Date: 07/01/2016  Bailey [name] MRN: [id number] DOB: [birth year]   Dear Dr. Adrian Blackwater:  Thank you for your kind referral of Bailey [name] for consultation of right sided weakness. Although her history is well known to you, please allow [place] to reiterate it for the purpose of our medical record. The patient was accompanied to the clinic by self.    History of Present Illness: Bailey [name] is a 60 y.o. right-handed African American female with diabetes mellitus, hyperlipidemia, tobacco abuse, and arthritis presenting for evaluation of right sided weakness.    Per review of Epic, she has reported right sided weakness in the summer of 2016.  She presented to the ED in August for sensation of right foot weakness and drop where MRI brain showed moderate chronic microvascular changes. In late 2016, she reports having a fall while climbing three steps to go to her porch and her right leg buckled on her.  She was able to stand up unassisted.  Since then, she continues to have intermittent right leg buckling and has not sustained any falls.  She has weakness of the right leg and associated pain which involves her thigh and hip.  Weakness is intermittent and triggered by standing.  Pain is alleviated if she flexes her knee.    She has right hand numbness/tingling and weakness.  She is unable to lift heavy objects with her right hand.  She endorses neck and back pain.    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  Out-side paper records, electronic medical record, and images have been reviewed where available and summarized as:  MRI brain 08/22/2015: Negative for acute abnormality. Mild to moderate chronic microvascular ischemic change in the white matter.  CT cervical spine wo contrast 03/14/2009:  Negative for fracture, subluxation or static signs of instability. Labs 06/10/2016:  HbA1c 9.5*, HIV  neg  Lab Results  Component Value Date   TSH 1.545 07/24/2014    Past Medical History  Diagnosis Date  . Allergy   . Arthritis     RIGHT shoulder  . Fracture of fourth toe, right, closed   . Diabetes mellitus without complication [facility])     Past Surgical History  Procedure Laterality Date  . Rotator cuff repair Right     then redo, then surgery due to frozen shoulder  . Cholecystectomy    . Abdominal hysterectomy  1998    with bilateral oophrectomy, PID      Medications:  Outpatient Encounter Prescriptions as of 07/01/2016  Medication Sig  . ACCU-CHEK SOFTCLIX LANCETS lancets Use as instructed  . aspirin-acetaminophen-caffeine (EXCEDRIN MIGRAINE) 250-250-65 MG per tablet Take 1 tablet by mouth every 6 (six) hours as needed for headache.  Marland Kitchen atorvastatin (LIPITOR) 40 MG tablet Take 1 tablet (40 mg total) by mouth daily.  . Blood Glucose Monitoring Suppl (ACCU-CHEK AVIVA PLUS) w/Device KIT Use as directed  . diclofenac (VOLTAREN) 75 MG EC tablet Take 1 tablet (75 mg total) by mouth 2 (two) times daily.  Marland Kitchen estradiol (VIVELLE-DOT) 0.025 MG/24HR Place 1 patch onto the skin 2 (two) times a week.  Marland Kitchen glimepiride (AMARYL) 2 MG tablet Take 1 tablet (2 mg total) by mouth daily before breakfast. 2 mg with breakfast for two weeks, then 4 mg daily  . glucose blood (ACCU-CHEK AVIVA PLUS) test strip Use as instructed  . Lancet Devices (ACCU-CHEK SOFTCLIX) lancets Use as instructed  .  Multiple Vitamins-Minerals (MULTIVITAMIN WITH MINERALS) tablet Take 1 tablet by mouth every other day.   . sitaGLIPtin (JANUVIA) 100 MG tablet Take 1 tablet (100 mg total) by mouth daily.  . traMADol (ULTRAM) 50 MG tablet Take 1 tablet (50 mg total) by mouth daily as needed.  . Zoster Vaccine Live, PF, (ZOSTAVAX) 96759 UNT/0.65ML injection Inject 19,400 Units into the skin once.   No facility-administered encounter medications on file as of 07/01/2016.     Allergies:  Allergies  Allergen Reactions  .  Metformin And Related Diarrhea  . Ivp Dye [Iodinated Diagnostic Agents] Hives  . Codeine Other (See Comments)    Unknown childhood allergy  . Latex Other (See Comments)    blisters  . Penicillins Other (See Comments)    Passed out    Family History: Family History  Problem Relation Age of Onset  . Diabetes Mother   . Hypertension Mother   . Dementia Mother   . Pancreatitis Mother   . Asthma Mother   . COPD Mother   . Stroke Mother   . Hyperlipidemia Mother   . Heart disease Mother   . Hypertension Father   . Hypertension Sister   . Diabetes Sister   . Hyperlipidemia Sister   . Hypertension Brother   . Diabetes Brother   . Drug abuse Brother   . Cancer Brother     colon  . Hyperlipidemia Brother   . Hyperlipidemia Brother   . Hypertension Brother   . Hyperlipidemia Sister   . Hypertension Sister   . Diabetes Sister   . Diabetes Maternal Grandmother     Social History: Social History  Substance Use Topics  . Smoking status: Current Every Day Smoker -- 0.50 packs/day for 28 years  . Smokeless tobacco: Never Used     Comment: thinking about it  . Alcohol Use: No   Social History   Social History Narrative   Currently living alone, separated from husband.  One story home.  [name]'s children are adults-2 live in [place], 2 live in [place].     Works for a [employer].     Education: 2 years of college.    Review of Systems:  CONSTITUTIONAL: No fevers, chills, night sweats, or weight loss.   EYES: No visual changes or eye pain ENT: No hearing changes.  No history of nose bleeds.   RESPIRATORY: No cough, wheezing and shortness of breath.   CARDIOVASCULAR: Negative for chest pain, and palpitations.   GI: Negative for abdominal discomfort, blood in stools or black stools.  No recent change in bowel habits.   GU:  No history of incontinence.   MUSCLOSKELETAL: +history of joint pain or swelling.  No myalgias.   SKIN: Negative for lesions, rash, and  itching.   HEMATOLOGY/ONCOLOGY: Negative for prolonged bleeding, bruising easily, and swollen nodes.  No history of cancer.   ENDOCRINE: Negative for cold or heat intolerance, polydipsia or goiter.   PSYCH:  No depression or anxiety symptoms.   NEURO: As Above.   Vital Signs:  BP 100/70 mmHg  Pulse 85  Ht '5\' 2"'  (1.575 m)  Wt 144 lb 4 oz (65.431 kg)  BMI 26.38 kg/m2  SpO2 98%   General Medical Exam:   General:  Well appearing, comfortable.   Eyes/ENT: see cranial nerve examination.   Neck: No masses appreciated.  Full range of motion without tenderness.  No carotid bruits. Respiratory:  Clear to auscultation, good air entry bilaterally.   Cardiac:  Regular rate  and rhythm, no murmur.   Extremities:  No deformities, edema, or skin discoloration.  Skin:  No rashes or lesions.  Neurological Exam: MENTAL STATUS including orientation to time, place, person, recent and remote memory, attention span and concentration, language, and fund of knowledge is normal.  Speech is not dysarthric.  CRANIAL NERVES: II:  No visual field defects.  Unremarkable fundi.   III-IV-VI: Pupils equal round and reactive to light.  Normal conjugate, extra-ocular eye movements in all directions of gaze.  No nystagmus.  No ptosis V:  Normal facial sensation.  Jaw jerk is absent.   VII:  Normal facial symmetry and movements.  No pathologic facial reflexes.  VIII:  Normal hearing and vestibular function.   IX-X:  Normal palatal movement.   XI:  Normal shoulder shrug and head rotation.   XII:  Normal tongue strength and range of motion, no deviation or fasciculation.  MOTOR:  No atrophy, fasciculations or abnormal movements.  No pronator drift.  Tone is normal.    Right Upper Extremity:    Left Upper Extremity:    Deltoid  5/5   Deltoid  5/5   Biceps  5/5   Biceps  5/5   Triceps  5/5   Triceps  5/5   Wrist extensors  5/5   Wrist extensors  5/5   Wrist flexors  5/5   Wrist flexors  5/5   Finger extensors  5/5    Finger extensors  5/5   Finger flexors  5/5   Finger flexors  5/5   Dorsal interossei  5/5   Dorsal interossei  5/5   Abductor pollicis  5/5   Abductor pollicis  5/5   Tone (Ashworth scale)  0  Tone (Ashworth scale)  0   Right Lower Extremity:    Left Lower Extremity:    Hip flexors  5/5   Hip flexors  5/5   Hip extensors  5/5   Hip extensors  5/5   Knee flexors  5/5   Knee flexors  5/5   Knee extensors  5/5   Knee extensors  5/5   Dorsiflexors  5/5   Dorsiflexors  5/5   Plantarflexors  5/5   Plantarflexors  5/5   Toe extensors  5/5   Toe extensors  5/5   Toe flexors  5/5   Toe flexors  5/5   Tone (Ashworth scale)  0  Tone (Ashworth scale)  0   MSRs:  Right                                                                 Left brachioradialis 2+  brachioradialis 2+  biceps 2+  biceps 2+  triceps 2+  triceps 2+  patellar 2+  patellar 2+  ankle jerk 1+  ankle jerk 1+  Hoffman no  Hoffman no  plantar response down  plantar response down   SENSORY: sensation to pin prick is delayed and reduced over the right arm and leg.  Temperature and vibration is intact throughout. Rhomberg's sign absent.   COORDINATION/GAIT: Normal finger-to- nose-finger.  Intact rapid alternating movements bilaterally.  Able to rise from a chair without using arms.  Gait narrow based and stable. Tandem and stressed gait intact.    IMPRESSION: Episodic right sided  weakness.  MRI brain was personally reviewed which shows chronic white matter changes throughout, including left parietal area which is slightly larger.  There are no upper motor neuron findings to suggest this area could be symptomatic, but if her electrodiagnostic testing returns negative, it would be reasonable to re-image the brain with contrast to exclude demyelinating disease.  In the meantime, EDX testing will look for cervical/lumbosacral radiculopathy as well as entrapment neuropathy.     PLAN/RECOMMENDATIONS:  1.  NCS/EMG of the right arm and  leg 2.  Check vitamin B12, folate, TSH, vitamin D 3.  Encouraged to stop smoking 4.  Consider imaging based on the results of her electrodiagnostic testing 5.  Physical therapy declined until after the testing  Return to clinic in 3 months.   The duration of this appointment visit was 40 minutes of face-to-face time with the patient.  Greater than 50% of this time was spent in counseling, explanation of diagnosis, planning of further management, and coordination of care.   Thank you for allowing me to participate in patient's care.  If I can answer any additional questions, I would be pleased to do so.    Sincerely,    Dimitrios Balestrieri K. Posey Pronto, DO

## 2016-07-05 ENCOUNTER — Ambulatory Visit (INDEPENDENT_AMBULATORY_CARE_PROVIDER_SITE_OTHER): Payer: Medicaid Other | Admitting: Neurology

## 2016-07-05 ENCOUNTER — Telehealth: Payer: Self-pay | Admitting: Neurology

## 2016-07-05 ENCOUNTER — Other Ambulatory Visit (INDEPENDENT_AMBULATORY_CARE_PROVIDER_SITE_OTHER): Payer: Medicaid Other

## 2016-07-05 ENCOUNTER — Other Ambulatory Visit: Payer: Self-pay | Admitting: *Deleted

## 2016-07-05 DIAGNOSIS — R29898 Other symptoms and signs involving the musculoskeletal system: Secondary | ICD-10-CM | POA: Diagnosis not present

## 2016-07-05 DIAGNOSIS — R202 Paresthesia of skin: Secondary | ICD-10-CM | POA: Diagnosis not present

## 2016-07-05 DIAGNOSIS — Z720 Tobacco use: Secondary | ICD-10-CM | POA: Diagnosis not present

## 2016-07-05 DIAGNOSIS — G5601 Carpal tunnel syndrome, right upper limb: Secondary | ICD-10-CM

## 2016-07-05 DIAGNOSIS — M5412 Radiculopathy, cervical region: Secondary | ICD-10-CM

## 2016-07-05 LAB — VITAMIN B12: Vitamin B-12: 396 pg/mL (ref 211–911)

## 2016-07-05 MED ORDER — VITAMIN D (ERGOCALCIFEROL) 1.25 MG (50000 UNIT) PO CAPS: 50000.0000 [IU] | ORAL_CAPSULE | ORAL | Status: DC

## 2016-07-05 NOTE — Procedures (Signed)
[facility]  [place], [place]  [place], [place] [address] Tel: [phone] Fax:  [phone] Test Date:  07/05/2016  Patient: Bailey [name] DOB: August 07, 1956 Physician: Narda Amber, DO  Sex: Female Height: 5\' 2"  Ref Phys: Narda Amber, DO  ID#: [id number] Temp: 32.5C Technician: Jerilynn Mages. Dean   Patient Complaints: This is a 60 year old female referred for evaluation of right arm pain and right leg weakness.  NCV & EMG Findings: Extensive electrodiagnostic testing of the right upper and lower extremity shows: 1. Right median and ulnar sensory responses are within normal limits. Right palmar sensory response is abnormal. 2. Right median and ulnar motor responses are within normal limits. 3. Right sural and superficial peroneal sensory responses are within normal limits. 4. Right tibial and peroneal motor responses are within normal limits. 5. In the upper extremity, chronic motor axon loss changes isolated to the biceps and deltoid muscles, without accompanied active denervation. 6. There is no evidence of active or chronic motor axonal loss changes affecting any muscles lower extremity.  Impression: 1. Right median neuropathy at or distal to the wrist, consistent with a diagnosis of carpal tunnel syndrome. Overall, these findings are mild in degree electrically. 2. Chronic C5 radiculopathy affecting the right upper extremity; very mild in degree electrically. 3. There is no evidence of a generalized sensorimotor polyneuropathy or lumbosacral radiculopathy affecting the right side.   ___________________________ Narda Amber, DO    Nerve Conduction Studies Anti Sensory Summary Table   Site NR Peak (ms) Norm Peak (ms) P-T Amp (V) Norm P-T Amp  Right Median Anti Sensory (2nd Digit)  32.5C  Wrist    3.3 <3.8 33.9 >10  Right Sup Peroneal Anti Sensory (Ant Lat Mall)  32.5C  12 cm    3.0 <4.6 10.8 >3  Right Sural Anti Sensory (Lat Mall)  32.5C  Calf    3.0 <4.6  11.4 >3  Right Ulnar Anti Sensory (5th Digit)  32.5C  Wrist    3.0 <3.2 9.2 >5   Motor Summary Table   Site NR Onset (ms) Norm Onset (ms) O-P Amp (mV) Norm O-P Amp Site1 Site2 Delta-0 (ms) Dist (cm) Vel (m/s) Norm Vel (m/s)  Right Median Motor (Abd Poll Brev)  32.5C  Wrist    3.3 <4.0 5.3 >5 Elbow Wrist 4.0 23.0 58 >50  Elbow    7.3  5.3         Right Peroneal Motor (Ext Dig Brev)  32.5C  Ankle    3.4 <6.0 5.6 >2.5 B Fib Ankle 6.3 31.0 49 >40  B Fib    9.7  5.3  Poplt B Fib 1.6 10.0 62 >40  Poplt    11.3  4.8         Left Tibial Motor (Abd Hall Brev)  32.5C  Ankle    3.0 <6.0 7.5 >4 Knee Ankle 8.4 37.0 44 >40  Knee    11.4  5.9         Right Ulnar Motor (Abd Dig Minimi)  32.5C  Wrist    2.5 <3.1 7.3 >7 B Elbow Wrist 4.4 23.0 52 >50  B Elbow    6.9  6.0  A Elbow B Elbow 1.6 10.0 63 >50  A Elbow    8.5  6.0          Comparison Summary Table   Site NR Peak (ms) Norm Peak (ms) P-T Amp (V) Site1 Site2 Delta-P (ms) Norm Delta (ms)  Right Median/Ulnar Palm Comparison (Wrist -  8cm)  32.5C  Median Palm    2.1 <2.2 51.2 Median Palm Ulnar Palm 0.7   Ulnar Palm    1.4 <2.2 15.9       H Reflex Studies   NR H-Lat (ms) Lat Norm (ms) L-R H-Lat (ms)  Left Tibial (Gastroc)  32.5C     30.88 <35    EMG   Side Muscle Ins Act Fibs Psw Fasc Number Recrt Dur Dur. Amp Amp. Poly Poly. Comment  Right 1stDorInt Nml Nml Nml Nml Nml Nml Nml Nml Nml Nml Nml Nml N/A  Right AntTibialis Nml Nml Nml Nml Nml Nml Nml Nml Nml Nml Nml Nml N/A  Right Gastroc Nml Nml Nml Nml Nml Nml Nml Nml Nml Nml Nml Nml N/A  Right Flex Dig Long Nml Nml Nml Nml Nml Nml Nml Nml Nml Nml Nml Nml N/A  Right GluteusMed Nml Nml Nml Nml Nml Nml Nml Nml Nml Nml Nml Nml N/A  Right RectFemoris Nml Nml Nml Nml Nml Nml Nml Nml Nml Nml Nml Nml N/A  Right Abd Poll Brev Nml Nml Nml Nml Nml Nml Nml Nml Nml Nml Nml Nml N/A  Right PronatorTeres Nml Nml Nml Nml Nml Nml Nml Nml Nml Nml Nml Nml N/A  Right Biceps Nml Nml Nml Nml 1- Mod-R  Few 1+ Few 1+ Nml Nml N/A  Right Triceps Nml Nml Nml Nml Nml Nml Nml Nml Nml Nml Nml Nml N/A  Right Deltoid Nml Nml Nml Nml 1- Mod-R Few 1+ Few 1+ Nml Nml N/A  Right Ext Indicis Nml Nml Nml Nml Nml Nml Nml Nml Nml Nml Nml Nml N/A      Waveforms:

## 2016-07-05 NOTE — Telephone Encounter (Signed)
Results of EMG discussed with patient which shows right carpal tunnel syndrome and C5 radiculopathy, mild in degree electrically. There is no evidence of lumbosacral radiculopathy to explain her right leg weakness. I would like for her to start physical therapy for leg strengthening, if there is no improvement, MRI lumbar spine will be the next step. In the meantime, she was instructed to start using a wrist splint for her right CTS.  Her labs also showed vitamin D deficiency and EMG will be started on vitamin D 50,000units weekly 8 weeks.  Daven Pinckney K. Posey Pronto, DO

## 2016-07-07 ENCOUNTER — Ambulatory Visit: Payer: Medicaid Other | Attending: Family Medicine | Admitting: Physician Assistant

## 2016-07-07 VITALS — BP 125/72 | HR 82 | Temp 98.0°F | Resp 16 | Wt 148.6 lb

## 2016-07-07 DIAGNOSIS — T148 Other injury of unspecified body region: Secondary | ICD-10-CM | POA: Diagnosis not present

## 2016-07-07 DIAGNOSIS — E1165 Type 2 diabetes mellitus with hyperglycemia: Secondary | ICD-10-CM

## 2016-07-07 DIAGNOSIS — W57XXXA Bitten or stung by nonvenomous insect and other nonvenomous arthropods, initial encounter: Secondary | ICD-10-CM | POA: Diagnosis not present

## 2016-07-07 LAB — GLUCOSE, POCT (MANUAL RESULT ENTRY): POC GLUCOSE: 244 mg/dL — AB (ref 70–99)

## 2016-07-07 LAB — POCT GLYCOSYLATED HEMOGLOBIN (HGB A1C): HEMOGLOBIN A1C: 8.9

## 2016-07-07 MED FILL — GLIMEPIRIDE 2 MG TABLET: 2 | 30 days supply | Qty: 60 | Fill #1

## 2016-07-07 MED FILL — JANUVIA 100 MG TABLET: 100 | 30 days supply | Qty: 30 | Fill #3

## 2016-07-07 MED FILL — ATORVASTATIN 40 MG TABLET: 40 | 30 days supply | Qty: 30 | Fill #6

## 2016-07-07 MED FILL — VIVELLE-DOT 0.025 MG PATCH: 0.025 | 30 days supply | Qty: 8 | Fill #6

## 2016-07-07 NOTE — Patient Instructions (Signed)
Tick Bite Information Ticks are insects that attach themselves to the skin and draw blood for food. There are various types of ticks. Common types include wood ticks and deer ticks. Most ticks live in shrubs and grassy areas. Ticks can climb onto your body when you make contact with leaves or grass where the tick is waiting. The most common places on the body for ticks to attach themselves are the scalp, neck, armpits, waist, and groin. Most tick bites are harmless, but sometimes ticks carry germs that cause diseases. These germs can be spread to a person during the tick's feeding process. The chance of a disease spreading through a tick bite depends on:   The type of tick.  Time of year.   How long the tick is attached.   Geographic location.  HOW CAN YOU PREVENT TICK BITES? Take these steps to help prevent tick bites when you are outdoors:  Wear protective clothing. Long sleeves and long pants are best.   Wear white clothes so you can see ticks more easily.  Tuck your pant legs into your socks.   If walking on a trail, stay in the middle of the trail to avoid brushing against bushes.  Avoid walking through areas with long grass.  Put insect repellent on all exposed skin and along boot tops, pant legs, and sleeve cuffs.   Check clothing, hair, and skin repeatedly and before going inside.   Brush off any ticks that are not attached.  Take a shower or bath as soon as possible after being outdoors.  WHAT IS THE PROPER WAY TO REMOVE A TICK? Ticks should be removed as soon as possible to help prevent diseases caused by tick bites. 1. If latex gloves are available, put them on before trying to remove a tick.  2. Using fine-point tweezers, grasp the tick as close to the skin as possible. You may also use curved forceps or a tick removal tool. Grasp the tick as close to its head as possible. Avoid grasping the tick on its body. 3. Pull gently with steady upward pressure until  the tick lets go. Do not twist the tick or jerk it suddenly. This may break off the tick's head or mouth parts. 4. Do not squeeze or crush the tick's body. This could force disease-carrying fluids from the tick into your body.  5. After the tick is removed, wash the bite area and your hands with soap and water or other disinfectant such as alcohol. 6. Apply a small amount of antiseptic cream or ointment to the bite site.  7. Wash and disinfect any instruments that were used.  Do not try to remove a tick by applying a hot match, petroleum jelly, or fingernail polish to the tick. These methods do not work and may increase the chances of disease being spread from the tick bite.  WHEN SHOULD YOU SEEK MEDICAL CARE? Contact your health care provider if you are unable to remove a tick from your skin or if a part of the tick breaks off and is stuck in the skin.  After a tick bite, you need to be aware of signs and symptoms that could be related to diseases spread by ticks. Contact your health care provider if you develop any of the following in the days or weeks after the tick bite:  Unexplained fever.  Rash. A circular rash that appears days or weeks after the tick bite may indicate the possibility of Lyme disease. The rash may resemble   a target with a bull's-eye and may occur at a different part of your body than the tick bite.  Redness and swelling in the area of the tick bite.   Tender, swollen lymph glands.   Diarrhea.   Weight loss.   Cough.   Fatigue.   Muscle, joint, or bone pain.   Abdominal pain.   Headache.   Lethargy or a change in your level of consciousness.  Difficulty walking or moving your legs.   Numbness in the legs.   Paralysis.  Shortness of breath.   Confusion.   Repeated vomiting.    This information is not intended to replace advice given to you by your health care provider. Make sure you discuss any questions you have with your health  care provider.   Document Released: 12/09/2000 Document Revised: 01/02/2015 Document Reviewed: 05/22/2013 Elsevier Interactive Patient Education 2016 Elsevier Inc.  

## 2016-07-07 NOTE — Progress Notes (Signed)
Patient ID: Bailey [name], female   DOB: Mar 19, 1956, 60 y.o.   MRN: [id number]   [name], is a 60 y.o. female  [id number]  [id number]  DOB - 12-13-1956  Chief Complaint  Patient presents with  . Tick Removal        Subjective:  Chief Complaint and HPI: Bailey [name] is a 60 y.o. female here today for a possible tick bite/exposure.  She was doing some work in her yard and after she came inside, she felt something on the R side of her head behind her ear.  She started combing her hair and a very small tick was found.  She has it with her in a tissue(dead).   This happened yesterday.  She denies any itching, rash, fever, HA, or other s/sx.  She is compliant with her diabetes medications.  She says her fasting CBGs have been 130s to 150s.  No s/sx of hyper or hypoglycemia    ROS:   Constitutional:  No f/c, No night sweats, No unexplained weight loss. EENT:  No vision changes, No blurry vision, No hearing changes. No mouth, throat, or ear problems.  Respiratory: No cough, No SOB Cardiac: No CP, no palpitations GI:  No abd pain, No N/V/D. GU: No Urinary s/sx Musculoskeletal: No joint pain Neuro: No headache, no dizziness, no motor weakness.  Skin: No rash Endocrine:  No polydipsia. No polyuria.  Psych: Denies SI/HI  No problems updated.  ALLERGIES: Allergies  Allergen Reactions  . Metformin And Related Diarrhea  . Ivp Dye [Iodinated Diagnostic Agents] Hives  . Codeine Other (See Comments)    Unknown childhood allergy  . Latex Other (See Comments)    blisters  . Penicillins Other (See Comments)    Passed out    PAST MEDICAL HISTORY: Past Medical History  Diagnosis Date  . Allergy   . Arthritis     RIGHT shoulder  . Fracture of fourth toe, right, closed   . Diabetes mellitus without complication ([place])     MEDICATIONS AT HOME: Prior to Admission medications   Medication Sig Start Date End Date Taking? Authorizing Provider  ACCU-CHEK SOFTCLIX  LANCETS lancets Use as instructed 06/15/16  Yes Josalyn Funches, MD  atorvastatin (LIPITOR) 40 MG tablet Take 1 tablet (40 mg total) by mouth daily. 11/16/15  Yes Josalyn Funches, MD  Blood Glucose Monitoring Suppl (ACCU-CHEK AVIVA PLUS) w/Device KIT Use as directed 06/15/16  Yes Josalyn Funches, MD  diclofenac (VOLTAREN) 75 MG EC tablet Take 1 tablet (75 mg total) by mouth 2 (two) times daily. 06/10/16  Yes Josalyn Funches, MD  estradiol (VIVELLE-DOT) 0.025 MG/24HR Place 1 patch onto the skin 2 (two) times a week. 12/10/15  Yes Woodroe Mode, MD  glimepiride (AMARYL) 2 MG tablet Take 1 tablet (2 mg total) by mouth daily before breakfast. 2 mg with breakfast for two weeks, then 4 mg daily 06/10/16  Yes Josalyn Funches, MD  glucose blood (ACCU-CHEK AVIVA PLUS) test strip Use as instructed 06/15/16  Yes Boykin Nearing, MD  Lancet Devices [facility]) lancets Use as instructed 06/15/16  Yes Boykin Nearing, MD  Multiple Vitamins-Minerals (MULTIVITAMIN WITH MINERALS) tablet Take 1 tablet by mouth every other day.    Yes Historical Provider, MD  sitaGLIPtin (JANUVIA) 100 MG tablet Take 1 tablet (100 mg total) by mouth daily. 06/10/16  Yes Josalyn Funches, MD  traMADol (ULTRAM) 50 MG tablet Take 1 tablet (50 mg total) by mouth daily as needed. 06/10/16  Yes Boykin Nearing, MD  Vitamin D, Ergocalciferol, (DRISDOL) 50000 units CAPS capsule Take 1 capsule (50,000 Units total) by mouth every 7 (seven) days. 07/05/16  Yes Donika Keith Rake, DO  Zoster Vaccine Live, PF, (ZOSTAVAX) 52174 UNT/0.65ML injection Inject 19,400 Units into the skin once. 06/10/16  Yes Boykin Nearing, MD  aspirin-acetaminophen-caffeine (EXCEDRIN MIGRAINE) [phone] MG per tablet Take 1 tablet by mouth every 6 (six) hours as needed for headache. Reported on 07/07/2016    Historical Provider, MD     Objective:  EXAM:   Filed Vitals:   07/07/16 0923  BP: 125/72  Pulse: 82  Temp: 98 F (36.7 C)  TempSrc: Oral  Resp: 16  Weight:  148 lb 9.6 oz (67.405 kg)  SpO2: 100%    General appearance : A&OX3. NAD. Non-toxic-appearing HEENT: Atraumatic and Normocephalic.  PERRLA. EOM intact.  TM clear B. Mouth-MMM, post pharynx WNL w/o erythema, No PND. Neck: supple, no JVD. No cervical lymphadenopathy. No thyromegaly Chest/Lungs:  Breathing-non-labored, Good air entry bilaterally, breath sounds normal without rales, rhonchi, or wheezing  CVS: S1 S2 regular, no murmurs, gallops, rubs  Scalp examined-no evidence of tick bite Neurology:  CN II-XII grossly intact, Non focal.   Psych:  TP linear. J/I WNL. Normal speech. Appropriate eye contact and affect.  Skin:  No Rash  Data Review Lab Results  Component Value Date   HGBA1C 8.9 07/07/2016   HGBA1C 9.5 06/10/2016   HGBA1C 8.7 03/08/2016     Assessment & Plan   1. Tick bite No actual evidence of tick bite.  It sounds as though she brushed it off of her head before any actual bite because she felt as though something was moving on the side of her head. The tick was not engorged.  Watch for signs/symptoms of tick bite.  Info provided.   2. Uncontrolled type 2 diabetes mellitus with hyperglycemia, without long-term current use of insulin ([name]) Improving-continue with new regimen that was initiated last month.  - Glucose (CBG) - HgB A1c  Patient have been counseled extensively about nutrition and exercise  Return in about 4 weeks (around 08/04/2016) for 2-4 weeks with Dr Atilano Median. (sooner if s/sx tick fever)  The patient was given clear instructions to go to ER or return to medical center if symptoms don't improve, worsen or new problems develop. The patient verbalized understanding. The patient was told to call to get lab results if they haven't heard anything in the next week.     Freeman Caldron, PA-C [facility] [place], [place]   07/07/2016, 1:16 PM

## 2016-07-08 ENCOUNTER — Telehealth: Payer: Self-pay

## 2016-07-08 NOTE — Telephone Encounter (Signed)
-----   Message from Boykin Nearing, MD sent at 06/13/2016  7:47 AM EDT ----- Screening HIV negative Direct LDL normal

## 2016-08-08 ENCOUNTER — Encounter: Payer: Self-pay | Admitting: Family Medicine

## 2016-08-08 ENCOUNTER — Other Ambulatory Visit: Payer: Self-pay | Admitting: Family Medicine

## 2016-08-08 ENCOUNTER — Ambulatory Visit: Payer: Medicaid Other | Attending: Family Medicine | Admitting: Family Medicine

## 2016-08-08 VITALS — BP 110/70 | HR 79 | Temp 98.1°F | Wt 148.0 lb

## 2016-08-08 DIAGNOSIS — E559 Vitamin D deficiency, unspecified: Secondary | ICD-10-CM | POA: Diagnosis not present

## 2016-08-08 DIAGNOSIS — E1165 Type 2 diabetes mellitus with hyperglycemia: Secondary | ICD-10-CM

## 2016-08-08 DIAGNOSIS — Z7982 Long term (current) use of aspirin: Secondary | ICD-10-CM | POA: Insufficient documentation

## 2016-08-08 DIAGNOSIS — H9313 Tinnitus, bilateral: Secondary | ICD-10-CM | POA: Insufficient documentation

## 2016-08-08 DIAGNOSIS — E119 Type 2 diabetes mellitus without complications: Secondary | ICD-10-CM | POA: Diagnosis present

## 2016-08-08 DIAGNOSIS — F17210 Nicotine dependence, cigarettes, uncomplicated: Secondary | ICD-10-CM | POA: Insufficient documentation

## 2016-08-08 DIAGNOSIS — Z7984 Long term (current) use of oral hypoglycemic drugs: Secondary | ICD-10-CM | POA: Insufficient documentation

## 2016-08-08 DIAGNOSIS — H578 Other specified disorders of eye and adnexa: Secondary | ICD-10-CM

## 2016-08-08 DIAGNOSIS — H9312 Tinnitus, left ear: Secondary | ICD-10-CM | POA: Diagnosis not present

## 2016-08-08 DIAGNOSIS — H5789 Other specified disorders of eye and adnexa: Secondary | ICD-10-CM

## 2016-08-08 LAB — GLUCOSE, POCT (MANUAL RESULT ENTRY): POC GLUCOSE: 184 mg/dL — AB (ref 70–99)

## 2016-08-08 MED ORDER — POLYETHYL GLYCOL-PROPYL GLYCOL 0.4-0.3 % OP SOLN: 2.0000 [drp] | OPHTHALMIC | 5 refills | Status: DC | PRN

## 2016-08-08 MED ORDER — VITAMIN D (ERGOCALCIFEROL) 1.25 MG (50000 UNIT) PO CAPS: 50000.0000 [IU] | ORAL_CAPSULE | ORAL | 0 refills | Status: DC

## 2016-08-08 MED ORDER — GLIMEPIRIDE 4 MG PO TABS: 4.0000 mg | ORAL_TABLET | Freq: Every day | ORAL | 11 refills | Status: DC

## 2016-08-08 MED ORDER — SITAGLIPTIN PHOSPHATE 100 MG PO TABS: 100.0000 mg | ORAL_TABLET | Freq: Every day | ORAL | 11 refills | Status: DC

## 2016-08-08 MED ORDER — CARBAMIDE PEROXIDE 6.5 % OT SOLN: 5.0000 [drp] | Freq: Two times a day (BID) | OTIC | 0 refills | Status: DC

## 2016-08-08 MED FILL — GLIMEPIRIDE 4 MG TABLET: 4 | 30 days supply | Qty: 30 | Fill #0

## 2016-08-08 MED FILL — ATORVASTATIN 40 MG TABLET: 40 | 30 days supply | Qty: 30 | Fill #7

## 2016-08-08 MED FILL — VIVELLE-DOT 0.025 MG PATCH: 0.025 | 30 days supply | Qty: 8 | Fill #7

## 2016-08-08 MED FILL — JANUVIA 100 MG TABLET: 100 | 30 days supply | Qty: 30 | Fill #0

## 2016-08-08 NOTE — Progress Notes (Signed)
Subjective:  Patient ID: Bailey [name], female    DOB: 14-Feb-1956  Age: 60 y.o. MRN: [id number]  CC: Diabetes   HPI CHANIECE BARBATO has diabetes she presents for    1. CHRONIC DIABETES  Disease Monitoring  Blood Sugar Ranges:   Fasting: 112-115  Polyuria: no   Visual problems: no   Medication Compliance: yes  Medication Side Effects  Hypoglycemia: no   2. Ringing in ears: L >R. No dizziness, ear pain, ear drainage, fever, focal tingling or numbness. Constant ringing that is worsening over last several months.  3. Gritty feeling in eyes: request referral to eye doctor. No eye trauma. No eye redness. No drainage from eyes. Visine has not helped.   Social History  Substance Use Topics  . Smoking status: Current Every Day Smoker    Packs/day: 0.50    Years: 28.00  . Smokeless tobacco: Never Used     Comment: thinking about it  . Alcohol use No    Outpatient Medications Prior to Visit  Medication Sig Dispense Refill  . ACCU-CHEK SOFTCLIX LANCETS lancets Use as instructed 100 each 12  . atorvastatin (LIPITOR) 40 MG tablet Take 1 tablet (40 mg total) by mouth daily. 30 tablet 11  . Blood Glucose Monitoring Suppl (ACCU-CHEK AVIVA PLUS) w/Device KIT Use as directed 1 kit 0  . diclofenac (VOLTAREN) 75 MG EC tablet Take 1 tablet (75 mg total) by mouth 2 (two) times daily. 30 tablet 0  . glimepiride (AMARYL) 2 MG tablet Take 1 tablet (2 mg total) by mouth daily before breakfast. 2 mg with breakfast for two weeks, then 4 mg daily 60 tablet 2  . glucose blood (ACCU-CHEK AVIVA PLUS) test strip Use as instructed 100 each 12  . Lancet Devices (ACCU-CHEK SOFTCLIX) lancets Use as instructed 1 each 0  . Multiple Vitamins-Minerals (MULTIVITAMIN WITH MINERALS) tablet Take 1 tablet by mouth every other day.     . sitaGLIPtin (JANUVIA) 100 MG tablet Take 1 tablet (100 mg total) by mouth daily. 30 tablet 5  . traMADol (ULTRAM) 50 MG tablet Take 1 tablet (50 mg total) by mouth daily as  needed. 30 tablet 0  . Vitamin D, Ergocalciferol, (DRISDOL) 50000 units CAPS capsule Take 1 capsule (50,000 Units total) by mouth every 7 (seven) days. 8 capsule 0  . aspirin-acetaminophen-caffeine (EXCEDRIN MIGRAINE) [phone] MG per tablet Take 1 tablet by mouth every 6 (six) hours as needed for headache. Reported on 07/07/2016    . estradiol (VIVELLE-DOT) 0.025 MG/24HR Place 1 patch onto the skin 2 (two) times a week. 8 patch 12  . Zoster Vaccine Live, PF, (ZOSTAVAX) 63893 UNT/0.65ML injection Inject 19,400 Units into the skin once. 1 each 0   No facility-administered medications prior to visit.     ROS Review of Systems  Constitutional: Negative for chills and fever.  HENT: Positive for tinnitus.   Eyes: Negative for photophobia, discharge, redness, itching and visual disturbance.  Respiratory: Negative for shortness of breath.   Cardiovascular: Negative for chest pain.  Gastrointestinal: Negative for abdominal pain, blood in stool and diarrhea.  Musculoskeletal: Negative for arthralgias and back pain.  Skin: Negative for rash.  Allergic/Immunologic: Negative for immunocompromised state.  Hematological: Negative for adenopathy. Does not bruise/bleed easily.  Psychiatric/Behavioral: Negative for dysphoric mood and suicidal ideas.    Objective:  BP 110/70 (BP Location: Left Arm, Patient Position: Sitting, Cuff Size: Large)   Pulse 79   Temp 98.1 F (36.7 C) (Oral)   Wt 148  lb (67.1 kg)   SpO2 97%   BMI 27.07 kg/m   BP/Weight 08/08/2016 6/38/9373 03/28/8767  Systolic BP 115 726 203  Diastolic BP 70 72 70  Wt. (Lbs) 148 148.6 144.25  BMI 27.07 27.17 26.38   Physical Exam  Constitutional: She is oriented to person, place, and time. She appears well-developed and well-nourished. No distress.  HENT:  Head: Normocephalic and atraumatic.  Right Ear: Tympanic membrane, external ear and ear canal normal.  Cerumen impacting L ear   Eyes: Conjunctivae, EOM and lids are normal.  Pupils are equal, round, and reactive to light.  Pulmonary/Chest: Effort normal.  Musculoskeletal: She exhibits no edema.       Hands: Neurological: She is alert and oriented to person, place, and time.  Skin: Skin is warm and dry. No rash noted.  Psychiatric: She has a normal mood and affect.   Lab Results  Component Value Date   HGBA1C 8.9 07/07/2016   CBG 184   Assessment & Plan:   Laiylah was seen today for diabetes.  Diagnoses and all orders for this visit:  Uncontrolled type 2 diabetes mellitus with hyperglycemia, without long-term current use of insulin (HCC) -     Glucose (CBG) -     glimepiride (AMARYL) 4 MG tablet; Take 1 tablet (4 mg total) by mouth daily before breakfast. -     sitaGLIPtin (JANUVIA) 100 MG tablet; Take 1 tablet (100 mg total) by mouth daily. -     Ambulatory referral to Ophthalmology  Irritation of eye -     Polyethyl Glycol-Propyl Glycol (SYSTANE) 0.4-0.3 % SOLN; Apply 2 drops to eye as needed.  Vitamin D deficiency -     Vitamin D, Ergocalciferol, (DRISDOL) 50000 units CAPS capsule; Take 1 capsule (50,000 Units total) by mouth every 7 (seven) days.  Ringing in left ear -     carbamide peroxide (DEBROX) 6.5 % otic solution; Place 5 drops into the left ear 2 (two) times daily. For 5 days then return for ear irrigation    Meds ordered this encounter  Medications  . glimepiride (AMARYL) 4 MG tablet    Sig: Take 1 tablet (4 mg total) by mouth daily before breakfast.    Dispense:  30 tablet    Refill:  11  . sitaGLIPtin (JANUVIA) 100 MG tablet    Sig: Take 1 tablet (100 mg total) by mouth daily.    Dispense:  30 tablet    Refill:  11  . Polyethyl Glycol-Propyl Glycol (SYSTANE) 0.4-0.3 % SOLN    Sig: Apply 2 drops to eye as needed.    Dispense:  5 mL    Refill:  5  . Vitamin D, Ergocalciferol, (DRISDOL) 50000 units CAPS capsule    Sig: Take 1 capsule (50,000 Units total) by mouth every 7 (seven) days.    Dispense:  4 capsule    Refill:  0    . carbamide peroxide (DEBROX) 6.5 % otic solution    Sig: Place 5 drops into the left ear 2 (two) times daily. For 5 days then return for ear irrigation    Dispense:  15 mL    Refill:  0    Follow-up: Return in about 10 weeks (around 10/17/2016) for diabetes and flu shot .   Boykin Nearing MD

## 2016-08-08 NOTE — Patient Instructions (Addendum)
Bailey [name] was seen today for diabetes.  Diagnoses and all orders for this visit:  Uncontrolled type 2 diabetes mellitus with hyperglycemia, without long-term current use of insulin (HCC) -     Glucose (CBG) -     glimepiride (AMARYL) 4 MG tablet; Take 1 tablet (4 mg total) by mouth daily before breakfast. -     sitaGLIPtin (JANUVIA) 100 MG tablet; Take 1 tablet (100 mg total) by mouth daily. -     Ambulatory referral to Ophthalmology  Irritation of eye -     Polyethyl Glycol-Propyl Glycol (SYSTANE) 0.4-0.3 % SOLN; Apply 2 drops to eye as needed.  Vitamin D deficiency -     Vitamin D, Ergocalciferol, (DRISDOL) 50000 units CAPS capsule; Take 1 capsule (50,000 Units total) by mouth every 7 (seven) days.  Ask your mother and sister if they have degenerative disease which is arthritis or degenerative bone disease which is osteoporosis. You will be done for bone density scan for osteoporosis screen after age 63.  F/u in mid October for diabetes, A1c screen and flu shot   Dr. Adrian Blackwater

## 2016-08-08 NOTE — Assessment & Plan Note (Signed)
Improved with fasting CBGs at goal with the addition of amaryl to [place] She was intolerant of metformin due to diarrhea.

## 2016-08-09 ENCOUNTER — Encounter: Payer: Self-pay | Admitting: Family Medicine

## 2016-08-18 ENCOUNTER — Encounter (HOSPITAL_BASED_OUTPATIENT_CLINIC_OR_DEPARTMENT_OTHER): Payer: Self-pay | Admitting: *Deleted

## 2016-08-18 NOTE — Progress Notes (Signed)
NPO AFTER MN.  ARRIVE AT 0830.  NEEDS ISTATE AND EKG.  MAY TAKE TRAMADOL IF NEEDED AM DOS W/ SIPS OF WATER.

## 2016-08-24 ENCOUNTER — Ambulatory Visit (HOSPITAL_BASED_OUTPATIENT_CLINIC_OR_DEPARTMENT_OTHER)
Admission: RE | Admit: 2016-08-24 | Discharge: 2016-08-24 | Disposition: A | Discharge status: Home / Self Care | Payer: Medicaid Other | Source: Ambulatory Visit | Attending: Orthopedic Surgery | Admitting: Orthopedic Surgery

## 2016-08-24 ENCOUNTER — Encounter (HOSPITAL_BASED_OUTPATIENT_CLINIC_OR_DEPARTMENT_OTHER)
Admission: RE | Disposition: A | Discharge status: Home / Self Care | Payer: Self-pay | Source: Ambulatory Visit | Attending: Orthopedic Surgery

## 2016-08-24 ENCOUNTER — Ambulatory Visit (HOSPITAL_BASED_OUTPATIENT_CLINIC_OR_DEPARTMENT_OTHER): Payer: Medicaid Other | Admitting: Certified Registered"

## 2016-08-24 ENCOUNTER — Encounter (HOSPITAL_BASED_OUTPATIENT_CLINIC_OR_DEPARTMENT_OTHER): Payer: Self-pay | Admitting: *Deleted

## 2016-08-24 DIAGNOSIS — E119 Type 2 diabetes mellitus without complications: Secondary | ICD-10-CM | POA: Diagnosis not present

## 2016-08-24 DIAGNOSIS — F17210 Nicotine dependence, cigarettes, uncomplicated: Secondary | ICD-10-CM | POA: Diagnosis not present

## 2016-08-24 DIAGNOSIS — E559 Vitamin D deficiency, unspecified: Secondary | ICD-10-CM | POA: Diagnosis not present

## 2016-08-24 DIAGNOSIS — Z79899 Other long term (current) drug therapy: Secondary | ICD-10-CM | POA: Insufficient documentation

## 2016-08-24 DIAGNOSIS — M720 Palmar fascial fibromatosis [Dupuytren]: Secondary | ICD-10-CM | POA: Insufficient documentation

## 2016-08-24 DIAGNOSIS — E785 Hyperlipidemia, unspecified: Secondary | ICD-10-CM | POA: Diagnosis not present

## 2016-08-24 DIAGNOSIS — E781 Pure hyperglyceridemia: Secondary | ICD-10-CM | POA: Insufficient documentation

## 2016-08-24 HISTORY — DX: Vitamin D deficiency, unspecified: E55.9

## 2016-08-24 HISTORY — DX: Type 2 diabetes mellitus with hyperglycemia: E11.65

## 2016-08-24 HISTORY — DX: Carpal tunnel syndrome, right upper limb: G56.01

## 2016-08-24 HISTORY — DX: Palmar fascial fibromatosis (dupuytren): M72.0

## 2016-08-24 HISTORY — PX: FASCIECTOMY: SHX6525

## 2016-08-24 HISTORY — DX: Radiculopathy, cervical region: M54.12

## 2016-08-24 HISTORY — DX: Personal history of other diseases of the female genital tract: Z87.42

## 2016-08-24 HISTORY — DX: Personal history of other diseases of the digestive system: Z87.19

## 2016-08-24 HISTORY — DX: Hyperlipidemia, unspecified: E78.5

## 2016-08-24 HISTORY — DX: Personal history of adenomatous and serrated colon polyps: Z86.0101

## 2016-08-24 HISTORY — DX: Reserved for concepts with insufficient information to code with codable children: IMO0002

## 2016-08-24 HISTORY — DX: Personal history of colonic polyps: Z86.010

## 2016-08-24 HISTORY — DX: Personal history of peptic ulcer disease: Z87.11

## 2016-08-24 HISTORY — DX: Pure hyperglyceridemia: E78.1

## 2016-08-24 LAB — POCT I-STAT 4, (NA,K, GLUC, HGB,HCT)
GLUCOSE: 171 mg/dL — AB (ref 65–99)
HCT: 40 % (ref 36.0–46.0)
Hemoglobin: 13.6 g/dL (ref 12.0–15.0)
Potassium: 3.7 mmol/L (ref 3.5–5.1)
SODIUM: 146 mmol/L — AB (ref 135–145)

## 2016-08-24 LAB — GLUCOSE, CAPILLARY: GLUCOSE-CAPILLARY: 187 mg/dL — AB (ref 65–99)

## 2016-08-24 SURGERY — FASCIECTOMY, PALM
Anesthesia: General | Laterality: Left

## 2016-08-24 MED ORDER — NEOSTIGMINE METHYLSULFATE 5 MG/5ML IV SOSY
PREFILLED_SYRINGE | INTRAVENOUS | Status: AC
  Filled 2016-08-24: qty 5

## 2016-08-24 MED ORDER — LIDOCAINE 2% (20 MG/ML) 5 ML SYRINGE
INTRAMUSCULAR | Status: DC | PRN
  Administered 2016-08-24: 60 mg via INTRAVENOUS

## 2016-08-24 MED ORDER — MIDAZOLAM HCL 5 MG/5ML IJ SOLN
INTRAMUSCULAR | Status: DC | PRN
  Administered 2016-08-24: 2 mg via INTRAVENOUS

## 2016-08-24 MED ORDER — PROPOFOL 10 MG/ML IV BOLUS
INTRAVENOUS | Status: AC
  Filled 2016-08-24: qty 20

## 2016-08-24 MED ORDER — HYDROCODONE-ACETAMINOPHEN 5-300 MG PO TABS: 1.0000 | ORAL_TABLET | Freq: Two times a day (BID) | ORAL | 0 refills | Status: DC | PRN

## 2016-08-24 MED ORDER — ONDANSETRON HCL 4 MG/2ML IJ SOLN
INTRAMUSCULAR | Status: AC
  Filled 2016-08-24: qty 2

## 2016-08-24 MED ORDER — BUPIVACAINE HCL (PF) 0.25 % IJ SOLN
INTRAMUSCULAR | Status: DC | PRN
  Administered 2016-08-24: 9 mL

## 2016-08-24 MED ORDER — EPHEDRINE 5 MG/ML INJ
INTRAVENOUS | Status: AC
  Filled 2016-08-24: qty 10

## 2016-08-24 MED ORDER — HYDROCODONE-ACETAMINOPHEN 5-325 MG PO TABS
1.0000 | ORAL_TABLET | Freq: Once | ORAL | Status: AC
  Administered 2016-08-24: 1 via ORAL
  Filled 2016-08-24: qty 1

## 2016-08-24 MED ORDER — LACTATED RINGERS IV SOLN
INTRAVENOUS | Status: DC
Start: 2016-08-24 — End: 2016-08-24
  Administered 2016-08-24: 09:00:00 via INTRAVENOUS
  Filled 2016-08-24: qty 1000

## 2016-08-24 MED ORDER — PROPOFOL 10 MG/ML IV BOLUS
INTRAVENOUS | Status: DC | PRN
  Administered 2016-08-24: 200 mg via INTRAVENOUS

## 2016-08-24 MED ORDER — EPHEDRINE SULFATE-NACL 50-0.9 MG/10ML-% IV SOSY
PREFILLED_SYRINGE | INTRAVENOUS | Status: DC | PRN
  Administered 2016-08-24: 10 mg via INTRAVENOUS

## 2016-08-24 MED ORDER — LIDOCAINE 2% (20 MG/ML) 5 ML SYRINGE
INTRAMUSCULAR | Status: AC
  Filled 2016-08-24: qty 5

## 2016-08-24 MED ORDER — FENTANYL CITRATE (PF) 100 MCG/2ML IJ SOLN
25.0000 ug | INTRAMUSCULAR | Status: DC | PRN
  Administered 2016-08-24: 25 ug via INTRAVENOUS
  Filled 2016-08-24: qty 1

## 2016-08-24 MED ORDER — DEXAMETHASONE SODIUM PHOSPHATE 10 MG/ML IJ SOLN
INTRAMUSCULAR | Status: AC
  Filled 2016-08-24: qty 1

## 2016-08-24 MED ORDER — CLINDAMYCIN PHOSPHATE 900 MG/50ML IV SOLN
900.0000 mg | INTRAVENOUS | Status: AC
  Administered 2016-08-24: 900 mg via INTRAVENOUS
  Filled 2016-08-24: qty 50

## 2016-08-24 MED ORDER — FENTANYL CITRATE (PF) 100 MCG/2ML IJ SOLN
INTRAMUSCULAR | Status: AC
  Filled 2016-08-24: qty 2

## 2016-08-24 MED ORDER — CHLORHEXIDINE GLUCONATE 4 % EX LIQD
60.0000 mL | Freq: Once | CUTANEOUS | Status: DC
Start: 2016-08-24 — End: 2016-08-24
  Filled 2016-08-24: qty 118

## 2016-08-24 MED ORDER — GLYCOPYRROLATE 0.2 MG/ML IV SOSY
PREFILLED_SYRINGE | INTRAVENOUS | Status: AC
  Filled 2016-08-24: qty 3

## 2016-08-24 MED ORDER — FENTANYL CITRATE (PF) 100 MCG/2ML IJ SOLN
INTRAMUSCULAR | Status: DC | PRN
  Administered 2016-08-24: 50 ug via INTRAVENOUS
  Administered 2016-08-24 (×2): 25 ug via INTRAVENOUS

## 2016-08-24 MED ORDER — PROMETHAZINE HCL 25 MG/ML IJ SOLN
6.2500 mg | INTRAMUSCULAR | Status: DC | PRN
  Filled 2016-08-24: qty 1

## 2016-08-24 MED ORDER — ONDANSETRON HCL 4 MG PO TABS: 4.0000 mg | ORAL_TABLET | Freq: Three times a day (TID) | ORAL | 0 refills | Status: DC | PRN

## 2016-08-24 MED ORDER — MIDAZOLAM HCL 2 MG/2ML IJ SOLN
INTRAMUSCULAR | Status: AC
  Filled 2016-08-24: qty 2

## 2016-08-24 MED ORDER — CLINDAMYCIN PHOSPHATE 900 MG/50ML IV SOLN
INTRAVENOUS | Status: AC
  Filled 2016-08-24: qty 50

## 2016-08-24 MED ORDER — ONDANSETRON HCL 4 MG/2ML IJ SOLN
INTRAMUSCULAR | Status: DC | PRN
  Administered 2016-08-24: 4 mg via INTRAVENOUS

## 2016-08-24 MED ORDER — DOCUSATE SODIUM 100 MG PO CAPS: 100.0000 mg | ORAL_CAPSULE | Freq: Two times a day (BID) | ORAL | 0 refills | Status: DC

## 2016-08-24 MED ORDER — HYDROCODONE-ACETAMINOPHEN 5-325 MG PO TABS
ORAL_TABLET | ORAL | Status: AC
  Filled 2016-08-24: qty 1

## 2016-08-24 SURGICAL SUPPLY — 44 items
BANDAGE ELASTIC 3 VELCRO ST LF (GAUZE/BANDAGES/DRESSINGS) ×4 IMPLANT
BANDAGE ELASTIC 4 VELCRO ST LF (GAUZE/BANDAGES/DRESSINGS) IMPLANT
BLADE SURG 15 STRL LF DISP TIS (BLADE) ×1 IMPLANT
BLADE SURG 15 STRL SS (BLADE) ×1
BNDG ESMARK 4X9 LF (GAUZE/BANDAGES/DRESSINGS) ×2 IMPLANT
BNDG GAUZE ELAST 4 BULKY (GAUZE/BANDAGES/DRESSINGS) ×2 IMPLANT
CORDS BIPOLAR (ELECTRODE) ×2 IMPLANT
COVER BACK TABLE 60X90IN (DRAPES) ×2 IMPLANT
COVER MAYO STAND STRL (DRAPES) IMPLANT
DRAIN PENROSE 18X1/4 LTX STRL (WOUND CARE) IMPLANT
DRAPE EXTREMITY T 121X128X90 (DRAPE) ×2 IMPLANT
DRAPE LG THREE QUARTER DISP (DRAPES) ×2 IMPLANT
DRAPE SURG 17X23 STRL (DRAPES) ×2 IMPLANT
DRSG EMULSION OIL 3X3 NADH (GAUZE/BANDAGES/DRESSINGS) ×2 IMPLANT
GAUZE XEROFORM 1X8 LF (GAUZE/BANDAGES/DRESSINGS) IMPLANT
GLOVE BIO SURGEON STRL SZ8 (GLOVE) ×2 IMPLANT
GLOVE BIOGEL PI IND STRL 8.5 (GLOVE) ×1 IMPLANT
GLOVE BIOGEL PI INDICATOR 8.5 (GLOVE) ×1
GOWN STRL REUS W/TWL LRG LVL3 (GOWN DISPOSABLE) ×2 IMPLANT
GOWN STRL REUS W/TWL XL LVL3 (GOWN DISPOSABLE) ×2 IMPLANT
KIT ROOM TURNOVER WOR (KITS) ×2 IMPLANT
LOOP VESSEL MAXI BLUE (MISCELLANEOUS) IMPLANT
NEEDLE HYPO 25X1 1.5 SAFETY (NEEDLE) ×2 IMPLANT
NS IRRIG 500ML POUR BTL (IV SOLUTION) ×2 IMPLANT
PACK BASIN DAY SURGERY FS (CUSTOM PROCEDURE TRAY) ×2 IMPLANT
PAD CAST 3X4 CTTN HI CHSV (CAST SUPPLIES) IMPLANT
PADDING CAST ABS 4INX4YD NS (CAST SUPPLIES) ×1
PADDING CAST ABS COTTON 4X4 ST (CAST SUPPLIES) ×1 IMPLANT
PADDING CAST COTTON 3X4 STRL (CAST SUPPLIES)
SPLINT PLASTER CAST XFAST 3X15 (CAST SUPPLIES) ×1 IMPLANT
SPLINT PLASTER XTRA FASTSET 3X (CAST SUPPLIES) ×1
SPONGE GAUZE 4X4 12PLY (GAUZE/BANDAGES/DRESSINGS) ×2 IMPLANT
STOCKINETTE 4X48 STRL (DRAPES) ×2 IMPLANT
SUT ETHILON 4 0 P 3 18 (SUTURE) IMPLANT
SUT ETHILON 5 0 P 3 18 (SUTURE)
SUT NYLON ETHILON 5-0 P-3 1X18 (SUTURE) IMPLANT
SUT PROLENE 4 0 PS 2 18 (SUTURE) ×6 IMPLANT
SUT PROLENE 5 0 P 3 (SUTURE) ×2 IMPLANT
SYR BULB 3OZ (MISCELLANEOUS) ×2 IMPLANT
SYR CONTROL 10ML LL (SYRINGE) IMPLANT
TOWEL OR 17X24 6PK STRL BLUE (TOWEL DISPOSABLE) ×4 IMPLANT
TRAY DSU PREP LF (CUSTOM PROCEDURE TRAY) ×2 IMPLANT
TUBE CONNECTING 12X1/4 (SUCTIONS) IMPLANT
UNDERPAD 30X30 INCONTINENT (UNDERPADS AND DIAPERS) ×2 IMPLANT

## 2016-08-24 NOTE — Anesthesia Procedure Notes (Signed)
Procedure Name: LMA Insertion Date/Time: 08/24/2016 10:11 AM Performed by: Denna Haggard D Pre-anesthesia Checklist: Patient identified, Emergency Drugs available, Suction available and Patient being monitored Patient Re-evaluated:Patient Re-evaluated prior to inductionOxygen Delivery Method: Circle system utilized Preoxygenation: Pre-oxygenation with 100% oxygen Intubation Type: IV induction Ventilation: Mask ventilation without difficulty LMA: LMA inserted LMA Size: 4.0 Number of attempts: 1 Airway Equipment and Method: Bite block Placement Confirmation: positive ETCO2 Tube secured with: Tape Dental Injury: Teeth and Oropharynx as per pre-operative assessment

## 2016-08-24 NOTE — Discharge Instructions (Signed)
Dupuytren's Contracture Surgery, Care After Surgery is done to straighten fingers of patients that have Dupuytren's contracture. Most people do not feel pain because of Dupuytren's contracture. The goal of surgery is to make the fingers move better. This includes being able to bend them and make them fully straighten again. PROCEDURE  The surgeon made one or more cuts (incisions) in the palm of your hand.  Tissue below the skin that got thick was removed. This thick tissue is called fascia. This thickness is what started the problem. It pulls on the cords (tendons) that control the fingers. This caused them to curl in. The surgeon took out this thickened tissue. After the surgery the fingers should be able to fully straighten again.  The incisions were stitched closed. A bandage (dressing) was put over the incision. Your bandage may include a splint (a plaster or fiberglass piece) that will keep your fingers from moving.  Sometimes, some of the skin on the palm of the hand is removed. This happens when thickened tissue is attached to the skin. If so, a skin graft may have been done. A skin graft is a piece of skin taken from another part of the body. It becomes a patch for the skin that was removed. Sometimes the palm incision is left open by the surgeon to slowly heal over weeks to months- this is called the "open palm" technique. AFTER THE PROCEDURE  You will stay in a recovery area until the anesthesia has worn off. Your blood pressure and pulse will be checked every so often. You may continue to get fluids through the IV for awhile.  Some pain is normal after this surgery. You will probably be given pain medicine while in the recovery area.  Most of the time, this surgery is an outpatient procedure. That means you will be able to go home the same day. Otherwise, you will be moved to a regular hospital room. HOME CARE INSTRUCTIONS   Take whatever pain medication has been prescribed by the  surgeon. Follow the directions carefully. Do not take over-the-counter painkillers unless the surgeon says it is OK.  Do not drive if you are taking prescription pain medicine.  Rest for the first day or two at home. After that, you can slowly return to most of your normal activities. Avoid things that involve the use of your hand.  Keep your hand raised (elevated). When you are resting, it should be above the level of your heart. Piling up several pillows should do this. Keep your arm held in the air or in a sling when standing or walking.  Check your fingers every 4 to 6 hours. They should look and feel like your other fingers. They should be the same color and temperature. Call your healthcare provider if they are not.  Let your healthcare provider know if your hand swells, or becomes painful or if your hand or fingers feel tingly or numb.  Do not get the dressing and splint wet. Take a sponge bath. Or, put a plastic bag over your hand. Tape it tightly around your arm. Then, shower. You will need to do this until your stitches are gone, or until the surgeon says it is OK to get your hand wet.  Be sure to keep all follow-up appointments.  You will probably need to go back to the surgeon to have the dressing changed. This is usually done about a week after the surgery. A new dressing may be put on.  You  may need to come back again to have the stitches taken out. That usually happens 10 to 14 days after the surgery.  Occupational or hand therapy after surgery for Dupuytren's contracture is important. It usually includes stretching exercises for the hand. It also may include massage. This therapy may be done several times a week for a few months. It may take up to 8 weeks until you have full movement of your fingers again.  You may need to wear a splint at night for 3 - 6 months after your surgery. SEEK MEDICAL CARE IF:   You have any questions about medicines that were prescribed or  suggested.  Your hand hurts, even after taking pain medicine.  You have numbness or tingling in your hand or fingers.  The dressing gets wet.  You develop a fever of more than 100.5 F (38.1 C). SEEK IMMEDIATE MEDICAL CARE IF:   Pain in your hand gets much worse.  Your fingers change color or become cold or numb.  You see any blood on or around your dressing.  The incision becomes red or swollen, bleeds or oozes pus.  You develop a fever of more than 102.0 F (38.9 C).   This information is not intended to replace advice given to you by your health care provider. Make sure you discuss any questions you have with your health care provider.   Document Released: 10/09/2009 Document Revised: 01/02/2015 Document Reviewed: 10/09/2009 Elsevier Interactive Patient Education 2016 [place] AND [facility] CALL OFFICE FOR F/U APPT [phone] IN 8 DAYS KEEP HAND ELEVATED ABOVE HEART OK TO APPLY ICE TO OPERATIVE AREA CONTACT OFFICE IF ANY WORSENING PAIN OR CONCERNS.   Post Anesthesia Home Care Instructions  Activity: Get plenty of rest for the remainder of the day. A responsible adult should stay with you for 24 hours following the procedure.  For the next 24 hours, DO NOT: -Drive a car -Paediatric nurse -Drink alcoholic beverages -Take any medication unless instructed by your physician -Make any legal decisions or sign important papers.  Meals: Start with liquid foods such as gelatin or soup. Progress to regular foods as tolerated. Avoid greasy, spicy, heavy foods. If nausea and/or vomiting occur, drink only clear liquids until the nausea and/or vomiting subsides. Call your physician if vomiting continues.  Special Instructions/Symptoms: Your throat may feel dry or sore from the anesthesia or the breathing tube placed in your throat during surgery. If this causes discomfort, gargle with warm salt water. The discomfort should disappear within 24 hours.  If you  had a scopolamine patch placed behind your ear for the management of post- operative nausea and/or vomiting:  1. The medication in the patch is effective for 72 hours, after which it should be removed.  Wrap patch in a tissue and discard in the trash. Wash hands thoroughly with soap and water. 2. You may remove the patch earlier than 72 hours if you experience unpleasant side effects which may include dry mouth, dizziness or visual disturbances. 3. Avoid touching the patch. Wash your hands with soap and water after contact with the patch.

## 2016-08-24 NOTE — H&P (Signed)
Bailey [name] is an 60 y.o. female.   Chief Complaint: Left hand contracture HPI: Pt followed in office with bilateral dupuytrens contractre Pt here for surgery Pt unable to place hand flat on table Pt with diabetes  Past Medical History:  Diagnosis Date  . Arthritis    RIGHT shoulder, knees  . Cervical radiculopathy at C5   . Dupuytren's contracture of both hands    L > R  . Dyslipidemia   . History of adenomatous polyp of colon    1997 and 2003    . History of chronic pelvic inflammatory disease   . History of gastric ulcer    2008  . Hypertriglyceridemia   . Right carpal tunnel syndrome   . Type 2 diabetes mellitus, uncontrolled ([place])    last A1c 8.9 on 07-07-2016  . Vitamin D deficiency     Past Surgical History:  Procedure Laterality Date  . COLONOSCOPY  last one 07-15-2016  . ESOPHAGOGASTRODUODENOSCOPY  last one 2008  . LAPAROSCOPIC CHOLECYSTECTOMY  07/17/2000  . ROTATOR CUFF REPAIR Right 2000  . TOTAL ABDOMINAL HYSTERECTOMY W/ BILATERAL SALPINGOOPHORECTOMY  1998    Family History  Problem Relation Age of Onset  . Diabetes Mother   . Hypertension Mother   . Dementia Mother   . Pancreatitis Mother   . Asthma Mother   . COPD Mother   . Stroke Mother   . Hyperlipidemia Mother   . Heart disease Mother   . Hypertension Father   . Hypertension Sister   . Diabetes Sister   . Hyperlipidemia Sister   . Hypertension Brother   . Diabetes Brother   . Drug abuse Brother   . Cancer Brother     colon  . Hyperlipidemia Brother   . Hyperlipidemia Brother   . Hypertension Brother   . Hyperlipidemia Sister   . Hypertension Sister   . Diabetes Sister   . Diabetes Maternal Grandmother    Social History:  reports that she has been smoking.  She has a 14.00 pack-year smoking history. She has never used smokeless tobacco. She reports that she does not drink alcohol or use drugs.  Allergies:  Allergies  Allergen Reactions  . Metformin And Related Diarrhea  .  Shellfish Allergy Anaphylaxis, Hives and Swelling    Lobster, crab (shrimp's ok)  . Ivp Dye [Iodinated Diagnostic Agents] Hives  . Adhesive [Tape] Other (See Comments)    blisters  . Codeine Other (See Comments)    Unknown childhood allergy  . Latex Other (See Comments)    blisters  . Penicillins Other (See Comments)    Unknown childhood reaction    No prescriptions prior to admission.    No results found for this or any previous visit (from the past 48 hour(s)). No results found.  ROS NO RECENT ILLNESSES OR HOSPITALIZATIONS  Height 5\' 2"  (1.575 m), weight 148 lb (67.1 kg). Physical Exam  General Appearance:  Alert, cooperative, no distress, appears stated age  Head:  Normocephalic, without obvious abnormality, atraumatic  Eyes:  Pupils equal, conjunctiva/corneas clear,         Throat: Lips, mucosa, and tongue normal; teeth and gums normal  Neck: No visible masses     Lungs:   respirations unlabored  Chest Wall:  No tenderness or deformity  Heart:  Regular rate and rhythm,  Abdomen:   Soft, non-tender,         Extremities: LEFT HAND: UNABLE TO PLACE HAND FLAT ON TABLE FINGERS WARM WELL PERFUSED GOOD  WRIST AND DIGITAL FLEXION NVI LEFT HAND  Pulses: 2+ and symmetric  Skin: Skin color, texture, turgor normal, no rashes or lesions     Neurologic: Normal   Assessment/Plan LEFT HAND DUPUYTRENS, HAND AND SMALL FINGER  LEFT HAND PALMAR FASCIECTOMY AND DIGITAL FASCIECTOMY AND JOINT RELEASE  R/B/A DISCUSSED WITH PT IN OFFICE.  PT VOICED UNDERSTANDING OF PLAN CONSENT SIGNED DAY OF SURGERY PT SEEN AND EXAMINED PRIOR TO OPERATIVE PROCEDURE/DAY OF SURGERY SITE MARKED. QUESTIONS ANSWERED WILL GO HOME FOLLOWING SURGERY  WE ARE PLANNING SURGERY FOR YOUR UPPER EXTREMITY. THE RISKS AND BENEFITS OF SURGERY INCLUDE BUT NOT LIMITED TO BLEEDING INFECTION, DAMAGE TO NEARBY NERVES ARTERIES TENDONS, FAILURE OF SURGERY TO ACCOMPLISH ITS INTENDED GOALS, PERSISTENT SYMPTOMS AND NEED FOR  FURTHER SURGICAL INTERVENTION. WITH THIS IN MIND WE WILL PROCEED. I HAVE DISCUSSED WITH THE PATIENT THE PRE AND POSTOPERATIVE REGIMEN AND THE DOS AND DON'TS. PT VOICED UNDERSTANDING AND INFORMED CONSENT SIGNED.  Linna Hoff 08/24/2016, 0950 AM

## 2016-08-24 NOTE — Anesthesia Preprocedure Evaluation (Addendum)
Anesthesia Evaluation  Patient identified by MRN, date of birth, ID band Patient awake    Reviewed: Allergy & Precautions, NPO status , Patient's Chart, lab work & pertinent test results  Airway Mallampati: II  TM Distance: >3 FB Neck ROM: Full    Dental  (+)    Pulmonary Current Smoker,    Pulmonary exam normal breath sounds clear to auscultation       Cardiovascular negative cardio ROS Normal cardiovascular exam Rhythm:Regular Rate:Normal     Neuro/Psych  Neuromuscular disease negative psych ROS   GI/Hepatic negative GI ROS, Neg liver ROS,   Endo/Other  diabetes, Poorly Controlled, Type 2  Renal/GU negative Renal ROS  negative genitourinary   Musculoskeletal  (+) Arthritis ,   Abdominal   Peds negative pediatric ROS (+)  Hematology negative hematology ROS (+)   Anesthesia Other Findings   Reproductive/Obstetrics negative OB ROS                            Anesthesia Physical Anesthesia Plan  ASA: III  Anesthesia Plan: General   Post-op Pain Management:    Induction: Intravenous  Airway Management Planned: LMA  Additional Equipment:   Intra-op Plan:   Post-operative Plan: Extubation in OR  Informed Consent: I have reviewed the patients History and Physical, chart, labs and discussed the procedure including the risks, benefits and alternatives for the proposed anesthesia with the patient or authorized representative who has indicated his/her understanding and acceptance.   Dental advisory given  Plan Discussed with: CRNA  Anesthesia Plan Comments:         Anesthesia Quick Evaluation

## 2016-08-24 NOTE — Transfer of Care (Signed)
Immediate Anesthesia Transfer of Care Note  Patient: Bailey [name]  Procedure(s) Performed: Procedure(s) (LRB): LEFT HAND PALMER AND SMALL FINGER DIGiTAL FASCIECTOMY AND RELEASE (Left)  Patient Location: PACU  Anesthesia Type: General  Level of Consciousness: awake, oriented, sedated and patient cooperative  Airway & Oxygen Therapy: Patient Spontanous Breathing and Patient connected to face mask oxygen  Post-op Assessment: Report given to PACU RN and Post -op Vital signs reviewed and stable  Post vital signs: Reviewed and stable  Complications: No apparent anesthesia complications  Last Vitals:  Vitals:   08/24/16 0834 08/24/16 1126  BP: 131/67 138/78  Pulse: 72 (!) 102  Resp: 16 13  Temp: 36.6 C 36.9 C

## 2016-08-24 NOTE — Anesthesia Postprocedure Evaluation (Signed)
Anesthesia Post Note  Patient: ANEVAY MARTS  Procedure(s) Performed: Procedure(s) (LRB): LEFT HAND PALMER AND SMALL FINGER DIGiTAL FASCIECTOMY AND RELEASE (Left)  Patient location during evaluation: PACU Anesthesia Type: General Level of consciousness: awake and alert Pain management: pain level controlled Vital Signs Assessment: post-procedure vital signs reviewed and stable Respiratory status: spontaneous breathing, nonlabored ventilation, respiratory function stable and patient connected to nasal cannula oxygen Cardiovascular status: blood pressure returned to baseline and stable Postop Assessment: no signs of nausea or vomiting Anesthetic complications: no    Last Vitals:  Vitals:   08/24/16 1126 08/24/16 1200  BP: 138/78 124/76  Pulse: (!) 102 82  Resp: 13 10  Temp: 36.9 C     Last Pain:  Vitals:   08/24/16 1300  TempSrc:   PainSc: 9                  Julie Nay J

## 2016-08-25 ENCOUNTER — Encounter (HOSPITAL_BASED_OUTPATIENT_CLINIC_OR_DEPARTMENT_OTHER): Payer: Self-pay | Admitting: Orthopedic Surgery

## 2016-08-25 NOTE — Op Note (Signed)
NAMENICEY, KECK             ACCOUNT NO.:  [id number]  MEDICAL RECORD NO.:  [id number]  LOCATION:                                 FACILITY:  PHYSICIAN:  Linna Hoff IV, M.D.DATE OF BIRTH:  26-Apr-1956  DATE OF PROCEDURE: DATE OF DISCHARGE:                              OPERATIVE REPORT   PREOPERATIVE DIAGNOSIS:  Left hand small finger Dupuytren's involving the palm and PIP joint and MP joint.  POSTOPERATIVE DIAGNOSIS:  Left hand small finger Dupuytren's involving the palm and PIP joint and MP joint.  ATTENDING PHYSICIAN:  Linna Hoff, M.D. who scrubbed and present for the entire procedure.  ASSISTANT SURGEON:  Nehemiah Massed, [facility] who scrubbed and was present for the aid and exposure, dissection and closure, application of splint to help expedite the procedure in a timely manner.  SURGICAL PROCEDURES: 1. Left small finger partial palmar and digital fasciectomy with     release of the PIP joint. 2. Left small finger digital neuroplasty radial, ulnar, and digital     nerves.  SURGICAL SPECIMENS:  Dupuytren's disease pathology.  SURGICAL INDICATION:  Mrs. Morreale is a right-hand-dominant female with persistent and worsening contractions of left small finger.  The patient is to undergo the above procedure.  Risks, benefits, and alternatives were discussed in detail with the patient.  Signed informed consent was obtained.  Risks include, but not limited to bleeding, infection, damage to nearby nerves, arteries, or tendons; loss motion of wrist and digits, incomplete relief of symptoms and need for further surgical intervention.  DESCRIPTION OF PROCEDURE:  The patient was properly identified in the preoperative holding area, marked with a permanent marker made on the left small finger to indicate correct operative site.  The patient was then brought back to the supine on the anesthesia table.  General anesthesia was administered.  The patient tolerated this well.  A  well- padded tourniquet placed on left brachium, sealed with a 1000 drape. Left upper extremity was then prepped and draped in normal sterile fashion.  Time-out was called, correct side was identified, and procedure then begun.  Attention then turned the left hand where the limb was then elevated and tourniquet insufflated.  Per incision made directly in the small finger extending from the DIP joint into the palm. Skin flaps were raised.  Dissection was carried down through the skin and subcutaneous tissue, with diseased tissue was then identified. Careful neuroplasty was then carried out of the radial ulnar neurovascular bundles.  The neurovascular bundles were carefully dissected free from the diseased tissue all the way extending from the DIP joint proximally.  After careful dissection, neuroplasty of the digital vessels, they were wrapped in the diseased tissue more so distally, sustained surgery on the flexor sheath.  The nerves were then protected.  Following this, the patient did have the abductor digiti minimi disease cord and this was resected.  Remaining diseased tissue was then resected in the palm in the digit and releasing the PIP joint. The wound was then thoroughly irrigated after joint release and removal of the diseased tissue in the digit in the palm.  Tourniquet deflated. Hemostasis was then obtained.  The skin was then closed  using simple Prolene sutures.  Adaptic dressing, sterile compressive bandage were applied.  The patient tolerated the procedure well returned to recovery room in good condition.  POSTPROCEDURE PLAN:  The patient will be discharged home, seen back in office in approximately 7 days for wound check, downstairs therapist for ulnar gutter hand based splint.  I will see me back at the 2-week mark for wound check, suture removal, and continue the therapy regimen.  No radiographs for the first visit.     Melrose Nakayama,  M.D.   ______________________________ Melrose Nakayama, M.D.    FWO/MEDQ  D:  08/24/2016  T:  08/24/2016  Job:  [id number]

## 2016-09-01 ENCOUNTER — Ambulatory Visit: Payer: Medicaid Other | Attending: Orthopedic Surgery | Admitting: Occupational Therapy

## 2016-09-01 DIAGNOSIS — M6281 Muscle weakness (generalized): Secondary | ICD-10-CM | POA: Diagnosis present

## 2016-09-01 DIAGNOSIS — M79642 Pain in left hand: Secondary | ICD-10-CM | POA: Diagnosis present

## 2016-09-01 DIAGNOSIS — M25642 Stiffness of left hand, not elsewhere classified: Secondary | ICD-10-CM

## 2016-09-01 NOTE — Patient Instructions (Addendum)
   Wear your splint in between exercise sessions and at night.  For all Exercises, make sure you come back up into full extension (fully straighten fingers).  Flexor Tendon Gliding (Active Hook Fist)   With fingers and knuckles straight, bend middle and tip joints. Do not bend large knuckles. Repeat _10-15___ times. Do 6-8_ sessions per day.  MP Flexion (Active)   With back of hand on table, bend large knuckles as far as they will go, keeping small joints straight. Repeat _10-15___ times. Do _6-8__ sessions per day. Activity: Reach into a narrow container.*      Finger Flexion / Extension   With palm up, bend fingers of left hand toward palm, making a  fist. Straighten fingers, opening fist. Repeat sequence _10-15___ times per session. Do 6-8__ sessions per day. Hand Variation: Palm down   PROM: Finger MP Joints   Passively bend ________ finger of hand at big knuckle until stretch is felt. Hold _10___ seconds. Relax. Straighten finger as far as possible. Repeat __5__ times per set.  Do _6-8_ sessions per day.   PIP Flexion (Passive)   Use other hand to bend the middle joint of Small and Ring fingers down as far as possible. Hold _10___ seconds. Repeat __5__ times. Do _4-6___ sessions per day.    PROM: Finger DIP Joints   Passively bend Small and Ring finger(s) of  hand at tip joint until stretch is felt. Hold __10__ seconds. Relax. Straighten finger as far as possible. Repeat _5___ times per set.  Do __4-6__ sessions per day.      WEARING SCHEDULE:  Wear splint at ALL times except for hygiene care (May remove splint for exercises and then immediately place back on). Wear at night too!  PURPOSE:  For protection until injury can heal  CARE OF SPLINT:  Keep splint away from heat sources including: stove, radiator or furnace, or a car in sunlight. The splint can melt and will no longer fit you properly  Keep away from pets and children  Clean the splint with  rubbing alcohol 1-2 times per day.  * During this time, make sure you also clean your hand/arm as instructed by your therapist and/or perform dressing changes as needed. Then dry hand/arm completely before replacing splint. (When cleaning hand/arm, keep it immobilized in same position until splint is replaced)  PRECAUTIONS/POTENTIAL PROBLEMS: *If you notice or experience increased pain, swelling, numbness, or a lingering reddened area from the splint: Contact your therapist immediately by calling [phone]. You must wear the splint for protection, but we will get you scheduled for adjustments as quickly as possible.  (If only straps or hooks need to be replaced and NO adjustments to the splint need to be made, just call the office ahead and let them know you are coming in)  If you have any medical concerns or signs of infection, please call your doctor immediately   Redmond Baseman, OTR/L Curt Bears Rine, OTR/L

## 2016-09-01 NOTE — Therapy (Signed)
[facility] [address] [place], [place], [address] Phone: [phone]   Fax:  [phone]  Occupational Therapy Evaluation  Patient Details  Name: Bailey [name] MRN: [id number] Date of Birth: 1956/01/15 Referring Provider: Dr. Iran Planas  Encounter Date: 09/01/2016      OT End of Session - 09/01/16 1200    Visit Number 1   Authorization Type MCD - awaiting authorization   OT Start Time 1020   OT Stop Time 1145   OT Time Calculation (min) 85 min   Equipment Utilized During Treatment splint   Activity Tolerance Patient tolerated treatment well      Past Medical History:  Diagnosis Date  . Arthritis    RIGHT shoulder, knees  . Cervical radiculopathy at C5   . Dupuytren's contracture of both hands    L > R  . Dyslipidemia   . History of adenomatous polyp of colon    1997 and 2003    . History of chronic pelvic inflammatory disease   . History of gastric ulcer    2008  . Hypertriglyceridemia   . Right carpal tunnel syndrome   . Type 2 diabetes mellitus, uncontrolled ([place])    last A1c 8.9 on 07-07-2016  . Vitamin D deficiency     Past Surgical History:  Procedure Laterality Date  . COLONOSCOPY  last one 07-15-2016  . ESOPHAGOGASTRODUODENOSCOPY  last one 2008  . FASCIECTOMY Left 08/24/2016   Procedure: LEFT HAND PALMER AND SMALL FINGER DIGiTAL FASCIECTOMY AND RELEASE;  Surgeon: Iran Planas, MD;  Location: [facility];  Service: Orthopedics;  Laterality: Left;  . LAPAROSCOPIC CHOLECYSTECTOMY  07/17/2000  . ROTATOR CUFF REPAIR Right 2000  . TOTAL ABDOMINAL HYSTERECTOMY W/ BILATERAL SALPINGOOPHORECTOMY  1998    There were no vitals filed for this visit.      Subjective Assessment - 09/01/16 1036    Subjective  The pain is increasing because I haven't taken my pain meds today yet   Pertinent History see EPIC snapshot, Bilateral CTS, Rt rotator cuff repair x 2, arthritis   Limitations no strengthening, wear splint b/t exercises and at night   Patient Stated Goals Get my Lt hand better, so I can have surgery to the Rt   Currently in Pain? Yes   Pain Score 6    Pain Location Hand   Pain Orientation Left   Pain Descriptors / Indicators Throbbing   Pain Type Surgical pain   Pain Onset 1 to 4 weeks ago   Pain Frequency Constant   Aggravating Factors  movement   Pain Relieving Factors pain meds           [facility] OT Assessment - 09/01/16 0001      Assessment   Diagnosis Dupuytren's contracture, s/p palmar and digital fasciotomy   Referring Provider Dr. Iran Planas   Onset Date 08/24/16  surgery date   Assessment Pt came from MD office with soft gauze wrap around incision. MD office had removed soft cast in prep for O.T. appt   Prior Therapy none     Precautions   Precautions Other (comment)   Precaution Comments no strengthening at this time   Required Braces or Orthoses Other Brace/Splint   Other Brace/Splint hand based splint in full extension per protocol     Restrictions   Weight Bearing Restrictions No     Home  Environment   Lives With Alone  however, husband currently staying with her to assist w/ ADL  Prior Function   Level of Independence Independent   Vocation Unemployed     ADL   Eating/Feeding Independent   Grooming Independent   Upper Body Bathing Moderate assistance   Lower Body Bathing Moderate assistance   ADL comments Dressing mod I with slip on shoes, elastic pants. Assist for cooking and cleaning     Written Expression   Dominant Hand Right     Left Hand AROM   L Ring  MCP 0-90 65 Degrees   L Ring PIP 0-100 95 Degrees   L Ring DIP 0-70 45 Degrees   L Little  MCP 0-90 38 Degrees  full ext   L Little PIP 0-100 50 Degrees  -5 ext   L Little DIP 0-70 35 Degrees  -5 ext                  OT Treatments/Exercises (OP) - 09/01/16 0001      ADLs   ADL Comments Unwrapped gauze, cleaned and dried hand  and applied tensogrip prior to splint fabrication. Pt educated in hygiene care, splint wear and care, and scar massage once stitches are removed (next week)      Exercises   Exercises Hand     Hand Exercises   Other Hand Exercises Pt issued A/ROM and P/ROM HEP - see pt instructions for details.      Splinting   Splinting Fabricated and fitted hand based full extension splint per protocol. Issued splint               OT Education - 09/01/16 1130    Education provided Yes   Education Details Splint wear and care, hygiene care, scar massage (reviewed verbally once stitches are removed next week), A/ROM and P/ROM HEP per protocol   Person(s) Educated Patient   Methods Explanation;Demonstration;Handout   Comprehension Verbalized understanding;Returned demonstration          OT Short Term Goals - 09/01/16 1230      OT SHORT TERM GOAL #1   Title Independent with splint wear and care   Baseline issued, may need adjustments   Time 4   Period Weeks   Status On-going     OT SHORT TERM GOAL #2   Title Independent with scar massage   Baseline verbally reviewed, but unable to do until stitches removed next week   Status On-going     OT [place] #3   Title Independent with initial HEP    Baseline issued, may need review/updates   Time 4   Period Weeks   Status On-going           OT Long Term Goals - 09/01/16 1231      OT LONG TERM GOAL #1   Title Independent with strengthening HEP    Baseline Dependent d/t current precautions   Time 8   Period Weeks   Status New     OT LONG TERM GOAL #2   Title Pt to demo 20 degree or greater improvement in all joints Lt small finger   Baseline MP = 38*, PIP = 50*, DIP = 35*   Time 8   Period Weeks   Status New     OT LONG TERM GOAL #3   Title Pt to return to using Lt hand as assist for all bilateral tasks   Baseline dependent   Time 8   Period Weeks   Status New     OT LONG TERM GOAL #4   Title  Grip strength  Lt hand to be 25 lbs or greater to assist with opening jars/containers   Baseline unable to assess d/t current precautions   Time 8   Period Weeks   Status New     OT LONG TERM GOAL #5   Title Pain less than or equal to 3/10 with all ADL's   Baseline up to 8/10    Time 8   Period Weeks   Status New               Plan - 09/01/16 1201    Clinical Impression Statement Pt is a 60 y.o. female who presents to outpatient rehab s/p palmar and digital fasciotomy [id number]) Lt hand on 08/24/16 for Dupuytren's contracture.  Pt presents today with palmar incision, pain, and limited small and ring finger motion which affects ability to perform ADLS, IADLS.    Rehab Potential Good   OT Frequency --  3 visits over 10 week duration (due to protocol)   OT Treatment/Interventions Self-care/ADL training;Moist Heat;Fluidtherapy;DME and/or AE instruction;Splinting;Patient/family education;Compression bandaging;Ultrasound;Therapeutic exercise;Scar mobilization;Therapeutic activities;Cryotherapy;Passive range of motion;Electrical Stimulation;Parrafin;Manual Therapy   Plan progress per protocol as able, check for MCD authorization   Consulted and Agree with Plan of Care Patient      Patient will benefit from skilled therapeutic intervention in order to improve the following deficits and impairments:  Decreased range of motion, Impaired flexibility, Decreased safety awareness, Increased edema, Impaired sensation, Decreased knowledge of precautions, Decreased skin integrity, Impaired UE functional use, Pain, Decreased scar mobility, Decreased strength  Visit Diagnosis: Pain in left hand - Plan: Ot plan of care cert/re-cert  Stiffness of left hand, not elsewhere classified - Plan: Ot plan of care cert/re-cert  Muscle weakness (generalized) - Plan: Ot plan of care cert/re-cert    Problem List Patient Active Problem List   Diagnosis Date Noted  . Disability examination 06/16/2016  . Right sided  weakness 12/24/2015  . Dyslipidemia with low high density lipoprotein (HDL) cholesterol with hypertriglyceridemia due to type 2 diabetes mellitus ([facility]) 11/16/2015  . Uncontrolled type 2 diabetes mellitus with hyperglycemia ([facility]) 11/12/2015  . Menopausal symptoms 11/12/2015  . Dupuytren's contracture of both hands 07/05/2013  . Need for Tdap vaccination 07/05/2013  . Arthritis     Carey Bullocks, OTR/L 09/01/2016, 12:38 PM  [facility] [address] [place], [place], [address] Phone: [phone]   Fax:  [phone]  Name: Bailey [name] MRN: [id number] Date of Birth: 1956/02/11

## 2016-09-22 ENCOUNTER — Ambulatory Visit: Payer: Medicaid Other | Admitting: Occupational Therapy

## 2016-09-22 DIAGNOSIS — M79642 Pain in left hand: Secondary | ICD-10-CM

## 2016-09-22 DIAGNOSIS — M25642 Stiffness of left hand, not elsewhere classified: Secondary | ICD-10-CM

## 2016-09-22 NOTE — Therapy (Signed)
[facility] [address] [place], [place], [address] Phone: [phone]   Fax:  [phone]  Occupational Therapy Treatment  Patient Details  Name: Bailey [name] MRN: [id number] Date of Birth: 12-Aug-1956 Referring Provider: Dr. Iran Planas  Encounter Date: 09/22/2016      OT End of Session - 09/22/16 1652    Visit Number 2   Authorization Type MCD   Authorization Time Period Approved 3 visits from 09/21/16 - 11/15/16   OT Start Time 1530   OT Stop Time 1630   OT Time Calculation (min) 60 min   Activity Tolerance Patient tolerated treatment well      Past Medical History:  Diagnosis Date  . Arthritis    RIGHT shoulder, knees  . Cervical radiculopathy at C5   . Dupuytren's contracture of both hands    L > R  . Dyslipidemia   . History of adenomatous polyp of colon    1997 and 2003    . History of chronic pelvic inflammatory disease   . History of gastric ulcer    2008  . Hypertriglyceridemia   . Right carpal tunnel syndrome   . Type 2 diabetes mellitus, uncontrolled ([facility])    last A1c 8.9 on 07-07-2016  . Vitamin D deficiency     Past Surgical History:  Procedure Laterality Date  . COLONOSCOPY  last one 07-15-2016  . ESOPHAGOGASTRODUODENOSCOPY  last one 2008  . FASCIECTOMY Left 08/24/2016   Procedure: LEFT HAND PALMER AND SMALL FINGER DIGiTAL FASCIECTOMY AND RELEASE;  Surgeon: Iran Planas, MD;  Location: [facility];  Service: Orthopedics;  Laterality: Left;  . LAPAROSCOPIC CHOLECYSTECTOMY  07/17/2000  . ROTATOR CUFF REPAIR Right 2000  . TOTAL ABDOMINAL HYSTERECTOMY W/ BILATERAL SALPINGOOPHORECTOMY  1998    There were no vitals filed for this visit.      Subjective Assessment - 09/22/16 1538    Pertinent History Dupuytren's with fasciotomy on 08/24/16.  See EPIC snapshot, Bilateral CTS, Rt rotator cuff repair x 2, arthritis   Limitations no strengthening, wear splint b/t  exercises and at night   Patient Stated Goals Get my Lt hand better, so I can have surgery to the Rt   Currently in Pain? Yes   Pain Score 5    Pain Location Hand   Pain Orientation Left   Pain Descriptors / Indicators Throbbing   Pain Type Surgical pain   Pain Onset More than a month ago   Pain Frequency Intermittent   Aggravating Factors  movement   Pain Relieving Factors pain meds, cold pack                       OT Treatments/Exercises (OP) - 09/22/16 0001      ADLs   ADL Comments Pt reports she has not been doing scar massage since stitches removed, and pt has raised scar with significant scar tissue. Pt educated in scar massage and therapist demo x 5 minutes. Pt also issued gel padding to wear at night with splint for scar management. Pt also with mild edema Lt hand: pt instructed in retrograde massage, and issued Lt small compression glove to wear at night with splint. Pt instructed in how to clean hand, clean gel padding, dry hand thoroughly, apply gel padding, compression glove, then splint at night.      Hand Exercises   Other Hand Exercises Reviewed all A/ROM and P/ROM ex's from HEP. Therapist performed P/ROM ex's and place  and hold ex's compositely and 5th digit isolated. Stressed importance of full finger extension and stretch b/t each rep and each ex     Modalities   Modalities Ultrasound     Ultrasound   Ultrasound Location Lt palm and base of 5th digit along incision   Ultrasound Parameters 3 Mhz, 20% pulsed, 0.8 wts/cm2 x 8 minutes   Ultrasound Goals --  scar management, desensitization     Splinting   Splinting Provided all new straps for hand splint and 2 new tensogrip stockinettes. Pt also instructed to wean splint 1 hour each day (per 4 week protocol), but to continue wearing all night.                   OT Short Term Goals - 09/22/16 1653      OT SHORT TERM GOAL #1   Title Independent with splint wear and care   Baseline issued,  may need adjustments   Time 4   Period Weeks   Status Achieved     OT SHORT TERM GOAL #2   Title Independent with scar massage   Baseline verbally reviewed, but unable to do until stitches removed next week   Status Achieved     OT [place] #3   Title Independent with initial HEP    Baseline issued, may need review/updates   Time 4   Period Weeks   Status Achieved           OT Long Term Goals - 09/01/16 1231      OT LONG TERM GOAL #1   Title Independent with strengthening HEP    Baseline Dependent d/t current precautions   Time 8   Period Weeks   Status New     OT LONG TERM GOAL #2   Title Pt to demo 20 degree or greater improvement in all joints Lt small finger   Baseline MP = 38*, PIP = 50*, DIP = 35*   Time 8   Period Weeks   Status New     OT LONG TERM GOAL #3   Title Pt to return to using Lt hand as assist for all bilateral tasks   Baseline dependent   Time 8   Period Weeks   Status New     OT LONG TERM GOAL #4   Title Grip strength Lt hand to be 25 lbs or greater to assist with opening jars/containers   Baseline unable to assess d/t current precautions   Time 8   Period Weeks   Status New     OT LONG TERM GOAL #5   Title Pain less than or equal to 3/10 with all ADL's   Baseline up to 8/10    Time 8   Period Weeks   Status New               Plan - 09/22/16 1653    Clinical Impression Statement Pt has met all STG's. Pt with significant scar tissue and mild edema but improved by end of session. Pt still very stiff in Lt hand, especially 5th digit.    Rehab Potential Good   OT Treatment/Interventions Self-care/ADL training;Moist Heat;Fluidtherapy;DME and/or AE instruction;Splinting;Patient/family education;Compression bandaging;Ultrasound;Therapeutic exercise;Scar mobilization;Therapeutic activities;Cryotherapy;Passive range of motion;Electrical Stimulation;Parrafin;Manual Therapy   Plan continue pulsed [place], A/ROM, P/ROM, begin light  strengthening with putty per 6-8 week protocol (see protocol in folder)   Consulted and Agree with Plan of Care Patient      Patient will benefit from skilled  therapeutic intervention in order to improve the following deficits and impairments:  Decreased range of motion, Impaired flexibility, Decreased safety awareness, Increased edema, Impaired sensation, Decreased knowledge of precautions, Decreased skin integrity, Impaired UE functional use, Pain, Decreased scar mobility, Decreased strength  Visit Diagnosis: Pain in left hand  Stiffness of left hand, not elsewhere classified    Problem List Patient Active Problem List   Diagnosis Date Noted  . Disability examination 06/16/2016  . Right sided weakness 12/24/2015  . Dyslipidemia with low high density lipoprotein (HDL) cholesterol with hypertriglyceridemia due to type 2 diabetes mellitus ([facility]) 11/16/2015  . Uncontrolled type 2 diabetes mellitus with hyperglycemia ([facility]) 11/12/2015  . Menopausal symptoms 11/12/2015  . Dupuytren's contracture of both hands 07/05/2013  . Need for Tdap vaccination 07/05/2013  . Arthritis     Carey Bullocks, OTR/L 09/22/2016, 4:57 PM  [facility] [address] [place], [place], [address] Phone: [phone]   Fax:  [phone]  Name: Bailey [name] MRN: [id number] Date of Birth: March 09, 1956

## 2016-09-26 MED FILL — GLIMEPIRIDE 4 MG TABLET: 4 | 30 days supply | Qty: 30 | Fill #1

## 2016-09-26 MED FILL — VIVELLE-DOT 0.025 MG PATCH: 0.025 | 30 days supply | Qty: 8 | Fill #8

## 2016-10-06 ENCOUNTER — Ambulatory Visit: Payer: Medicaid Other | Attending: Orthopedic Surgery | Admitting: Occupational Therapy

## 2016-10-06 DIAGNOSIS — M6281 Muscle weakness (generalized): Secondary | ICD-10-CM | POA: Diagnosis present

## 2016-10-06 DIAGNOSIS — M79642 Pain in left hand: Secondary | ICD-10-CM | POA: Insufficient documentation

## 2016-10-06 DIAGNOSIS — M25642 Stiffness of left hand, not elsewhere classified: Secondary | ICD-10-CM | POA: Diagnosis present

## 2016-10-06 NOTE — Patient Instructions (Signed)
1. Grip Strengthening (Resistive Putty)   Squeeze putty using thumb and all fingers. Repeat _10-20___ times. Do __2__ sessions per day.   2. Roll putty into tube on table and pinch between each finger and thumb x 10 reps each. (can do ring and small finger together)    Wear splint only at night, make sure straps are not too tight, wear edema glove with splint, If you have significant swelling or increased pain, stop wearing splint and call therapist  Copyright  VHI. All rights reserved.

## 2016-10-06 NOTE — Therapy (Signed)
[facility] [address] [place], [place], [address] Phone: [phone]   Fax:  [phone]  Occupational Therapy Treatment  Patient Details  Name: Bailey [name] MRN: [id number] Date of Birth: 1956-05-30 Referring Provider: Dr. Iran Planas  Encounter Date: 10/06/2016      OT End of Session - 10/06/16 1058    Visit Number 3   Number of Visits 4   Authorization Type MCD   Authorization Time Period Approved 3 visits from 09/21/16 - 11/15/16   Authorization - Visit Number 3   Authorization - Number of Visits 4   OT Start Time [id number]   OT Stop Time 0933   OT Time Calculation (min) 46 min      Past Medical History:  Diagnosis Date  . Arthritis    RIGHT shoulder, knees  . Cervical radiculopathy at C5   . Dupuytren's contracture of both hands    L > R  . Dyslipidemia   . History of adenomatous polyp of colon    1997 and 2003    . History of chronic pelvic inflammatory disease   . History of gastric ulcer    2008  . Hypertriglyceridemia   . Right carpal tunnel syndrome   . Type 2 diabetes mellitus, uncontrolled ([facility])    last A1c 8.9 on 07-07-2016  . Vitamin D deficiency     Past Surgical History:  Procedure Laterality Date  . COLONOSCOPY  last one 07-15-2016  . ESOPHAGOGASTRODUODENOSCOPY  last one 2008  . FASCIECTOMY Left 08/24/2016   Procedure: LEFT HAND PALMER AND SMALL FINGER DIGiTAL FASCIECTOMY AND RELEASE;  Surgeon: Iran Planas, MD;  Location: [facility];  Service: Orthopedics;  Laterality: Left;  . LAPAROSCOPIC CHOLECYSTECTOMY  07/17/2000  . ROTATOR CUFF REPAIR Right 2000  . TOTAL ABDOMINAL HYSTERECTOMY W/ BILATERAL SALPINGOOPHORECTOMY  1998    There were no vitals filed for this visit.      Subjective Assessment - 10/06/16 1055    Subjective  Pt reports she has been having a lot of swelling, pt reports MD told her to take off splint   Pertinent History Dupuytren's with  fasciotomy on 08/24/16.  See EPIC snapshot, Bilateral CTS, Rt rotator cuff repair x 2, arthritis   Patient Stated Goals Get my Lt hand better, so I can have surgery to the Rt   Currently in Pain? Yes   Pain Score 4    Pain Location Hand   Pain Orientation Left   Pain Descriptors / Indicators Throbbing   Pain Type Surgical pain   Pain Onset More than a month ago   Pain Frequency Intermittent   Aggravating Factors  movement   Pain Relieving Factors pain meds             ADLs   ADL Comments Pt reports she has been having swelling with splint wear during the day. Pt reports seeing MD and he said should can take splint off.  Pt was instructed to wear splint at nighttime only with edema glove. Therapist  reviewed splint wear with pt and it appears as thought she has been strapping too tightly.     Hand Exercises   Other Hand Exercises Reviewed  A/ROM and P/ROM ex's from HEP.  Pt was instructed in yellow putty HEP, pt returned demonstration.       Modalities   Modalities Ultrasound     Ultrasound   Ultrasound Location Lt palm and base of 5th digit along incision  Ultrasound Parameters 3 Mhz, 20% pulsed, 0.8 wts/cm2 x 8 minutes   Ultrasound Goals --  scar management, desensitization     Splinting   Splinting Minor modification to splint at wrist for increased comfort,positioning and  reviewed proper strapping, (Pt was strapping splint very tightly which may have been causing swelling )Pt instructed to wear splint at nighttime only.                       OT Education - 10/06/16 1059    Education provided Yes   Education Details splint wear at nighttime only, proper application and to remove if increased swelling or pain and to call therapist, yellow putty HEP   Person(s) Educated Patient   Methods Explanation;Demonstration;Verbal cues;Handout   Comprehension Verbalized understanding;Returned demonstration          OT Short Term Goals - 09/22/16 1653       OT SHORT TERM GOAL #1   Title Independent with splint wear and care   Baseline issued, may need adjustments   Time 4   Period Weeks   Status Achieved     OT SHORT TERM GOAL #2   Title Independent with scar massage   Baseline verbally reviewed, but unable to do until stitches removed next week   Status Achieved     OT [place] #3   Title Independent with initial HEP    Baseline issued, may need review/updates   Time 4   Period Weeks   Status Achieved           OT Long Term Goals - 09/01/16 1231      OT LONG TERM GOAL #1   Title Independent with strengthening HEP    Baseline Dependent d/t current precautions   Time 8   Period Weeks   Status New     OT LONG TERM GOAL #2   Title Pt to demo 20 degree or greater improvement in all joints Lt small finger   Baseline MP = 38*, PIP = 50*, DIP = 35*   Time 8   Period Weeks   Status New     OT LONG TERM GOAL #3   Title Pt to return to using Lt hand as assist for all bilateral tasks   Baseline dependent   Time 8   Period Weeks   Status New     OT LONG TERM GOAL #4   Title Grip strength Lt hand to be 25 lbs or greater to assist with opening jars/containers   Baseline unable to assess d/t current precautions   Time 8   Period Weeks   Status New     OT LONG TERM GOAL #5   Title Pain less than or equal to 3/10 with all ADL's   Baseline up to 8/10    Time 8   Period Weeks   Status New               Plan - 10/06/16 1056    Clinical Impression Statement Pt is progressing towards goals for ROM and strength.   Rehab Potential Good   OT Frequency --  3 visits   OT Treatment/Interventions Self-care/ADL training;Moist Heat;Fluidtherapy;DME and/or AE instruction;Splinting;Patient/family education;Compression bandaging;Ultrasound;Therapeutic exercise;Scar mobilization;Therapeutic activities;Cryotherapy;Passive range of motion;Electrical Stimulation;Parrafin;Manual Therapy   Plan Follow protocol, A/ROM, P/ROM  light putty   Consulted and Agree with Plan of Care Patient      Patient will benefit from skilled therapeutic intervention in order to improve the following  deficits and impairments:  Decreased range of motion, Impaired flexibility, Decreased safety awareness, Increased edema, Impaired sensation, Decreased knowledge of precautions, Decreased skin integrity, Impaired UE functional use, Pain, Decreased scar mobility, Decreased strength  Visit Diagnosis: Pain in left hand  Stiffness of left hand, not elsewhere classified  Muscle weakness (generalized)    Problem List Patient Active Problem List   Diagnosis Date Noted  . Disability examination 06/16/2016  . Right sided weakness 12/24/2015  . Dyslipidemia with low high density lipoprotein (HDL) cholesterol with hypertriglyceridemia due to type 2 diabetes mellitus ([facility]) 11/16/2015  . Uncontrolled type 2 diabetes mellitus with hyperglycemia ([facility]) 11/12/2015  . Menopausal symptoms 11/12/2015  . Dupuytren's contracture of both hands 07/05/2013  . Need for Tdap vaccination 07/05/2013  . Arthritis     RINE,KATHRYN 10/06/2016, 11:00 AM Theone Murdoch, OTR/L [phone] Phone: [phone] 11:17 AM 10/12/17[facility] [address] [place], [place], [address] Phone: [phone]   Fax:  [phone]  Name: OLUWATAMILORE MARTELLO MRN: [id number] Date of Birth: 04-30-1956

## 2016-10-20 ENCOUNTER — Ambulatory Visit: Payer: Medicaid Other | Admitting: Occupational Therapy

## 2016-10-20 DIAGNOSIS — M25642 Stiffness of left hand, not elsewhere classified: Secondary | ICD-10-CM

## 2016-10-20 DIAGNOSIS — M79642 Pain in left hand: Secondary | ICD-10-CM

## 2016-10-20 DIAGNOSIS — M6281 Muscle weakness (generalized): Secondary | ICD-10-CM

## 2016-10-20 NOTE — Therapy (Addendum)
[facility] [address] [place], [place], [address] Phone: [phone]   Fax:  [phone]  Occupational Therapy Treatment  Patient Details  Name: Bailey [name] MRN: [id number] Date of Birth: December 10, 1956 Referring Provider: Dr. Iran Planas  Encounter Date: 10/20/2016      OT End of Session - 10/20/16 1043    Visit Number 4   Number of Visits 4   Authorization Type MCD   Authorization Time Period Approved 3 visits from 09/21/16 - 11/15/16   Authorization - Visit Number 4   Authorization - Number of Visits 4   OT Start Time 0849   OT Stop Time 0940   OT Time Calculation (min) 51 min   Activity Tolerance Patient limited by pain   Behavior During Therapy [facility] for tasks assessed/performed      Past Medical History:  Diagnosis Date  . Arthritis    RIGHT shoulder, knees  . Cervical radiculopathy at C5   . Dupuytren's contracture of both hands    L > R  . Dyslipidemia   . History of adenomatous polyp of colon    1997 and 2003    . History of chronic pelvic inflammatory disease   . History of gastric ulcer    2008  . Hypertriglyceridemia   . Right carpal tunnel syndrome   . Type 2 diabetes mellitus, uncontrolled ([place])    last A1c 8.9 on 07-07-2016  . Vitamin D deficiency     Past Surgical History:  Procedure Laterality Date  . COLONOSCOPY  last one 07-15-2016  . ESOPHAGOGASTRODUODENOSCOPY  last one 2008  . FASCIECTOMY Left 08/24/2016   Procedure: LEFT HAND PALMER AND SMALL FINGER DIGiTAL FASCIECTOMY AND RELEASE;  Surgeon: Iran Planas, MD;  Location: [facility];  Service: Orthopedics;  Laterality: Left;  . LAPAROSCOPIC CHOLECYSTECTOMY  07/17/2000  . ROTATOR CUFF REPAIR Right 2000  . TOTAL ABDOMINAL HYSTERECTOMY W/ BILATERAL SALPINGOOPHORECTOMY  1998    There were no vitals filed for this visit.      Subjective Assessment - 10/20/16 1038    Subjective  Pt reports she has been having  a lot of swelling,   Pertinent History Dupuytren's with fasciotomy on 08/24/16.  See EPIC snapshot, Bilateral CTS, Rt rotator cuff repair x 2, arthritis   Patient Stated Goals Get my Lt hand better, so I can have surgery to the Rt   Currently in Pain? Yes   Pain Score 5    Pain Location Hand   Pain Orientation Left   Pain Descriptors / Indicators Throbbing   Pain Type Surgical pain   Pain Onset More than a month ago   Pain Frequency Intermittent   Aggravating Factors  movement   Pain Relieving Factors pain meds          Treatment:        ADLs   ADL Comments   Pt was instructed to wear splint at nighttime only with edema glove. Therapist instructed pt to wear splint only every other night and to remove if it appears to be contributing to swelling. Pt was issued a new edema glove so that she can wear an edema glove during daytime and at night until swelling decreases. Pt was instructed to resume wearing edema glove only at nighttime once swelling improves. Retrograde massage performed to left hand, pt was instructed to perform at home.Pt was also provided with edema contol handouts Pt. reports using an ice pack for edema control at home.  Hand Exercises   Other Hand Exercises Reviewed  A/ROM and P/ROM ex's from HEP.  Pt was instructed to reduce the frequency that she is performing yellow putty exercises to 1-2 x day. Pt was issued red putty to utilize once pain improves and yellow putty becomes easy.      Modalities   Modalities Ultrasound     Ultrasound   Ultrasound Location Lt palm and base of 5th digit along incision   Ultrasound Parameters 3 Mhz, 50% pulsed, 0.8 wts/cm2 x 8 minutes   Ultrasound Goals --  scar management, desensitization     Therapist checked progress towards goals. Pt was instructed that if her swelling continues she should call her MD.                 OT Education - 10/20/16 1207    Education provided Yes   Education Details splint  wear, schedule,( stop wearing if it increases swelling), edema control, retrograde  massage, A/ROM, P/ROM for hand , wrist A/ROM and when to progress to red putty   Person(s) Educated Patient   Methods Explanation;Demonstration;Verbal cues;Handout   Comprehension Verbalized understanding;Returned demonstration;Verbal cues required          OT Short Term Goals - 09/22/16 1653      OT SHORT TERM GOAL #1   Title Independent with splint wear and care   Baseline issued, may need adjustments   Time 4   Period Weeks   Status Achieved     OT SHORT TERM GOAL #2   Title Independent with scar massage   Baseline verbally reviewed, but unable to do until stitches removed next week   Status Achieved     OT [place] #3   Title Independent with initial HEP    Baseline issued, may need review/updates   Time 4   Period Weeks   Status Achieved           OT Long Term Goals - 10/20/16 8242      OT LONG TERM GOAL #1   Title Independent with strengthening HEP    Time 8   Period Weeks   Status Achieved     OT LONG TERM GOAL #2   Title Pt to demo 20 degree or greater improvement in all joints Lt small finger   Time 8   Period Weeks   Status Partially Met  MP 55, PIP 75, DIP 50, met for PIP, not for other joints due to swelling     OT LONG TERM GOAL #3   Title Pt to return to using Lt hand as assist for all bilateral tasks   Time 8   Period Weeks   Status Not Met  not met not consistent     OT LONG TERM GOAL #4   Title Grip strength Lt hand to be 25 lbs or greater to assist with opening jars/containers   Time 8   Period Weeks   Status Not Met  5 lbs, limited by pain and edema today     OT LONG TERM GOAL #5   Title Pain less than or equal to 3/10 with all ADL's   Time 8   Period Weeks   Status Not Met  pain 4-5/10               Plan - 10/20/16 1039    Clinical Impression Statement Pt's overall progress has been limited by swelling. New edema glove was  issued and pt was instructed to wear  edema glove both during the daytime and at night. Pt agrees with plans for d/c today due to medicaid restrictions.   Rehab Potential Fair   OT Frequency --  3 visits   OT Duration 8 weeks   Plan discharge OT   Consulted and Agree with Plan of Care Patient      Patient will benefit from skilled therapeutic intervention in order to improve the following deficits and impairments:  Decreased range of motion, Impaired flexibility, Decreased safety awareness, Increased edema, Impaired sensation, Decreased knowledge of precautions, Decreased skin integrity, Impaired UE functional use, Pain, Decreased scar mobility, Decreased strength  Visit Diagnosis: Pain in left hand  Stiffness of left hand, not elsewhere classified  Muscle weakness (generalized)   OCCUPATIONAL THERAPY DISCHARGE SUMMARY Current functional level related to goals / functional outcomes: Pt did not fully meet goals due to continued pain and swelling in left hand. Pt has been instructed in edema control techinques and use of edema glove.    Remaining deficits: Pain, edema, decreased ROM, decreased strength   Education / Equipment: Pt was instructed in the following: splint wear, care and precautions, edema control, retrograde massage, ROM and strengthening HEP. Pt was instructed that if swelling persists she should contact her MD. Pt verbalized understanding of all recommendations/ education.  Plan: Patient agrees to discharge.  Patient goals were partially met. Patient is being discharged due to financial reasons.  ?????      Problem List Patient Active Problem List   Diagnosis Date Noted  . Disability examination 06/16/2016  . Right sided weakness 12/24/2015  . Dyslipidemia with low high density lipoprotein (HDL) cholesterol with hypertriglyceridemia due to type 2 diabetes mellitus ([place]) 11/16/2015  . Uncontrolled type 2 diabetes mellitus with hyperglycemia ([place]) 11/12/2015  .  Menopausal symptoms 11/12/2015  . Dupuytren's contracture of both hands 07/05/2013  . Need for Tdap vaccination 07/05/2013  . Arthritis     Bernell Sigal 10/20/2016, 12:09 PM Theone Murdoch, OTR/L [phone] Phone: [phone] 12:09 PM 10/20/16 [facility] [address] [place], [place], [address] Phone: [phone]   Fax:  [phone]  Name: ARISBETH PURRINGTON MRN: [id number] Date of Birth: 1956-11-21

## 2016-10-20 NOTE — Patient Instructions (Signed)
Wear edema glove both during day and nightime while hand is swollen, remove for bathing or if sweating, if swelling improves, resume wearing at night only Continue exercises, progress to red putty when pain improves and yellow putty becomes easy.  Do not over exercise with putty, perform 1-2 x day  Try wearing splint only every other night, for the next 2 weeks, then consider stopping wearing,  if splint contributes to swelling, stop wearing it.  Gently exercise wrist in flexion and extension  10 reps 3x day

## 2016-10-21 ENCOUNTER — Encounter: Payer: Self-pay | Admitting: Family Medicine

## 2016-10-21 ENCOUNTER — Ambulatory Visit (INDEPENDENT_AMBULATORY_CARE_PROVIDER_SITE_OTHER): Payer: Medicaid Other | Admitting: Neurology

## 2016-10-21 ENCOUNTER — Ambulatory Visit: Payer: Medicaid Other | Attending: Family Medicine | Admitting: Family Medicine

## 2016-10-21 ENCOUNTER — Encounter: Payer: Self-pay | Admitting: Neurology

## 2016-10-21 VITALS — BP 110/80 | HR 72 | Ht 62.0 in | Wt 152.1 lb

## 2016-10-21 VITALS — BP 130/72 | HR 86 | Temp 98.9°F | Ht 62.0 in | Wt 153.4 lb

## 2016-10-21 DIAGNOSIS — E1165 Type 2 diabetes mellitus with hyperglycemia: Secondary | ICD-10-CM | POA: Insufficient documentation

## 2016-10-21 DIAGNOSIS — Z79899 Other long term (current) drug therapy: Secondary | ICD-10-CM | POA: Diagnosis not present

## 2016-10-21 DIAGNOSIS — G5601 Carpal tunnel syndrome, right upper limb: Secondary | ICD-10-CM

## 2016-10-21 DIAGNOSIS — M7989 Other specified soft tissue disorders: Secondary | ICD-10-CM | POA: Diagnosis not present

## 2016-10-21 DIAGNOSIS — E782 Mixed hyperlipidemia: Secondary | ICD-10-CM

## 2016-10-21 DIAGNOSIS — M5412 Radiculopathy, cervical region: Secondary | ICD-10-CM

## 2016-10-21 DIAGNOSIS — E786 Lipoprotein deficiency: Secondary | ICD-10-CM

## 2016-10-21 DIAGNOSIS — M720 Palmar fascial fibromatosis [Dupuytren]: Secondary | ICD-10-CM

## 2016-10-21 DIAGNOSIS — Z9889 Other specified postprocedural states: Secondary | ICD-10-CM | POA: Insufficient documentation

## 2016-10-21 DIAGNOSIS — E559 Vitamin D deficiency, unspecified: Secondary | ICD-10-CM | POA: Diagnosis not present

## 2016-10-21 DIAGNOSIS — E1169 Type 2 diabetes mellitus with other specified complication: Secondary | ICD-10-CM | POA: Diagnosis not present

## 2016-10-21 DIAGNOSIS — E781 Pure hyperglyceridemia: Secondary | ICD-10-CM

## 2016-10-21 DIAGNOSIS — F17210 Nicotine dependence, cigarettes, uncomplicated: Secondary | ICD-10-CM | POA: Insufficient documentation

## 2016-10-21 LAB — GLUCOSE, POCT (MANUAL RESULT ENTRY): POC Glucose: 232 mg/dl — AB (ref 70–99)

## 2016-10-21 LAB — POCT GLYCOSYLATED HEMOGLOBIN (HGB A1C): Hemoglobin A1C: 8

## 2016-10-21 MED ORDER — VITAMIN D3 50 MCG (2000 UT) PO TABS: 2000.0000 [IU] | ORAL_TABLET | Freq: Every day | ORAL | 11 refills | Status: DC

## 2016-10-21 MED ORDER — ATORVASTATIN CALCIUM 40 MG PO TABS: 40.0000 mg | ORAL_TABLET | Freq: Every day | ORAL | 11 refills | Status: DC

## 2016-10-21 MED FILL — ATORVASTATIN 40 MG TABLET: 40 | 30 days supply | Qty: 30 | Fill #0

## 2016-10-21 NOTE — Assessment & Plan Note (Signed)
A; s/p L hand  palmer and 5th finger digital fasciectomy and release done on 08/24/2016 with swelling in hand P: Rx for compression glove written

## 2016-10-21 NOTE — Assessment & Plan Note (Signed)
A: improved control Med: compliant P: Continue current regimen

## 2016-10-21 NOTE — Progress Notes (Signed)
Follow-up Visit   Date: 10/21/16    BRIANNAH LONA MRN: [id number] DOB: [birth year]   Interim History: Bailey [name] is a 60 y.o. right-handed African American female with diabetes mellitus, hyperlipidemia, tobacco abuse, and arthritis returning to the clinic for follow-up of episodic right side weakness.  The patient was accompanied to the clinic by self.  History of present illness: Per review of Epic, she has reported right sided weakness in the summer of 2016.  She presented to the ED in August for sensation of right foot weakness and drop where MRI brain showed moderate chronic microvascular changes. In late 2016, she reports having a fall while climbing three steps to go to her porch and her right leg buckled on her.  She was able to stand up unassisted.  Since then, she continues to have intermittent right leg buckling and has not sustained any falls.  She has weakness of the right leg and associated pain which involves her thigh and hip.  Weakness is intermittent and triggered by standing.  Pain is alleviated if she flexes her knee.    She has right hand numbness/tingling and weakness.  She is unable to lift heavy objects with her right hand.  She endorses neck and back pain.    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  UPDATE 10/21/2016:  She recently had surgery for Dupuytren's contracture on the left hand in August.  She complains of left wrist weakness, swelling, and pain after her surgery and completed hand PT.  She is unable to grip tightly with the left hand because of the swelling.  She does not have knee buckling as often as before and has not sustained any falls.  She has made a lot of diet and vitamin changes and feels that it helps.  She occasionally has tingling of the right hand at night time and has mild CTS on EMG. She is not using a wrist splint.  She was found to have vitamin D deficiency and completed 50,000 units weekly, but  has not started daily supplements.   She is currently not working because of her left hand weakness.   Medications:  Current Outpatient Prescriptions on File Prior to Visit  Medication Sig Dispense Refill  . ACCU-CHEK SOFTCLIX LANCETS lancets Use as instructed 100 each 12  . atorvastatin (LIPITOR) 40 MG tablet Take 1 tablet (40 mg total) by mouth daily. (Patient taking differently: Take 40 mg by mouth every morning. ) 30 tablet 11  . Blood Glucose Monitoring Suppl (ACCU-CHEK AVIVA PLUS) w/Device KIT Use as directed 1 kit 0  . carbamide peroxide (DEBROX) 6.5 % otic solution Place 5 drops into the left ear 2 (two) times daily. For 5 days then return for ear irrigation 15 mL 0  . estradiol (VIVELLE-DOT) 0.025 MG/24HR Place 1 patch onto the skin 2 (two) times a week. 8 patch 12  . glimepiride (AMARYL) 4 MG tablet Take 1 tablet (4 mg total) by mouth daily before breakfast. 30 tablet 11  . glucose blood (ACCU-CHEK AVIVA PLUS) test strip Use as instructed 100 each 12  . Lancet Devices (ACCU-CHEK SOFTCLIX) lancets Use as instructed 1 each 0  . sitaGLIPtin (JANUVIA) 100 MG tablet Take 1 tablet (100 mg total) by mouth daily. (Patient taking differently: Take 100 mg by mouth every morning. ) 30 tablet 11  . docusate sodium (COLACE) 100 MG capsule Take 1 capsule (100 mg total) by mouth 2 (two) times  daily. (Patient not taking: Reported on 10/21/2016) 30 capsule 0  . Hydrocodone-Acetaminophen (VICODIN) 5-300 MG TABS Take 1 tablet by mouth 2 (two) times daily as needed (PAIN). (Patient not taking: Reported on 10/21/2016) 25 each 0  . Multiple Vitamins-Minerals (MULTIVITAMIN WITH MINERALS) tablet Take 1 tablet by mouth every other day.     . ondansetron (ZOFRAN) 4 MG tablet Take 1 tablet (4 mg total) by mouth every 8 (eight) hours as needed for nausea or vomiting. (Patient not taking: Reported on 10/21/2016) 20 tablet 0  . oxyCODONE-acetaminophen (PERCOCET/ROXICET) 5-325 MG tablet Take by mouth 3 (three) times  daily.    Vladimir Faster Glycol-Propyl Glycol (SYSTANE) 0.4-0.3 % SOLN Apply 2 drops to eye as needed. (Patient not taking: Reported on 10/21/2016) 5 mL 5  . traMADol (ULTRAM) 50 MG tablet Take 1 tablet (50 mg total) by mouth daily as needed. (Patient not taking: Reported on 10/21/2016) 30 tablet 0  . Vitamin D, Ergocalciferol, (DRISDOL) 50000 units CAPS capsule Take 1 capsule (50,000 Units total) by mouth every 7 (seven) days. (Patient not taking: Reported on 10/21/2016) 4 capsule 0  . [DISCONTINUED] albuterol (PROVENTIL HFA;VENTOLIN HFA) 108 (90 BASE) MCG/ACT inhaler Inhale 2 puffs into the lungs every 4 (four) hours as needed for wheezing (cough). (Patient not taking: Reported on 08/22/2015) 1 Inhaler 0   No current facility-administered medications on file prior to visit.     Allergies:  Allergies  Allergen Reactions  . Metformin And Related Diarrhea  . Shellfish Allergy Anaphylaxis, Hives and Swelling    Lobster, crab (shrimp's ok)  . Ivp Dye [Iodinated Diagnostic Agents] Hives  . Adhesive [Tape] Other (See Comments)    blisters  . Codeine Other (See Comments)    Unknown childhood allergy  . Hydrocodone-Acetaminophen   . Latex Other (See Comments)    blisters  . Penicillins Other (See Comments)    Unknown childhood reaction    Review of Systems:  CONSTITUTIONAL: No fevers, chills, night sweats, or weight loss.  EYES: No visual changes or eye pain ENT: No hearing changes.  No history of nose bleeds.   RESPIRATORY: No cough, wheezing and shortness of breath.   CARDIOVASCULAR: Negative for chest pain, and palpitations.   GI: Negative for abdominal discomfort, blood in stools or black stools.  No recent change in bowel habits.   GU:  No history of incontinence.   MUSCLOSKELETAL: No history of joint pain or swelling.  No myalgias.   SKIN: Negative for lesions, rash, and itching.   ENDOCRINE: Negative for cold or heat intolerance, polydipsia or goiter.   PSYCH:  No depression or  anxiety symptoms.   NEURO: As Above.   Vital Signs:  BP 110/80   Pulse 72   Ht '5\' 2"'  (1.575 m)   Wt 152 lb 2 oz (69 kg)   SpO2 98%   BMI 27.82 kg/m  Neurological Exam: MENTAL STATUS including orientation to time, place, person, recent and remote memory, attention span and concentration, language, and fund of knowledge is normal.  Speech is not dysarthric.  CRANIAL NERVES: Pupils equal round and reactive to light.  Normal conjugate, extra-ocular eye movements in all directions of gaze.  No ptosis.  Face is symmetric. Palate elevates symmetrically.  Tongue is midline.  MOTOR:  Motor strength is 5/5 in all extremities, except left hand where there is reduced ROM and weakness 4/5 (?pain-limiting) with wrist extenion/flexion, finger abduction, and finger flexion.  Left hand is swollen and tender over the dorsum of the  wrist.  She has scar of her incision with mild keloid-like appearance.     MSRs:  Reflexes are 2+/4 throughout  SENSORY:  Intact to vibration throughout.  COORDINATION/GAIT:  Gait narrow based and stable.   Data: MRI brain 08/22/2015: Negative for acute abnormality. Mild to moderate chronic microvascular ischemic change in the white matter.  CT cervical spine wo contrast 03/14/2009:  Negative for fracture, subluxation or static signs of instability. Labs 06/10/2016:  HbA1c 9.5*, HIV neg Labs 07/01/2016: folate 11, vitamin D 18.9*, TDH 3.20  NCS/EMG of the right side 07/05/2016: 1. Right median neuropathy at or distal to the wrist, consistent with a diagnosis of carpal tunnel syndrome. Overall, these findings are mild in degree electrically. 2. Chronic C5 radiculopathy affecting the right upper extremity; very mild in degree electrically. 3. There is no evidence of a generalized sensorimotor polyneuropathy or lumbosacral radiculopathy affecting the right side.  IMPRESSION/PLAN: 1. Episodic right sided weakness - improved.  MRI brain was personally reviewed which shows chronic  white matter changes throughout, including left parietal area which is slightly larger.  There are no upper motor neuron findings to suggest this area could be symptomatic.  EDX shows mild right carpal tunnel syndrome and very mild right C5 radiculopathy.  EDX of the leg was normal.  She is clinically doing well from a neurological stand point.  If she develops new weakness, it would be reasonable to re-image the brain with contrast to exclude demyelinating disease.   2. Vitamin D deficiency, completed 8 week course of 50,000U weekly.  Start vitamin D 2000U daily.  3. Mild CTS, start using wrist splint at night time  4. Left hand Duptreyen's contracture s/p surgery, follow-up with Dr. Caralyn Guile for ongoing care  Return to clinic as needed    The duration of this appointment visit was 30 minutes of face-to-face time with the patient.  Greater than 50% of this time was spent in counseling, explanation of diagnosis, planning of further management, and coordination of care.   Thank you for allowing me to participate in patient's care.  If I can answer any additional questions, I would be pleased to do so.    Sincerely,    Renlee Floor K. Posey Pronto, DO

## 2016-10-21 NOTE — Patient Instructions (Addendum)
Start vitamin D 2000 units  You have mild carpal tunnel syndrome on the right hand.  You can start using a wrist splint on the right and minimize wrist flexion  Return to clinic as needed

## 2016-10-21 NOTE — Progress Notes (Signed)
Pt is here to follow up on diabetes

## 2016-10-21 NOTE — Progress Notes (Signed)
Subjective:  Patient ID: Bailey [name], female    DOB: May 06, 1956  Age: 60 y.o. MRN: [id number]  CC: Diabetes   HPI Bailey [name] has diabetes she presents for    1. CHRONIC DIABETES  Disease Monitoring  Blood Sugar Ranges:   Fasting: 112-115  Post prandial: < 150   Polyuria: no   Visual problems: no   Medication Compliance: yes  Medication Side Effects  Hypoglycemia: no   2. L hand swelling: following palmer and 5th finger digital fasciectomy and release done on 08/24/2016. Patient has completed 3 PT sessions. She saw her neurologist today. She has f/u with her surgeon Dr. Caralyn Guile on 11/03/2106. She is elevating to reduce hand swelling. She is taking tylenol for pain.   Past Surgical History:  Procedure Laterality Date  . COLONOSCOPY  last one 07-15-2016  . ESOPHAGOGASTRODUODENOSCOPY  last one 2008  . FASCIECTOMY Left 08/24/2016   Procedure: LEFT HAND PALMER AND SMALL FINGER DIGiTAL FASCIECTOMY AND RELEASE;  Surgeon: Iran Planas, MD;  Location: [facility];  Service: Orthopedics;  Laterality: Left;  . LAPAROSCOPIC CHOLECYSTECTOMY  07/17/2000  . ROTATOR CUFF REPAIR Right 2000  . TOTAL ABDOMINAL HYSTERECTOMY W/ BILATERAL SALPINGOOPHORECTOMY  1998   Social History  Substance Use Topics  . Smoking status: Current Every Day Smoker    Packs/day: 0.50    Years: 28.00  . Smokeless tobacco: Never Used  . Alcohol use No    Outpatient Medications Prior to Visit  Medication Sig Dispense Refill  . ACCU-CHEK SOFTCLIX LANCETS lancets Use as instructed 100 each 12  . atorvastatin (LIPITOR) 40 MG tablet Take 1 tablet (40 mg total) by mouth daily. (Patient taking differently: Take 40 mg by mouth every morning. ) 30 tablet 11  . Blood Glucose Monitoring Suppl (ACCU-CHEK AVIVA PLUS) w/Device KIT Use as directed 1 kit 0  . carbamide peroxide (DEBROX) 6.5 % otic solution Place 5 drops into the left ear 2 (two) times daily. For 5 days then return for ear irrigation  15 mL 0  . docusate sodium (COLACE) 100 MG capsule Take 1 capsule (100 mg total) by mouth 2 (two) times daily. (Patient not taking: Reported on 10/21/2016) 30 capsule 0  . estradiol (VIVELLE-DOT) 0.025 MG/24HR Place 1 patch onto the skin 2 (two) times a week. 8 patch 12  . glimepiride (AMARYL) 4 MG tablet Take 1 tablet (4 mg total) by mouth daily before breakfast. 30 tablet 11  . glucose blood (ACCU-CHEK AVIVA PLUS) test strip Use as instructed 100 each 12  . Hydrocodone-Acetaminophen (VICODIN) 5-300 MG TABS Take 1 tablet by mouth 2 (two) times daily as needed (PAIN). (Patient not taking: Reported on 10/21/2016) 25 each 0  . Lancet Devices (ACCU-CHEK SOFTCLIX) lancets Use as instructed 1 each 0  . Multiple Vitamins-Minerals (MULTIVITAMIN WITH MINERALS) tablet Take 1 tablet by mouth every other day.     . ondansetron (ZOFRAN) 4 MG tablet Take 1 tablet (4 mg total) by mouth every 8 (eight) hours as needed for nausea or vomiting. (Patient not taking: Reported on 10/21/2016) 20 tablet 0  . oxyCODONE-acetaminophen (PERCOCET/ROXICET) 5-325 MG tablet Take by mouth 3 (three) times daily.    Vladimir Faster Glycol-Propyl Glycol (SYSTANE) 0.4-0.3 % SOLN Apply 2 drops to eye as needed. (Patient not taking: Reported on 10/21/2016) 5 mL 5  . sitaGLIPtin (JANUVIA) 100 MG tablet Take 1 tablet (100 mg total) by mouth daily. (Patient taking differently: Take 100 mg by mouth every morning. ) 30 tablet  11  . traMADol (ULTRAM) 50 MG tablet Take 1 tablet (50 mg total) by mouth daily as needed. (Patient not taking: Reported on 10/21/2016) 30 tablet 0  . Vitamin D, Ergocalciferol, (DRISDOL) 50000 units CAPS capsule Take 1 capsule (50,000 Units total) by mouth every 7 (seven) days. (Patient not taking: Reported on 10/21/2016) 4 capsule 0   No facility-administered medications prior to visit.     ROS Review of Systems  Constitutional: Negative for chills and fever.  HENT: negative for headache.    Eyes: Negative for  photophobia, discharge, redness, itching and visual disturbance.  Respiratory: Negative for shortness of breath.   Cardiovascular: Negative for chest pain.  Gastrointestinal: Negative for abdominal pain, blood in stool and diarrhea.  Musculoskeletal: Negative for arthralgias and back pain. positive for L hand swelling to L ulnar wrist.  Skin: Negative for rash.  Allergic/Immunologic: Negative for immunocompromised state.  Hematological: Negative for adenopathy. Does not bruise/bleed easily.  Psychiatric/Behavioral: Negative for dysphoric mood and suicidal ideas.    Objective:  BP 130/72 (BP Location: Right Arm, Patient Position: Sitting, Cuff Size: Small)   Pulse 86   Temp 98.9 F (37.2 C) (Oral)   Ht '5\' 2"'  (1.575 m)   Wt 153 lb 6.4 oz (69.6 kg)   SpO2 99%   BMI 28.06 kg/m   BP/Weight 10/21/2016 10/21/2016 2/45/8099  Systolic BP 833 825 053  Diastolic BP 72 80 72  Wt. (Lbs) 153.4 152.13 149  BMI 28.06 27.82 27.25   Physical Exam  Constitutional: She is oriented to person, place, and time. She appears well-developed and well-nourished. No distress.  HENT:  Head: Normocephalic and atraumatic.   Pulmonary/Chest: Effort normal.  Musculoskeletal: She exhibits no edema.       Hands: healed surgical scar L palm along 5th finger and palm swelling in L lateral hand. Decreased ROM in L hand. No erythema or discharge.  Neurological: She is alert and oriented to person, place, and time.  Skin: Skin is warm and dry. No rash noted.  Psychiatric: She has a normal mood and affect.   Lab Results  Component Value Date   HGBA1C 8.9 07/07/2016   Lab Results  Component Value Date   HGBA1C 8.0 10/21/2016   CBG 232 (just ate lunch < 1 hrs ago)    Assessment & Plan:  Bailey [name] was seen today for diabetes.  Diagnoses and all orders for this visit:  Uncontrolled type 2 diabetes mellitus with hyperglycemia, without long-term current use of insulin (HCC) -     Glucose (CBG) -     HgB  A1c  Swelling of left hand  Other orders -     Cholecalciferol (VITAMIN D3) 2000 units TABS; Take 2,000 Units by mouth daily.   There are no diagnoses linked to this encounter.  No orders of the defined types were placed in this encounter.   Follow-up: No Follow-up on file.   Boykin Nearing MD

## 2016-10-21 NOTE — Patient Instructions (Addendum)
Bailey [name] was seen today for diabetes.  Diagnoses and all orders for this visit:  Uncontrolled type 2 diabetes mellitus with hyperglycemia, without long-term current use of insulin (HCC) -     Glucose (CBG) -     HgB A1c  Swelling of left hand  Dupuytren's contracture of both hands  Dyslipidemia with low high density lipoprotein (HDL) cholesterol with hypertriglyceridemia due to type 2 diabetes mellitus (HCC) -     atorvastatin (LIPITOR) 40 MG tablet; Take 1 tablet (40 mg total) by mouth daily.  Other orders -     Cholecalciferol (VITAMIN D3) 2000 units TABS; Take 2,000 Units by mouth daily.    Rx for compression glove to be worn on L hand  F/u in 3 months for diabetes   Dr. Adrian Blackwater

## 2016-10-28 ENCOUNTER — Emergency Department (HOSPITAL_COMMUNITY)
Admission: EM | Admit: 2016-10-28 | Discharge: 2016-10-28 | Disposition: A | Discharge status: Home / Self Care | Payer: Medicaid Other | Attending: Emergency Medicine | Admitting: Emergency Medicine

## 2016-10-28 ENCOUNTER — Encounter (HOSPITAL_COMMUNITY): Payer: Self-pay | Admitting: Emergency Medicine

## 2016-10-28 DIAGNOSIS — E119 Type 2 diabetes mellitus without complications: Secondary | ICD-10-CM | POA: Insufficient documentation

## 2016-10-28 DIAGNOSIS — F17200 Nicotine dependence, unspecified, uncomplicated: Secondary | ICD-10-CM | POA: Diagnosis not present

## 2016-10-28 DIAGNOSIS — Z7984 Long term (current) use of oral hypoglycemic drugs: Secondary | ICD-10-CM | POA: Diagnosis not present

## 2016-10-28 DIAGNOSIS — M79641 Pain in right hand: Secondary | ICD-10-CM | POA: Insufficient documentation

## 2016-10-28 DIAGNOSIS — Z91040 Latex allergy status: Secondary | ICD-10-CM | POA: Diagnosis not present

## 2016-10-28 DIAGNOSIS — G8918 Other acute postprocedural pain: Secondary | ICD-10-CM | POA: Diagnosis not present

## 2016-10-28 DIAGNOSIS — M7989 Other specified soft tissue disorders: Secondary | ICD-10-CM

## 2016-10-28 DIAGNOSIS — M720 Palmar fascial fibromatosis [Dupuytren]: Secondary | ICD-10-CM | POA: Diagnosis not present

## 2016-10-28 MED ORDER — CEPHALEXIN 500 MG PO CAPS: 1000.0000 mg | ORAL_CAPSULE | Freq: Two times a day (BID) | ORAL | 0 refills | Status: DC

## 2016-10-28 NOTE — ED Notes (Addendum)
Pt assessed and discharged by Dr. Johnney Killian

## 2016-10-28 NOTE — ED Triage Notes (Signed)
Per pt, states she had surgery on her right hand 2 weeks ago-having increased swelling-appointment with surgeon scheduled for next week

## 2016-10-28 NOTE — ED Provider Notes (Signed)
[facility] Provider Note   CSN: [id number] Arrival date & time: 10/28/16  1008     History   Chief Complaint Chief Complaint  Patient presents with  . hand swollen    HPI Bailey [name] is a 60 y.o. female.  HPI And has surgery for Dupuytren's contracture in August. This was a congenital etiology. Patient reports that since that time she's having problems intermittently with swelling and redness. She is undergoing physical therapy and wearing a hand sleeve. She states for 2 weeks now the swelling and redness has been increased. She denies is severely painful. She reports however she can't go on like this with the swelling and discomfort. She wants to get better. Past Medical History:  Diagnosis Date  . Arthritis    RIGHT shoulder, knees  . Cervical radiculopathy at C5   . Dupuytren's contracture of both hands    L > R  . Dyslipidemia   . History of adenomatous polyp of colon    1997 and 2003    . History of chronic pelvic inflammatory disease   . History of gastric ulcer    2008  . Hypertriglyceridemia   . Right carpal tunnel syndrome   . Type 2 diabetes mellitus, uncontrolled ([place])    last A1c 8.9 on 07-07-2016  . Vitamin D deficiency     Patient Active Problem List   Diagnosis Date Noted  . Disability examination 06/16/2016  . Right sided weakness 12/24/2015  . Dyslipidemia with low high density lipoprotein (HDL) cholesterol with hypertriglyceridemia due to type 2 diabetes mellitus ([place]) 11/16/2015  . Uncontrolled type 2 diabetes mellitus with hyperglycemia ([place]) 11/12/2015  . Menopausal symptoms 11/12/2015  . Dupuytren's contracture of both hands 07/05/2013  . Need for Tdap vaccination 07/05/2013  . Arthritis     Past Surgical History:  Procedure Laterality Date  . COLONOSCOPY  last one 07-15-2016  . ESOPHAGOGASTRODUODENOSCOPY  last one 2008  . FASCIECTOMY Left 08/24/2016   Procedure: LEFT HAND PALMER AND SMALL FINGER DIGiTAL FASCIECTOMY AND  RELEASE;  Surgeon: Iran Planas, MD;  Location: [facility];  Service: Orthopedics;  Laterality: Left;  . LAPAROSCOPIC CHOLECYSTECTOMY  07/17/2000  . ROTATOR CUFF REPAIR Right 2000  . TOTAL ABDOMINAL HYSTERECTOMY W/ BILATERAL SALPINGOOPHORECTOMY  1998    OB History    No data available       Home Medications    Prior to Admission medications   Medication Sig Start Date End Date Taking? Authorizing Provider  ACCU-CHEK SOFTCLIX LANCETS lancets Use as instructed 06/15/16   Boykin Nearing, MD  atorvastatin (LIPITOR) 40 MG tablet Take 1 tablet (40 mg total) by mouth daily. 10/21/16   Josalyn Funches, MD  Blood Glucose Monitoring Suppl (ACCU-CHEK AVIVA PLUS) w/Device KIT Use as directed 06/15/16   Boykin Nearing, MD  carbamide peroxide (DEBROX) 6.5 % otic solution Place 5 drops into the left ear 2 (two) times daily. For 5 days then return for ear irrigation 08/08/16   Boykin Nearing, MD  cephALEXin (KEFLEX) 500 MG capsule Take 2 capsules (1,000 mg total) by mouth 2 (two) times daily. 10/28/16   Charlesetta Shanks, MD  Cholecalciferol (VITAMIN D3) 2000 units TABS Take 2,000 Units by mouth daily. 10/21/16   Josalyn Funches, MD  estradiol (VIVELLE-DOT) 0.025 MG/24HR Place 1 patch onto the skin 2 (two) times a week. 12/10/15   Woodroe Mode, MD  glimepiride (AMARYL) 4 MG tablet Take 1 tablet (4 mg total) by mouth daily before breakfast. 08/08/16  Boykin Nearing, MD  glucose blood (ACCU-CHEK AVIVA PLUS) test strip Use as instructed 06/15/16   Boykin Nearing, MD  Lancet Devices (ACCU-CHEK [facility]) lancets Use as instructed 06/15/16   Boykin Nearing, MD  Multiple Vitamins-Minerals (MULTIVITAMIN WITH MINERALS) tablet Take 1 tablet by mouth every other day.     Historical Provider, MD  Polyethyl Glycol-Propyl Glycol (SYSTANE) 0.4-0.3 % SOLN Apply 2 drops to eye as needed. Patient not taking: Reported on 10/21/2016 08/08/16   Boykin Nearing, MD  sitaGLIPtin (JANUVIA) 100 MG tablet Take 1  tablet (100 mg total) by mouth daily. Patient taking differently: Take 100 mg by mouth every morning.  08/08/16   Boykin Nearing, MD    Family History Family History  Problem Relation Age of Onset  . Diabetes Mother   . Hypertension Mother   . Dementia Mother   . Pancreatitis Mother   . Asthma Mother   . COPD Mother   . Stroke Mother   . Hyperlipidemia Mother   . Heart disease Mother   . Hypertension Father   . Hypertension Sister   . Diabetes Sister   . Hyperlipidemia Sister   . Hypertension Brother   . Diabetes Brother   . Drug abuse Brother   . Cancer Brother     colon  . Hyperlipidemia Brother   . Hyperlipidemia Brother   . Hypertension Brother   . Hyperlipidemia Sister   . Hypertension Sister   . Diabetes Sister   . Diabetes Maternal Grandmother     Social History Social History  Substance Use Topics  . Smoking status: Current Every Day Smoker    Packs/day: 0.50    Years: 28.00  . Smokeless tobacco: Never Used  . Alcohol use No     Allergies   Metformin and related; Shellfish allergy; Ivp dye [iodinated diagnostic agents]; Adhesive [tape]; Codeine; Hydrocodone-acetaminophen; Latex; and Penicillins   Review of Systems Review of Systems Constitutional: No fever no chills no malaise   restaurant: No chest pain or shortness of breath  Physical Exam Updated Vital Signs BP (!) 138/52 (BP Location: Right Arm)   Pulse 77   Temp 98.1 F (36.7 C) (Oral)   Resp 12   SpO2 100%   Physical Exam  Constitutional: She is oriented to person, place, and time. She appears well-developed and well-nourished. No distress.  HENT:  Head: Normocephalic and atraumatic.  Eyes: EOM are normal.  Pulmonary/Chest: Effort normal.  Musculoskeletal:  Right hand has well healed scars. Moderate residual contracture of 4/5 digits. Mild diffuse swelling and mild redness .   Neurological: She is alert and oriented to person, place, and time.  Skin: Skin is warm and dry.      ED Treatments / Results  Labs (all labs ordered are listed, but only abnormal results are displayed) Labs Reviewed - No data to display  EKG  EKG Interpretation None       Radiology No results found.  Procedures Procedures (including critical care time)  Medications Ordered in ED Medications - No data to display   Initial Impression / Assessment and Plan / ED Course  I have reviewed the triage vital signs and the nursing notes.  Pertinent labs & imaging results that were available during my care of the patient were reviewed by me and considered in my medical decision making (see chart for details).  Clinical Course     Final Clinical Impressions(s) / ED Diagnoses   Final diagnoses:  Post-operative pain  Dupuytren contracture  Swelling of  right hand  patient is postoperative from August. She is frustrated because she continues to have pain, swelling and range of motion limitations. There is mild erythema present. There does not appear to be localizing to suggest that the wound specifically is infected. Considerations for some possible diffuse cellulitis versus chronic inflammatory changes. Patient will be placed on Keflex and has close follow-up with her hand surgeon.   New Prescriptions Discharge Medication List as of 10/28/2016 11:01 AM    START taking these medications   Details  cephALEXin (KEFLEX) 500 MG capsule Take 2 capsules (1,000 mg total) by mouth 2 (two) times daily., Starting Fri 10/28/2016, Print         Charlesetta Shanks, MD 11/07/16 [phone]

## 2016-11-03 MED FILL — GLIMEPIRIDE 4 MG TABLET: 4 | 30 days supply | Qty: 30 | Fill #2

## 2016-11-16 ENCOUNTER — Telehealth: Payer: Self-pay | Admitting: Family Medicine

## 2016-11-16 NOTE — Telephone Encounter (Signed)
Pt states she needs letter from PCP stating that she needed dental procedure for medical necessity.  She will be sending letter to [facility].  Please f/u with pt.

## 2016-11-24 ENCOUNTER — Telehealth: Payer: Self-pay | Admitting: Family Medicine

## 2016-11-24 NOTE — Telephone Encounter (Signed)
Will route to PCP 

## 2016-11-24 NOTE — Telephone Encounter (Signed)
Patient came to the office to follow up on the call that she made last week. I informed patient that PCP just got back from the holidays. Pt informed me that because of the pain and loose tooth, she had to see a dentist right away without being referred. Medicaid is requesting a letter from PCP stating that patient had to see the dentist for this in order to cover expenses. Pt will come by again with follow up notes from dentist for procedures and letter from [facility]. Please follow up.  Thank you.

## 2016-11-28 MED FILL — GLIMEPIRIDE 4 MG TABLET: 4 | 30 days supply | Qty: 30 | Fill #3

## 2016-11-28 NOTE — Telephone Encounter (Signed)
Please inform patient that her form was reviewed, the requested information will need to come from her DENTIST.  She is advised to pick up the form and take it to her dentist.

## 2016-11-29 NOTE — Telephone Encounter (Signed)
Pt was called and informed of letter being ready for pick up and faxed over.

## 2016-11-29 NOTE — Telephone Encounter (Signed)
Please inform patient the letter for appeal of dental work was written  Please fax to [phone]  Patient may pick up a copy if she desires or copy can be mailed.

## 2016-12-01 MED FILL — JANUVIA 100 MG TABLET: 100 | 30 days supply | Qty: 30 | Fill #1

## 2016-12-01 MED FILL — ATORVASTATIN 40 MG TABLET: 40 | 30 days supply | Qty: 30 | Fill #1

## 2016-12-05 ENCOUNTER — Ambulatory Visit: Payer: Medicaid Other | Attending: Family Medicine | Admitting: *Deleted

## 2016-12-05 VITALS — BP 123/79 | HR 76 | Temp 98.0°F

## 2016-12-05 DIAGNOSIS — Z23 Encounter for immunization: Secondary | ICD-10-CM

## 2016-12-05 NOTE — Progress Notes (Signed)
Pt came in today for Flu vaccine and pneumonia vaccine. Pt received and tolerated well. VIS information hand to patient. Carilyn Goodpasture, BSN, RN

## 2017-03-10 ENCOUNTER — Telehealth: Payer: Self-pay | Admitting: Family Medicine

## 2017-03-10 ENCOUNTER — Ambulatory Visit: Payer: Self-pay | Attending: Family Medicine

## 2017-03-10 NOTE — Telephone Encounter (Signed)
Patient came by the office to speak with PCP regarding her medication and medical coverage (insurance). Because patient had no insurance she couldn't make an appt. Pt has run out of her medications especially for her diabetes. PCP doesn't have any appts available until April. Pt cannot wait. Is it possible for medication to be called in to our pharmacy without appt? Please follow up.  Thank you.

## 2017-03-13 NOTE — Telephone Encounter (Signed)
Will route to PCP 

## 2017-03-14 NOTE — Telephone Encounter (Signed)
Please call back to patient I checked her med list there are refills for her diabetes medications on file at pharmacy up to 07/2017 There is refill on lipitor up to 09/2017 She must speak with pharmacy

## 2017-03-15 ENCOUNTER — Other Ambulatory Visit: Payer: Self-pay | Admitting: Obstetrics & Gynecology

## 2017-03-15 DIAGNOSIS — E8940 Asymptomatic postprocedural ovarian failure: Secondary | ICD-10-CM

## 2017-03-15 MED FILL — GLIMEPIRIDE 4 MG TABLET: 4 | 30 days supply | Qty: 30 | Fill #4

## 2017-03-15 MED FILL — **JANUVIA 100 MG TABLET: 100 | 14 days supply | Qty: 14 | Fill #2

## 2017-03-15 NOTE — Telephone Encounter (Signed)
Pt was called and informed to call pharmacy to see about medications.

## 2017-03-28 ENCOUNTER — Ambulatory Visit: Payer: Self-pay | Attending: Family Medicine

## 2017-03-28 MED FILL — **JANUVIA 100 MG TABLET: 100 | 28 days supply | Qty: 28 | Fill #3

## 2017-04-05 ENCOUNTER — Other Ambulatory Visit: Payer: Self-pay

## 2017-04-05 DIAGNOSIS — E1165 Type 2 diabetes mellitus with hyperglycemia: Secondary | ICD-10-CM

## 2017-04-05 MED ORDER — SITAGLIPTIN PHOSPHATE 100 MG PO TABS: 100.0000 mg | ORAL_TABLET | Freq: Every day | ORAL | 3 refills | Status: DC

## 2017-04-05 MED FILL — ATORVASTATIN 40 MG TABLET: 40 | 30 days supply | Qty: 30 | Fill #2

## 2017-04-17 ENCOUNTER — Ambulatory Visit: Payer: Self-pay | Attending: Family Medicine | Admitting: Family Medicine

## 2017-04-17 ENCOUNTER — Encounter: Payer: Self-pay | Admitting: Family Medicine

## 2017-04-17 VITALS — BP 127/85 | HR 90 | Temp 98.1°F | Ht 62.0 in | Wt 146.2 lb

## 2017-04-17 DIAGNOSIS — Z79899 Other long term (current) drug therapy: Secondary | ICD-10-CM | POA: Insufficient documentation

## 2017-04-17 DIAGNOSIS — E1165 Type 2 diabetes mellitus with hyperglycemia: Secondary | ICD-10-CM | POA: Insufficient documentation

## 2017-04-17 DIAGNOSIS — R21 Rash and other nonspecific skin eruption: Secondary | ICD-10-CM | POA: Insufficient documentation

## 2017-04-17 DIAGNOSIS — F17210 Nicotine dependence, cigarettes, uncomplicated: Secondary | ICD-10-CM | POA: Insufficient documentation

## 2017-04-17 DIAGNOSIS — J302 Other seasonal allergic rhinitis: Secondary | ICD-10-CM | POA: Insufficient documentation

## 2017-04-17 DIAGNOSIS — K08109 Complete loss of teeth, unspecified cause, unspecified class: Secondary | ICD-10-CM | POA: Insufficient documentation

## 2017-04-17 DIAGNOSIS — J301 Allergic rhinitis due to pollen: Secondary | ICD-10-CM

## 2017-04-17 DIAGNOSIS — K000 Anodontia: Secondary | ICD-10-CM

## 2017-04-17 LAB — GLUCOSE, POCT (MANUAL RESULT ENTRY)
POC GLUCOSE: 374 mg/dL — AB (ref 70–99)
POC Glucose: 353 mg/dl — AB (ref 70–99)

## 2017-04-17 LAB — POCT GLYCOSYLATED HEMOGLOBIN (HGB A1C): HEMOGLOBIN A1C: 10.3

## 2017-04-17 MED ORDER — CETIRIZINE HCL 10 MG PO TABS: 10.0000 mg | ORAL_TABLET | Freq: Every day | ORAL | 11 refills | Status: DC

## 2017-04-17 MED ORDER — INSULIN DETEMIR 100 UNIT/ML ~~LOC~~ SOLN: 10.0000 [IU] | Freq: Every day | SUBCUTANEOUS | 11 refills | Status: DC

## 2017-04-17 MED ORDER — INSULIN ASPART 100 UNIT/ML ~~LOC~~ SOLN
20.0000 [IU] | Freq: Once | SUBCUTANEOUS | Status: AC
  Administered 2017-04-17: 20 [IU] via SUBCUTANEOUS

## 2017-04-17 MED ORDER — "INSULIN SYRINGE-NEEDLE U-100 31G X 5/16"" 0.5 ML MISC": 1.0000 | Freq: Every day | 5 refills | Status: DC

## 2017-04-17 MED ORDER — GLIMEPIRIDE 4 MG PO TABS: 8.0000 mg | ORAL_TABLET | Freq: Every day | ORAL | 11 refills | Status: DC

## 2017-04-17 MED FILL — ?CETIRIZINE HCL 10 MG TABLE: 10 | 30 days supply | Qty: 30 | Fill #0

## 2017-04-17 MED FILL — !LEVEMIR 100 UNITS/ML VIAL: 100/ML | 28 days supply | Qty: 10 | Fill #0

## 2017-04-17 MED FILL — ?GLIMEPIRIDE 4 MG TABLET: 4 | 30 days supply | Qty: 60 | Fill #0

## 2017-04-17 NOTE — Progress Notes (Signed)
Subjective:  Patient ID: Bailey [name], female    DOB: 1956/07/13  Age: 61 y.o. MRN: [id number]  CC: Diabetes   HPI ARYIANNA EARWOOD presents for   1. CHRONIC DIABETES  Disease Monitoring  Blood Sugar Ranges: 400  Polyuria: yes   Visual problems: yes, in the L eye    Medication Compliance: taking Amaryl but no januvia , awaiting PASS medication assistance  Medication Side Effects  Hypoglycemia: no   Preventitive Health Care  Eye Exam: due   Foot Exam: done today  Diet pattern: eating high carb soft diet, edentulous upper   Exercise: minimal.  2. Rash: on face x 3 weeks. Mild pruritic. Also with dark circles under eyes. No new medication or skin products.   Social History  Substance Use Topics  . Smoking status: Current Every Day Smoker    Packs/day: 0.50    Years: 28.00  . Smokeless tobacco: Never Used  . Alcohol use No    Outpatient Medications Prior to Visit  Medication Sig Dispense Refill  . ACCU-CHEK SOFTCLIX LANCETS lancets Use as instructed 100 each 12  . atorvastatin (LIPITOR) 40 MG tablet Take 1 tablet (40 mg total) by mouth daily. 30 tablet 11  . Blood Glucose Monitoring Suppl (ACCU-CHEK AVIVA PLUS) w/Device KIT Use as directed 1 kit 0  . carbamide peroxide (DEBROX) 6.5 % otic solution Place 5 drops into the left ear 2 (two) times daily. For 5 days then return for ear irrigation 15 mL 0  . cephALEXin (KEFLEX) 500 MG capsule Take 2 capsules (1,000 mg total) by mouth 2 (two) times daily. 28 capsule 0  . Cholecalciferol (VITAMIN D3) 2000 units TABS Take 2,000 Units by mouth daily. 30 tablet 11  . glimepiride (AMARYL) 4 MG tablet Take 1 tablet (4 mg total) by mouth daily before breakfast. 30 tablet 11  . glucose blood (ACCU-CHEK AVIVA PLUS) test strip Use as instructed 100 each 12  . Lancet Devices (ACCU-CHEK SOFTCLIX) lancets Use as instructed 1 each 0  . Multiple Vitamins-Minerals (MULTIVITAMIN WITH MINERALS) tablet Take 1 tablet by mouth every other day.      Vladimir Faster Glycol-Propyl Glycol (SYSTANE) 0.4-0.3 % SOLN Apply 2 drops to eye as needed. (Patient not taking: Reported on 10/21/2016) 5 mL 5  . sitaGLIPtin (JANUVIA) 100 MG tablet Take 1 tablet (100 mg total) by mouth daily. 90 tablet 3  . VIVELLE-DOT 0.025 MG/24HR PLACE 1 PATCH ONTO THE SKIN 2 TIMES A WEEK. 8 patch 12   No facility-administered medications prior to visit.     ROS Review of Systems  Constitutional: Negative for chills and fever.  Eyes: Positive for visual disturbance.  Respiratory: Negative for shortness of breath.   Cardiovascular: Negative for chest pain.  Gastrointestinal: Negative for abdominal pain and blood in stool.  Endocrine: Positive for polydipsia and polyphagia.  Musculoskeletal: Negative for arthralgias and back pain.  Skin: Positive for rash.  Allergic/Immunologic: Negative for immunocompromised state.  Hematological: Negative for adenopathy. Does not bruise/bleed easily.  Psychiatric/Behavioral: Negative for dysphoric mood and suicidal ideas.    Objective:  BP 127/85   Pulse 90   Temp 98.1 F (36.7 C) (Oral)   Ht '5\' 2"'  (1.575 m)   Wt 146 lb 3.2 oz (66.3 kg)   SpO2 100%   BMI 26.74 kg/m   BP/Weight 04/17/2017 12/05/2016 71/05/9677  Systolic BP 938 101 751  Diastolic BP 85 79 52  Wt. (Lbs) 146.2 - -  BMI 26.74 - -  Physical Exam  Constitutional: She is oriented to person, place, and time. She appears well-developed and well-nourished. No distress.  HENT:  Head: Normocephalic and atraumatic.    Cardiovascular: Normal rate, regular rhythm, normal heart sounds and intact distal pulses.   Pulmonary/Chest: Effort normal and breath sounds normal.  Musculoskeletal: She exhibits no edema.  Neurological: She is alert and oriented to person, place, and time.  Skin: Skin is warm and dry. No rash noted.  Psychiatric: She has a normal mood and affect.    Lab Results  Component Value Date   HGBA1C 10.3 04/17/2017   CBG 374   Treated  with 20 U of novolog  Repeat CBG 353  Assessment & Plan:   Uriah was seen today for diabetes.  Diagnoses and all orders for this visit:  Uncontrolled type 2 diabetes mellitus with hyperglycemia, without long-term current use of insulin (HCC) -     POCT glucose (manual entry) -     POCT glycosylated hemoglobin (Hb A1C) -     insulin aspart (novoLOG) injection 20 Units; Inject 0.2 mLs (20 Units total) into the skin once. -     glimepiride (AMARYL) 4 MG tablet; Take 2 tablets (8 mg total) by mouth daily before breakfast. -     insulin detemir (LEVEMIR) 100 UNIT/ML injection; Inject 0.1 mLs (10 Units total) into the skin at bedtime. -     Microalbumin/Creatinine Ratio, Urine -     Ambulatory referral to Ophthalmology -     POCT glucose (manual entry)  Edentulism -     Ambulatory referral to Dentistry  Seasonal allergic rhinitis due to pollen -     cetirizine (ZYRTEC) 10 MG tablet; Take 1 tablet (10 mg total) by mouth daily.    Meds ordered this encounter  Medications  . insulin aspart (novoLOG) injection 20 Units  . glimepiride (AMARYL) 4 MG tablet    Sig: Take 2 tablets (8 mg total) by mouth daily before breakfast.    Dispense:  60 tablet    Refill:  11  . insulin detemir (LEVEMIR) 100 UNIT/ML injection    Sig: Inject 0.1 mLs (10 Units total) into the skin at bedtime.    Dispense:  10 mL    Refill:  11  . cetirizine (ZYRTEC) 10 MG tablet    Sig: Take 1 tablet (10 mg total) by mouth daily.    Dispense:  30 tablet    Refill:  11    Follow-up: Return in about 6 weeks (around 05/29/2017) for diabetes .   Boykin Nearing MD

## 2017-04-17 NOTE — Progress Notes (Signed)

## 2017-04-17 NOTE — Assessment & Plan Note (Addendum)
A: uncontrolled diabetes with hyperglycemia P: Add levemir starting at 10 U daily and titrate up to goal  Low carb soft diet provided Increase amaryl to 8 mg in AM  Awaiting januvia

## 2017-04-17 NOTE — Patient Instructions (Addendum)
Bailey [name] was seen today for diabetes.  Diagnoses and all orders for this visit:  Uncontrolled type 2 diabetes mellitus with hyperglycemia, without long-term current use of insulin (HCC) -     POCT glucose (manual entry) -     POCT glycosylated hemoglobin (Hb A1C) -     insulin aspart (novoLOG) injection 20 Units; Inject 0.2 mLs (20 Units total) into the skin once. -     glimepiride (AMARYL) 4 MG tablet; Take 2 tablets (8 mg total) by mouth daily before breakfast. -     insulin detemir (LEVEMIR) 100 UNIT/ML injection; Inject 0.1 mLs (10 Units total) into the skin at bedtime. -     Microalbumin/Creatinine Ratio, Urine -     Ambulatory referral to Ophthalmology -     POCT glucose (manual entry) -     Insulin Syringe-Needle U-100 (B-D INS SYRINGE 0.5CC/31GX5/16) 31G X 5/16" 0.5 ML MISC; 1 each by Does not apply route at bedtime.  Edentulism -     Ambulatory referral to Dentistry  Seasonal allergic rhinitis due to pollen -     cetirizine (ZYRTEC) 10 MG tablet; Take 1 tablet (10 mg total) by mouth daily.   Diabetes blood sugar goals  Fasting (in AM before breakfast, 8 hrs of no eating or drinking (except water or unsweetened coffee or tea): 90-130 2 hrs after meals: < 160,   No low sugars: nothing < 70   f/u in 6 weeks for diabetes   Dr. Adrian Blackwater

## 2017-04-18 LAB — MICROALBUMIN / CREATININE URINE RATIO
CREATININE, UR: 103 mg/dL
Microalb/Creat Ratio: 10.5 mg/g creat (ref 0.0–30.0)
Microalbumin, Urine: 10.8 ug/mL

## 2017-04-18 MED FILL — ESTRADIOL 0.025 MG PATCH: 0.025 | 30 days supply | Qty: 8 | Fill #0

## 2017-04-18 MED FILL — BD INSULIN SYR 0.5 ML 8MMX3: 31G X 5/16" | 25 days supply | Qty: 100 | Fill #0

## 2017-04-19 ENCOUNTER — Other Ambulatory Visit: Payer: Self-pay

## 2017-04-19 MED ORDER — TRUE METRIX METER DEVI: 1.0000 | Freq: Three times a day (TID) | 0 refills | Status: DC

## 2017-04-19 MED ORDER — TRUEPLUS LANCETS 28G MISC: 1.0000 | Freq: Three times a day (TID) | 11 refills | Status: DC

## 2017-04-19 MED ORDER — GLUCOSE BLOOD VI STRP: 1.0000 | ORAL_STRIP | Freq: Three times a day (TID) | 12 refills | Status: DC

## 2017-04-19 MED FILL — !TRUE METRIX BLOOD GLUCOSE: 1 days supply | Qty: 1 | Fill #0

## 2017-04-19 MED FILL — TRUEplus LANCETS 28G MISC: 33 days supply | Qty: 100 | Fill #0

## 2017-04-19 MED FILL — TRUE METRIX TEST STRIP: 33 days supply | Qty: 100 | Fill #0

## 2017-04-24 ENCOUNTER — Ambulatory Visit: Payer: Self-pay | Attending: Family Medicine

## 2017-05-04 ENCOUNTER — Other Ambulatory Visit: Payer: Self-pay

## 2017-05-04 MED ORDER — INSULIN GLARGINE 100 UNITS/ML SOLOSTAR PEN: 10.0000 [IU] | PEN_INJECTOR | Freq: Every day | SUBCUTANEOUS | 3 refills | Status: DC

## 2017-05-10 ENCOUNTER — Encounter: Payer: Self-pay | Admitting: Family Medicine

## 2017-05-10 MED FILL — ?CETIRIZINE HCL 10 MG TABLE: 10 | 30 days supply | Qty: 30 | Fill #1

## 2017-05-10 MED FILL — ?ATORVASTATIN 40MG TABLET: 40 | 30 days supply | Qty: 30 | Fill #3

## 2017-05-23 MED FILL — ?GLIMEPIRIDE 4 MG TABLET: 4 | 30 days supply | Qty: 60 | Fill #1

## 2017-05-23 MED FILL — ESTRADIOL 0.025 MG PATCH: 0.025 | 30 days supply | Qty: 8 | Fill #1

## 2017-05-30 MED FILL — TRUE METRIX TEST STRIP: 33 days supply | Qty: 100 | Fill #1

## 2017-06-01 ENCOUNTER — Encounter: Payer: Self-pay | Admitting: Family Medicine

## 2017-06-01 ENCOUNTER — Ambulatory Visit: Payer: Self-pay | Attending: Family Medicine | Admitting: Family Medicine

## 2017-06-01 VITALS — BP 116/71 | HR 72 | Temp 98.1°F | Wt 150.2 lb

## 2017-06-01 DIAGNOSIS — F17210 Nicotine dependence, cigarettes, uncomplicated: Secondary | ICD-10-CM | POA: Insufficient documentation

## 2017-06-01 DIAGNOSIS — B070 Plantar wart: Secondary | ICD-10-CM | POA: Insufficient documentation

## 2017-06-01 DIAGNOSIS — L309 Dermatitis, unspecified: Secondary | ICD-10-CM | POA: Insufficient documentation

## 2017-06-01 DIAGNOSIS — Z79899 Other long term (current) drug therapy: Secondary | ICD-10-CM | POA: Insufficient documentation

## 2017-06-01 DIAGNOSIS — Z794 Long term (current) use of insulin: Secondary | ICD-10-CM | POA: Insufficient documentation

## 2017-06-01 DIAGNOSIS — E1165 Type 2 diabetes mellitus with hyperglycemia: Secondary | ICD-10-CM | POA: Insufficient documentation

## 2017-06-01 LAB — GLUCOSE, POCT (MANUAL RESULT ENTRY): POC GLUCOSE: 184 mg/dL — AB (ref 70–99)

## 2017-06-01 MED ORDER — HYDROCORTISONE VALERATE 0.2 % EX OINT: 1.0000 "application " | TOPICAL_OINTMENT | Freq: Two times a day (BID) | CUTANEOUS | 0 refills | Status: DC

## 2017-06-01 MED ORDER — INSULIN DETEMIR 100 UNIT/ML ~~LOC~~ SOLN
15.0000 [IU] | Freq: Every day | SUBCUTANEOUS | 11 refills | Status: DC
Start: 2017-06-01 — End: 2017-07-04

## 2017-06-01 MED FILL — !LEVEMIR 100 UNITS/ML VIAL: 100/ML | 28 days supply | Qty: 10 | Fill #0

## 2017-06-01 MED FILL — HYDROCORTISONE VAL 0.2% OIN: 0.2 | 15 days supply | Qty: 45 | Fill #0

## 2017-06-01 NOTE — Assessment & Plan Note (Signed)
R medial foot Treated with cryotherapy

## 2017-06-01 NOTE — Assessment & Plan Note (Signed)
westcort ointment

## 2017-06-01 NOTE — Patient Instructions (Addendum)
Bailey [name] was seen today for diabetes and rash.  Diagnoses and all orders for this visit:  Uncontrolled type 2 diabetes mellitus with hyperglycemia, without long-term current use of insulin (HCC) -     POCT glucose (manual entry) -     insulin detemir (LEVEMIR) 100 UNIT/ML injection; Inject 0.15 mLs (15 Units total) into the skin at bedtime.  Plantar wart of right foot  Facial dermatitis -     hydrocortisone valerate ointment (WESTCORT) 0.2 %; Apply 1 application topically 2 (two) times daily.   Diabetes blood sugar goals  Fasting (in AM before breakfast, 8 hrs of no eating or drinking (except water or unsweetened coffee or tea): 90-130 2 hrs after meals: < 160,   No low sugars: nothing < 70   Increase levemir to 15 U now  Continue to monitor blood sugars  Increase levemir to 20 U after one week if blood sugar is above goal as long as there are no low sugars   Dr. Roselie Awkward refills your estrogen patches and sends the prescriptions here, he is your gynecologist, please call to his office for refill.  [phone]  F/u in 4 week for diabetes   Dr. Adrian Blackwater

## 2017-06-01 NOTE — Progress Notes (Signed)
Subjective:  Patient ID: Bailey [name], female    DOB: 1956-10-09  Age: 61 y.o. MRN: [id number]  CC: Diabetes and Rash   HPI Bailey [name] presents for   1. CHRONIC DIABETES  Disease Monitoring  Blood Sugar Ranges:   Fasting: 127-214  Post prandial: 123-316 (05/28/2017)  Polyuria: yes   Visual problems: yes, in the L eye    Medication Compliance: taking Amaryl, januvia,  Medication Side Effects  Hypoglycemia: no   Preventitive Health Care  Eye Exam: due   Foot Exam: done today  Diet pattern: eating high carb soft diet, edentulous upper   Exercise: minimal.  2. Rash: on face x 6 weeks. Mild pruritic. Also with dark circles under eyes. No new medication or skin products. She is taking   3. R medial foot bump: x 3 months. itchy at times. No pain or bleeding.   Social History  Substance Use Topics  . Smoking status: Current Every Day Smoker    Packs/day: 0.50    Years: 28.00  . Smokeless tobacco: Never Used  . Alcohol use No    Outpatient Medications Prior to Visit  Medication Sig Dispense Refill  . atorvastatin (LIPITOR) 40 MG tablet Take 1 tablet (40 mg total) by mouth daily. 30 tablet 11  . Blood Glucose Monitoring Suppl (TRUE METRIX METER) DEVI 1 each by Does not apply route 3 (three) times daily. 1 Device 0  . carbamide peroxide (DEBROX) 6.5 % otic solution Place 5 drops into the left ear 2 (two) times daily. For 5 days then return for ear irrigation 15 mL 0  . cetirizine (ZYRTEC) 10 MG tablet Take 1 tablet (10 mg total) by mouth daily. 30 tablet 11  . Cholecalciferol (VITAMIN D3) 2000 units TABS Take 2,000 Units by mouth daily. 30 tablet 11  . glimepiride (AMARYL) 4 MG tablet Take 2 tablets (8 mg total) by mouth daily before breakfast. 60 tablet 11  . glucose blood (TRUE METRIX BLOOD GLUCOSE TEST) test strip 1 each by Other route 3 (three) times daily. 100 each 12  . insulin detemir (LEVEMIR) 100 UNIT/ML injection Inject 0.1 mLs (10 Units total) into the  skin at bedtime. 10 mL 11  . insulin glargine (LANTUS) 100 unit/mL SOPN Inject 0.1 mLs (10 Units total) into the skin at bedtime. 54 mL 3  . Insulin Syringe-Needle U-100 (B-D INS SYRINGE 0.5CC/31GX5/16) 31G X 5/16" 0.5 ML MISC 1 each by Does not apply route at bedtime. 30 each 5  . Lancet Devices (ACCU-CHEK SOFTCLIX) lancets Use as instructed 1 each 0  . Multiple Vitamins-Minerals (MULTIVITAMIN WITH MINERALS) tablet Take 1 tablet by mouth every other day.     . sitaGLIPtin (JANUVIA) 100 MG tablet Take 1 tablet (100 mg total) by mouth daily. (Patient not taking: Reported on 04/17/2017) 90 tablet 3  . TRUEPLUS LANCETS 28G MISC 1 each by Does not apply route 3 (three) times daily. 100 each 11  . VIVELLE-DOT 0.025 MG/24HR PLACE 1 PATCH ONTO THE SKIN 2 TIMES A WEEK. 8 patch 12   No facility-administered medications prior to visit.     ROS Review of Systems  Constitutional: Negative for chills and fever.  Eyes: Positive for visual disturbance.  Respiratory: Negative for shortness of breath.   Cardiovascular: Negative for chest pain.  Gastrointestinal: Negative for abdominal pain and blood in stool.  Endocrine: Positive for polydipsia and polyphagia.  Musculoskeletal: Negative for arthralgias and back pain.  Skin: Positive for rash.  Allergic/Immunologic: Negative for  immunocompromised state.  Hematological: Negative for adenopathy. Does not bruise/bleed easily.  Psychiatric/Behavioral: Negative for dysphoric mood and suicidal ideas.    Objective:  BP 116/71   Pulse 72   Temp 98.1 F (36.7 C) (Oral)   Wt 150 lb 3.2 oz (68.1 kg)   SpO2 100%   BMI 27.47 kg/m   BP/Weight 06/01/2017 04/17/2017 40/81/4481  Systolic BP 856 314 970  Diastolic BP 71 85 79  Wt. (Lbs) 150.2 146.2 -  BMI 27.47 26.74 -    Physical Exam  Constitutional: She is oriented to person, place, and time. She appears well-developed and well-nourished. No distress.  HENT:  Head: Normocephalic and atraumatic.     Cardiovascular: Normal rate, regular rhythm, normal heart sounds and intact distal pulses.   Pulmonary/Chest: Effort normal and breath sounds normal.  Musculoskeletal: She exhibits no edema.       Feet:  Neurological: She is alert and oriented to person, place, and time.  Skin: Skin is warm and dry. No rash noted.  Psychiatric: She has a normal mood and affect.    Lab Results  Component Value Date   HGBA1C 10.3 04/17/2017   CBG 184  R foot papule consistent with plantar wart Treated with cryotherapy x 3 freeze and thaw cycles  Assessment & Plan:  Bailey [name] was seen today for diabetes and rash.  Diagnoses and all orders for this visit:  Uncontrolled type 2 diabetes mellitus with hyperglycemia, without long-term current use of insulin (HCC) -     POCT glucose (manual entry) -     insulin detemir (LEVEMIR) 100 UNIT/ML injection; Inject 0.15 mLs (15 Units total) into the skin at bedtime.  Plantar wart of right foot  Facial dermatitis -     hydrocortisone valerate ointment (WESTCORT) 0.2 %; Apply 1 application topically 2 (two) times daily.   No orders of the defined types were placed in this encounter.   Follow-up: Return in about 4 weeks (around 06/29/2017) for diabetes.   Boykin Nearing MD

## 2017-06-01 NOTE — Assessment & Plan Note (Addendum)
A: improved Med: compliant P: Continue amaryl 8 mg daily and januvia 100 mg daily levemir increased to 15 U daily

## 2017-06-06 ENCOUNTER — Other Ambulatory Visit: Payer: Self-pay | Admitting: Family Medicine

## 2017-06-06 DIAGNOSIS — L309 Dermatitis, unspecified: Secondary | ICD-10-CM

## 2017-06-06 MED ORDER — HYDROCORTISONE VALERATE 0.2 % EX CREA: 1.0000 "application " | TOPICAL_CREAM | Freq: Two times a day (BID) | CUTANEOUS | 0 refills | Status: DC

## 2017-06-12 MED FILL — ATORVASTATIN 40 MG TABLET: 40 | 30 days supply | Qty: 30 | Fill #4

## 2017-06-12 MED FILL — ?CETIRIZINE HCL 10 MG TABLE: 10 | 30 days supply | Qty: 30 | Fill #2

## 2017-06-14 MED FILL — ESTRADIOL 0.025 MG PATCH: 0.025 | 30 days supply | Qty: 8 | Fill #2

## 2017-06-19 ENCOUNTER — Encounter: Payer: Self-pay | Admitting: Pharmacist

## 2017-06-19 NOTE — Progress Notes (Signed)
Received prior authorization from [facility] for Levemir. Patient does not currently have Medicaid coverage. Will disregard prior authorization request.

## 2017-06-20 ENCOUNTER — Telehealth: Payer: Self-pay | Admitting: Family Medicine

## 2017-06-20 NOTE — Telephone Encounter (Signed)
[employer] my [employer] called to speak with you about PT med. She ask for you to call her back Ref# [id number] Milan call her @ [phone]

## 2017-06-20 NOTE — Telephone Encounter (Signed)
Went to [employer] and deleted request [id number]. Patient does not have insurance - note sent to CoverMyMeds.

## 2017-06-26 MED FILL — ?GLIMEPIRIDE 4 MG TABLET: 4 | 30 days supply | Qty: 60 | Fill #2

## 2017-07-04 ENCOUNTER — Ambulatory Visit: Payer: Self-pay | Attending: Family Medicine | Admitting: Family Medicine

## 2017-07-04 ENCOUNTER — Encounter: Payer: Self-pay | Admitting: Family Medicine

## 2017-07-04 VITALS — BP 107/67 | HR 76 | Temp 98.0°F | Ht 62.0 in | Wt 150.4 lb

## 2017-07-04 DIAGNOSIS — M1990 Unspecified osteoarthritis, unspecified site: Secondary | ICD-10-CM | POA: Insufficient documentation

## 2017-07-04 DIAGNOSIS — Z0000 Encounter for general adult medical examination without abnormal findings: Secondary | ICD-10-CM

## 2017-07-04 DIAGNOSIS — F17210 Nicotine dependence, cigarettes, uncomplicated: Secondary | ICD-10-CM | POA: Insufficient documentation

## 2017-07-04 DIAGNOSIS — Z79899 Other long term (current) drug therapy: Secondary | ICD-10-CM | POA: Insufficient documentation

## 2017-07-04 DIAGNOSIS — B070 Plantar wart: Secondary | ICD-10-CM

## 2017-07-04 DIAGNOSIS — E1165 Type 2 diabetes mellitus with hyperglycemia: Secondary | ICD-10-CM | POA: Insufficient documentation

## 2017-07-04 DIAGNOSIS — Z794 Long term (current) use of insulin: Secondary | ICD-10-CM | POA: Insufficient documentation

## 2017-07-04 LAB — GLUCOSE, POCT (MANUAL RESULT ENTRY): POC Glucose: 195 mg/dl — AB (ref 70–99)

## 2017-07-04 LAB — POCT GLYCOSYLATED HEMOGLOBIN (HGB A1C): HEMOGLOBIN A1C: 10.3

## 2017-07-04 MED ORDER — INSULIN DETEMIR 100 UNIT/ML ~~LOC~~ SOLN: 25.0000 [IU] | Freq: Every day | SUBCUTANEOUS | 11 refills | Status: DC

## 2017-07-04 MED ORDER — CELECOXIB 100 MG PO CAPS: 100.0000 mg | ORAL_CAPSULE | Freq: Two times a day (BID) | ORAL | 2 refills | Status: DC

## 2017-07-04 MED ORDER — ACETAMINOPHEN ER 650 MG PO TBCR: 650.0000 mg | EXTENDED_RELEASE_TABLET | Freq: Two times a day (BID) | ORAL | 2 refills | Status: DC

## 2017-07-04 MED FILL — CELECOXIB 100 MG CAP: 100 | 30 days supply | Qty: 60 | Fill #0

## 2017-07-04 NOTE — Progress Notes (Signed)
Subjective:  Patient ID: Bailey [name], female    DOB: [birth year]  Age: 61 y.o. MRN: [id number]  CC: Diabetes   HPI ZOIEE WIMMER presents for   1. CHRONIC DIABETES  Disease Monitoring  Blood Sugar Ranges:   Fasting: 151-220  Post prandial: 151-329  Polyuria: yes   Visual problems: no   Medication Compliance: yes  Medication Side Effects  Hypoglycemia: no   Preventitive Health Care  Eye Exam: due   Foot Exam: done today  Diet pattern: eating high carb soft diet, edentulous upper   Exercise: minimal.  2. Rash: improved with westcort 0..2 % cream. No itching. Darkening has improved.   3. R medial foot bump: improved following last cryotherapy treatment. No longer tender.   Social History  Substance Use Topics  . Smoking status: Current Every Day Smoker    Packs/day: 0.50    Years: 28.00  . Smokeless tobacco: Never Used  . Alcohol use No    Outpatient Medications Prior to Visit  Medication Sig Dispense Refill  . atorvastatin (LIPITOR) 40 MG tablet Take 1 tablet (40 mg total) by mouth daily. 30 tablet 11  . Blood Glucose Monitoring Suppl (TRUE METRIX METER) DEVI 1 each by Does not apply route 3 (three) times daily. 1 Device 0  . carbamide peroxide (DEBROX) 6.5 % otic solution Place 5 drops into the left ear 2 (two) times daily. For 5 days then return for ear irrigation 15 mL 0  . Cholecalciferol (VITAMIN D3) 2000 units TABS Take 2,000 Units by mouth daily. 30 tablet 11  . glimepiride (AMARYL) 4 MG tablet Take 2 tablets (8 mg total) by mouth daily before breakfast. 60 tablet 11  . glucose blood (TRUE METRIX BLOOD GLUCOSE TEST) test strip 1 each by Other route 3 (three) times daily. 100 each 12  . hydrocortisone valerate cream (WESTCORT) 0.2 % Apply 1 application topically 2 (two) times daily. 45 g 0  . insulin detemir (LEVEMIR) 100 UNIT/ML injection Inject 0.15 mLs (15 Units total) into the skin at bedtime. 10 mL 11  . Insulin Syringe-Needle U-100 (B-D INS  SYRINGE 0.5CC/31GX5/16) 31G X 5/16" 0.5 ML MISC 1 each by Does not apply route at bedtime. 30 each 5  . Lancet Devices (ACCU-CHEK SOFTCLIX) lancets Use as instructed 1 each 0  . Multiple Vitamins-Minerals (MULTIVITAMIN WITH MINERALS) tablet Take 1 tablet by mouth every other day.     . sitaGLIPtin (JANUVIA) 100 MG tablet Take 1 tablet (100 mg total) by mouth daily. 90 tablet 3  . TRUEPLUS LANCETS 28G MISC 1 each by Does not apply route 3 (three) times daily. 100 each 11  . VIVELLE-DOT 0.025 MG/24HR PLACE 1 PATCH ONTO THE SKIN 2 TIMES A WEEK. 8 patch 12  . cetirizine (ZYRTEC) 10 MG tablet Take 1 tablet (10 mg total) by mouth daily. (Patient not taking: Reported on 07/04/2017) 30 tablet 11   No facility-administered medications prior to visit.     ROS Review of Systems  Constitutional: Negative for chills and fever.  Respiratory: Negative for shortness of breath.   Cardiovascular: Negative for chest pain.  Gastrointestinal: Negative for abdominal pain and blood in stool.  Musculoskeletal: Positive for arthralgias and neck pain. Negative for back pain and joint swelling. Gait problem: R shouler, posterior neck, knees   Skin: Positive for rash.  Allergic/Immunologic: Negative for immunocompromised state.  Hematological: Negative for adenopathy. Does not bruise/bleed easily.  Psychiatric/Behavioral: Negative for dysphoric mood and suicidal ideas.    Objective:  BP 107/67   Pulse 76   Temp 98 F (36.7 C) (Oral)   Ht _0  (1.575 m)   Wt 150 lb 6.4 oz (68.2 kg)   SpO2 99%   BMI 27.51 kg/m   BP/Weight 07/04/2017 06/01/2017 3/84/6659  Systolic BP 935 701 779  Diastolic BP 67 71 85  Wt. (Lbs) 150.4 150.2 146.2  BMI 27.51 27.47 26.74    Physical Exam  Constitutional: She is oriented to person, place, and time. She appears well-developed and well-nourished. No distress.  HENT:  Head: Normocephalic and atraumatic.    Cardiovascular: Normal rate, regular rhythm, normal heart sounds and  intact distal pulses.   Pulmonary/Chest: Effort normal and breath sounds normal.  Musculoskeletal: She exhibits no edema.  Neurological: She is alert and oriented to person, place, and time.  Skin: Skin is warm and dry. No rash noted.  Psychiatric: She has a normal mood and affect.    Lab Results  Component Value Date   HGBA1C 10.3 04/17/2017   Lab Results  Component Value Date   HGBA1C 10.3 07/04/2017    CBG 195    Assessment & Plan:  Marcile was seen today for diabetes.  Diagnoses and all orders for this visit:  Uncontrolled type 2 diabetes mellitus with hyperglycemia, without long-term current use of insulin (HCC) -     POCT glucose (manual entry) -     POCT glycosylated hemoglobin (Hb A1C) -     insulin detemir (LEVEMIR) 100 UNIT/ML injection; Inject 0.25 mLs (25 Units total) into the skin at bedtime. Advised to self taper if fasting sugars elevated -     CMP14+EGFR -     Lipid panel  Healthcare maintenance -     Ambulatory referral to Gastroenterology  Arthritis -     acetaminophen (TYLENOL 8 HOUR) 650 MG CR tablet; Take 1 tablet (650 mg total) by mouth 2 (two) times daily. -     celecoxib (CELEBREX) 100 MG capsule; Take 1 capsule (100 mg total) by mouth 2 (two) times daily.   No orders of the defined types were placed in this encounter.   Follow-up: Return in about 4 weeks (around 08/01/2017) for CBG review .   Boykin Nearing MD

## 2017-07-04 NOTE — Patient Instructions (Addendum)
Bailey [name] was seen today for diabetes.  Diagnoses and all orders for this visit:  Uncontrolled type 2 diabetes mellitus with hyperglycemia, without long-term current use of insulin (HCC) -     POCT glucose (manual entry) -     POCT glycosylated hemoglobin (Hb A1C) -     insulin detemir (LEVEMIR) 100 UNIT/ML injection; Inject 0.25 mLs (25 Units total) into the skin at bedtime. Advised to self taper if fasting sugars elevated -     CMP14+EGFR -     Lipid panel  Healthcare maintenance -     Ambulatory referral to Gastroenterology  Arthritis -     acetaminophen (TYLENOL 8 HOUR) 650 MG CR tablet; Take 1 tablet (650 mg total) by mouth 2 (two) times daily. -     celecoxib (CELEBREX) 100 MG capsule; Take 1 capsule (100 mg total) by mouth 2 (two) times daily.   Diabetes blood sugar goals  Fasting (in AM before breakfast, 8 hrs of no eating or drinking (except water or unsweetened coffee or tea): 90-110 2 hrs after meals: < 160,   No low sugars: nothing < 70   If fasting sugar is over 130 increase levemir by 2 Units the following night Write down dose to keep track   F/u in 4 weeks with pharmacist for blood sugar review F/u in 3 months with provider for diabetes and arthritis   Dr. Adrian Blackwater

## 2017-07-05 LAB — CMP14+EGFR
A/G RATIO: 1.5 (ref 1.2–2.2)
ALT: 32 IU/L (ref 0–32)
AST: 24 IU/L (ref 0–40)
Albumin: 4.3 g/dL (ref 3.6–4.8)
Alkaline Phosphatase: 83 IU/L (ref 39–117)
BILIRUBIN TOTAL: 0.4 mg/dL (ref 0.0–1.2)
BUN/Creatinine Ratio: 13 (ref 12–28)
BUN: 10 mg/dL (ref 8–27)
CHLORIDE: 103 mmol/L (ref 96–106)
CO2: 23 mmol/L (ref 20–29)
Calcium: 9.3 mg/dL (ref 8.7–10.3)
Creatinine, Ser: 0.79 mg/dL (ref 0.57–1.00)
GFR calc Af Amer: 92 mL/min/{1.73_m2} (ref >59)
GFR calc non Af Amer: 80 mL/min/{1.73_m2} (ref >59)
GLUCOSE: 202 mg/dL — AB (ref 65–99)
Globulin, Total: 2.8 g/dL (ref 1.5–4.5)
POTASSIUM: 4.2 mmol/L (ref 3.5–5.2)
Sodium: 141 mmol/L (ref 134–144)
Total Protein: 7.1 g/dL (ref 6.0–8.5)

## 2017-07-05 LAB — LIPID PANEL
CHOLESTEROL TOTAL: 103 mg/dL (ref 100–199)
Chol/HDL Ratio: 2.6 ratio (ref 0.0–4.4)
HDL: 40 mg/dL (ref >39)
LDL Calculated: 39 mg/dL (ref 0–99)
TRIGLYCERIDES: 120 mg/dL (ref 0–149)
VLDL Cholesterol Cal: 24 mg/dL (ref 5–40)

## 2017-07-05 NOTE — Assessment & Plan Note (Signed)
Improved with cryotherapy.

## 2017-07-05 NOTE — Assessment & Plan Note (Signed)
Uncontrolled diabetes type 2 Increase levemir from 15 to 25 U daily

## 2017-07-06 ENCOUNTER — Telehealth: Payer: Self-pay

## 2017-07-06 NOTE — Telephone Encounter (Signed)
Pt was called and informed of lab results. 

## 2017-07-12 MED FILL — ?CETIRIZINE HCL 10 MG TABLE: 10 | 30 days supply | Qty: 30 | Fill #3

## 2017-07-12 MED FILL — TRUE METRIX TEST STRIP: 33 days supply | Qty: 100 | Fill #2

## 2017-07-12 MED FILL — ?ATORVASTATIN 40MG TABLET: 40 | 30 days supply | Qty: 30 | Fill #5

## 2017-07-13 ENCOUNTER — Telehealth: Payer: Self-pay

## 2017-07-13 NOTE — Telephone Encounter (Signed)
Pt was called and informed of lab results. 

## 2017-07-21 MED FILL — TRUEPLUS SYR 0.5ML 30GX5/16: 30G X 5/16" | 100 days supply | Qty: 100 | Fill #0

## 2017-07-27 ENCOUNTER — Emergency Department (HOSPITAL_COMMUNITY): Payer: Self-pay

## 2017-07-27 ENCOUNTER — Emergency Department (HOSPITAL_COMMUNITY)
Admission: EM | Admit: 2017-07-27 | Discharge: 2017-07-27 | Disposition: A | Discharge status: Home / Self Care | Payer: Self-pay | Attending: Physician Assistant | Admitting: Physician Assistant

## 2017-07-27 ENCOUNTER — Encounter (HOSPITAL_COMMUNITY): Payer: Self-pay | Admitting: Emergency Medicine

## 2017-07-27 DIAGNOSIS — W108XXA Fall (on) (from) other stairs and steps, initial encounter: Secondary | ICD-10-CM | POA: Insufficient documentation

## 2017-07-27 DIAGNOSIS — E1165 Type 2 diabetes mellitus with hyperglycemia: Secondary | ICD-10-CM | POA: Insufficient documentation

## 2017-07-27 DIAGNOSIS — Y929 Unspecified place or not applicable: Secondary | ICD-10-CM | POA: Insufficient documentation

## 2017-07-27 DIAGNOSIS — Z794 Long term (current) use of insulin: Secondary | ICD-10-CM | POA: Insufficient documentation

## 2017-07-27 DIAGNOSIS — F17210 Nicotine dependence, cigarettes, uncomplicated: Secondary | ICD-10-CM | POA: Insufficient documentation

## 2017-07-27 DIAGNOSIS — W19XXXA Unspecified fall, initial encounter: Secondary | ICD-10-CM

## 2017-07-27 DIAGNOSIS — Y939 Activity, unspecified: Secondary | ICD-10-CM | POA: Insufficient documentation

## 2017-07-27 DIAGNOSIS — Z91040 Latex allergy status: Secondary | ICD-10-CM | POA: Insufficient documentation

## 2017-07-27 DIAGNOSIS — Z79899 Other long term (current) drug therapy: Secondary | ICD-10-CM | POA: Insufficient documentation

## 2017-07-27 DIAGNOSIS — Y999 Unspecified external cause status: Secondary | ICD-10-CM | POA: Insufficient documentation

## 2017-07-27 DIAGNOSIS — S20211A Contusion of right front wall of thorax, initial encounter: Secondary | ICD-10-CM

## 2017-07-27 DIAGNOSIS — S20219A Contusion of unspecified front wall of thorax, initial encounter: Secondary | ICD-10-CM | POA: Insufficient documentation

## 2017-07-27 MED ORDER — ACETAMINOPHEN 500 MG PO TABS: 500.0000 mg | ORAL_TABLET | Freq: Four times a day (QID) | ORAL | 0 refills | Status: DC | PRN

## 2017-07-27 MED ORDER — ACETAMINOPHEN 325 MG PO TABS
650.0000 mg | ORAL_TABLET | Freq: Once | ORAL | Status: AC
  Administered 2017-07-27: 650 mg via ORAL
  Filled 2017-07-27: qty 2

## 2017-07-27 NOTE — ED Provider Notes (Signed)
[facility] DEPT Provider Note   CSN: [id number] Arrival date & time: 07/27/17  1627  By signing my name below, I, Marcello Moores, attest that this documentation has been prepared under the direction and in the presence of Waynetta Pean, PA-C. Electronically Signed: Marcello Moores, ED Scribe. 07/27/17. 7:05 PM.  History   Chief Complaint Chief Complaint  Patient presents with  . Fall  . Back Pain   The history is provided by the patient. No language interpreter was used.   HPI Comments: Bailey [name] is a 61 y.o. female who presents to the Emergency Department complaining of gradually worsening, persistent mid-lower right-sided back pain that began s/p a mechanical fall that occurred an hour ago. The pt states that she slipped on wooden wet steps and fell down 3 stairs landing on her back and tailbone. The pt denies LOC, but states that the stairs lightly tapped the back of her head. The pt is able to ambulate normally. Movement and breathing deeply exacerbates her pain. She denies SOB.  She denies dizziness, abdominal pain, nausea, weakness, visual disturbances, chest pain, SOB, bowel/bladder incontinence and arthralgias.  Past Medical History:  Diagnosis Date  . Arthritis    RIGHT shoulder, knees  . Cervical radiculopathy at C5   . Dupuytren's contracture of both hands    L > R  . Dyslipidemia   . History of adenomatous polyp of colon    1997 and 2003    . History of chronic pelvic inflammatory disease   . History of gastric ulcer    2008  . Hypertriglyceridemia   . Right carpal tunnel syndrome   . Type 2 diabetes mellitus, uncontrolled ([place])    last A1c 8.9 on 07-07-2016  . Vitamin D deficiency     Patient Active Problem List   Diagnosis Date Noted  . Plantar wart of right foot 06/01/2017  . Right sided weakness 12/24/2015  . Dyslipidemia with low high density lipoprotein (HDL) cholesterol with hypertriglyceridemia due to type 2 diabetes mellitus ([place])  11/16/2015  . Uncontrolled type 2 diabetes mellitus with hyperglycemia ([place]) 11/12/2015  . Menopausal symptoms 11/12/2015  . Dupuytren's contracture of both hands 07/05/2013  . Need for Tdap vaccination 07/05/2013  . Arthritis     Past Surgical History:  Procedure Laterality Date  . COLONOSCOPY  last one 07-15-2016  . ESOPHAGOGASTRODUODENOSCOPY  last one 2008  . FASCIECTOMY Left 08/24/2016   Procedure: LEFT HAND PALMER AND SMALL FINGER DIGiTAL FASCIECTOMY AND RELEASE;  Surgeon: Iran Planas, MD;  Location: [facility];  Service: Orthopedics;  Laterality: Left;  . LAPAROSCOPIC CHOLECYSTECTOMY  07/17/2000  . ROTATOR CUFF REPAIR Right 2000  . TOTAL ABDOMINAL HYSTERECTOMY W/ BILATERAL SALPINGOOPHORECTOMY  1998    OB History    No data available       Home Medications    Prior to Admission medications   Medication Sig Start Date End Date Taking? Authorizing Provider  atorvastatin (LIPITOR) 40 MG tablet Take 1 tablet (40 mg total) by mouth daily. 10/21/16  Yes Funches, Josalyn, MD  celecoxib (CELEBREX) 100 MG capsule Take 1 capsule (100 mg total) by mouth 2 (two) times daily. 07/04/17  Yes Funches, Josalyn, MD  cetirizine (ZYRTEC) 10 MG tablet Take 1 tablet (10 mg total) by mouth daily. 04/17/17  Yes Funches, Josalyn, MD  Cholecalciferol (VITAMIN D3) 2000 units TABS Take 2,000 Units by mouth daily. 10/21/16  Yes Funches, Josalyn, MD  glimepiride (AMARYL) 4 MG tablet Take 2 tablets (8 mg total) by  mouth daily before breakfast. 04/17/17  Yes Funches, Josalyn, MD  hydrocortisone valerate cream (WESTCORT) 0.2 % Apply 1 application topically 2 (two) times daily. 06/06/17  Yes Funches, Josalyn, MD  insulin detemir (LEVEMIR) 100 UNIT/ML injection Inject 0.25 mLs (25 Units total) into the skin at bedtime. Advised to self taper if fasting sugars elevated 07/04/17  Yes Funches, Josalyn, MD  Multiple Vitamins-Minerals (MULTIVITAMIN WITH MINERALS) tablet Take 1 tablet by mouth every other  day.    Yes [provider]  sitaGLIPtin (JANUVIA) 100 MG tablet Take 1 tablet (100 mg total) by mouth daily. 04/05/17  Yes Funches, Adriana Mccallum, MD  VIVELLE-DOT 0.025 MG/24HR PLACE 1 PATCH ONTO THE SKIN 2 TIMES A WEEK. 04/06/17  Yes Woodroe Mode, MD  acetaminophen (TYLENOL) 500 MG tablet Take 1 tablet (500 mg total) by mouth every 6 (six) hours as needed for mild pain or moderate pain. 07/27/17   Waynetta Pean, PA-C  Blood Glucose Monitoring Suppl (TRUE METRIX METER) DEVI 1 each by Does not apply route 3 (three) times daily. 04/19/17   Funches, Adriana Mccallum, MD  carbamide peroxide (DEBROX) 6.5 % otic solution Place 5 drops into the left ear 2 (two) times daily. For 5 days then return for ear irrigation Patient not taking: Reported on 07/27/2017 08/08/16   Boykin Nearing, MD  glucose blood (TRUE METRIX BLOOD GLUCOSE TEST) test strip 1 each by Other route 3 (three) times daily. 04/19/17   Funches, Adriana Mccallum, MD  Insulin Syringe-Needle U-100 (B-D INS SYRINGE 0.5CC/31GX5/16) 31G X 5/16" 0.5 ML MISC 1 each by Does not apply route at bedtime. 04/17/17   Boykin Nearing, MD  Lancet Devices [facility]) lancets Use as instructed 06/15/16   Boykin Nearing, MD  TRUEPLUS LANCETS 28G MISC 1 each by Does not apply route 3 (three) times daily. 04/19/17   Boykin Nearing, MD    Family History Family History  Problem Relation Age of Onset  . Diabetes Mother   . Hypertension Mother   . Dementia Mother   . Pancreatitis Mother   . Asthma Mother   . COPD Mother   . Stroke Mother   . Hyperlipidemia Mother   . Heart disease Mother   . Hypertension Father   . Hypertension Sister   . Diabetes Sister   . Hyperlipidemia Sister   . Hypertension Brother   . Diabetes Brother   . Drug abuse Brother   . Cancer Brother        colon  . Hyperlipidemia Brother   . Hyperlipidemia Brother   . Hypertension Brother   . Hyperlipidemia Sister   . Hypertension Sister   . Diabetes Sister   . Diabetes Maternal  Grandmother     Social History Social History  Substance Use Topics  . Smoking status: Current Every Day Smoker    Packs/day: 0.50    Years: 28.00    Types: Cigarettes  . Smokeless tobacco: Never Used  . Alcohol use No     Allergies   Metformin and related; Shellfish allergy; Ivp dye [iodinated diagnostic agents]; Adhesive [tape]; Codeine; Hydrocodone-acetaminophen; Latex; and Penicillins   Review of Systems Review of Systems  Constitutional: Negative for fever.  HENT: Negative for nosebleeds.   Eyes: Negative for pain and visual disturbance.  Respiratory: Negative for shortness of breath.   Cardiovascular: Negative for chest pain.  Gastrointestinal: Negative for abdominal pain, nausea and vomiting.  Genitourinary: Negative for difficulty urinating and hematuria.  Musculoskeletal: Positive for back pain. Negative for arthralgias, gait problem, neck pain  and neck stiffness.  Skin: Negative for rash and wound.  Neurological: Negative for syncope, weakness and numbness.  Hematological: Does not bruise/bleed easily.     Physical Exam Updated Vital Signs BP (!) 142/78 (BP Location: Left Arm)   Pulse 77   Temp 98.4 F (36.9 C) (Oral)   Resp 18   Ht 5\' 2"  (1.575 m)   Wt 68 kg (150 lb)   SpO2 100%   BMI 27.44 kg/m   Physical Exam  Constitutional: She is oriented to person, place, and time. She appears well-developed and well-nourished. No distress.  Nontoxic appearing.  HENT:  Head: Normocephalic and atraumatic.  Right Ear: External ear normal.  Left Ear: External ear normal.  Mouth/Throat: Oropharynx is clear and moist.  Bilateral tympanic membranes are pearly-gray without erythema or loss of landmarks.  No visible or palpated signs of head injury or trauma.  Eyes: Pupils are equal, round, and reactive to light. Conjunctivae and EOM are normal. Right eye exhibits no discharge. Left eye exhibits no discharge.  Neck: Normal range of motion. Neck supple. No JVD  present. No tracheal deviation present.  No midline neck tenderness to palpation.  Cardiovascular: Normal rate, regular rhythm, normal heart sounds and intact distal pulses.  Exam reveals no gallop and no friction rub.   No murmur heard. Pulses:      Radial pulses are 2+ on the right side, and 2+ on the left side.  Pulmonary/Chest: Effort normal and breath sounds normal. No stridor. No respiratory distress. She has no wheezes. She has no rales. She exhibits tenderness.  Patient has some mild right lateral chest wall tenderness to palpation. No crepitus. No deformity. No overlying skin changes. Symmetric chest expansion bilaterally. Lungs clear to auscultation bilaterally.  Abdominal: Soft. She exhibits no mass. There is no tenderness. There is no guarding.  Musculoskeletal: Normal range of motion. She exhibits no edema, tenderness or deformity.  No midline neck or back tenderness to palpation. Patient's bilateral shoulder, elbow, wrist, hip, knee and ankle joints are supple and nontender to palpation.  Lymphadenopathy:    She has no cervical adenopathy.  Neurological: She is alert and oriented to person, place, and time. No cranial nerve deficit or sensory deficit. She exhibits normal muscle tone. Coordination normal.  Skin: Skin is warm and dry. Capillary refill takes less than 2 seconds. No rash noted. She is not diaphoretic. No erythema. No pallor.  Psychiatric: She has a normal mood and affect. Her behavior is normal.  Nursing note and vitals reviewed.    ED Treatments / Results   DIAGNOSTIC STUDIES: Oxygen Saturation is 100% on RA, normal by my interpretation.   COORDINATION OF CARE: 4:45 PM-Discussed next steps with pt. Pt verbalized understanding and is agreeable with the plan.   Labs (all labs ordered are listed, but only abnormal results are displayed) Labs Reviewed - No data to display  EKG  EKG Interpretation None       Radiology Dg Ribs Unilateral W/chest  Right  Result Date: 07/27/2017 CLINICAL DATA:  61 year old female with diffuse right-sided rib pain after falling down the stairs at her home today. EXAM: RIGHT RIBS AND CHEST - 3+ VIEW COMPARISON:  Prior chest x-ray 09/18/2013 FINDINGS: The lungs are clear and negative for focal airspace consolidation, pulmonary edema or suspicious pulmonary nodule. No pleural effusion or pneumothorax. Cardiac and mediastinal contours are within normal limits. No acute fracture or lytic or blastic osseous lesions. The visualized upper abdominal bowel gas pattern is unremarkable. Surgical  clips in the right upper quadrant suggest prior cholecystectomy. IMPRESSION: Negative. Electronically Signed   By: Jacqulynn Cadet M.D.   On: 07/27/2017 18:19    Procedures Procedures (including critical care time)  Medications Ordered in ED Medications  acetaminophen (TYLENOL) tablet 650 mg (650 mg Oral Given 07/27/17 1716)     Initial Impression / Assessment and Plan / ED Course  I have reviewed the triage vital signs and the nursing notes.  Pertinent labs & imaging results that were available during my care of the patient were reviewed by me and considered in my medical decision making (see chart for details).    This is a 60 y.o. female who presents to the Emergency Department complaining of gradually worsening, persistent mid-lower right-sided back pain that began s/p a mechanical fall that occurred an hour ago. The pt states that she slipped on wooden wet steps and fell down 3 stairs landing on her back and tailbone. The pt denies LOC, but states that the stairs lightly tapped the back of her head. The pt is able to ambulate normally. Movement and breathing deeply exacerbates her pain. She denies SOB.  On exam the patient is afebrile nontoxic appearing. Just mild tenderness over her right lateral chest wall. No overlying skin changes. Symmetric chest expansion bilaterally. No other visible or palpated signs of any trauma.  X-ray of her right ribs and chest were obtained. This was unremarkable. At reevaluation patient is feeling better. She is no shortness of breath. No other complaints. We'll discharge with follow-up by primary care. Return precautions discussed. I advised the patient to follow-up with their primary care provider this week. I advised the patient to return to the emergency department with new or worsening symptoms or new concerns. The patient verbalized understanding and agreement with plan.       Final Clinical Impressions(s) / ED Diagnoses   Final diagnoses:  Fall, initial encounter  Rib contusion, right, initial encounter    New Prescriptions New Prescriptions   ACETAMINOPHEN (TYLENOL) 500 MG TABLET    Take 1 tablet (500 mg total) by mouth every 6 (six) hours as needed for mild pain or moderate pain.   I personally performed the services described in this documentation, which was scribed in my presence. The recorded information has been reviewed and is accurate.       Waynetta Pean, PA-C 07/27/17 1905    Macarthur Critchley, MD 07/27/17 2350

## 2017-07-27 NOTE — ED Triage Notes (Signed)
Patient reports slipping on wet steps today and fell down about 3 stairs. Patient reporting right sided mid to lower back pain. Patient adds that pain is worse with movement.

## 2017-07-31 MED FILL — !LEVEMIR 100 UNITS/ML VIAL: 100/ML | 42 days supply | Qty: 10 | Fill #1

## 2017-07-31 MED FILL — ?GLIMEPIRIDE 4 MG TABLET: 4 | 30 days supply | Qty: 60 | Fill #3

## 2017-08-03 ENCOUNTER — Ambulatory Visit: Payer: Self-pay | Attending: Internal Medicine | Admitting: Pharmacist

## 2017-08-03 DIAGNOSIS — Z7984 Long term (current) use of oral hypoglycemic drugs: Secondary | ICD-10-CM | POA: Insufficient documentation

## 2017-08-03 DIAGNOSIS — Z048 Encounter for examination and observation for other specified reasons: Secondary | ICD-10-CM | POA: Insufficient documentation

## 2017-08-03 DIAGNOSIS — E1165 Type 2 diabetes mellitus with hyperglycemia: Secondary | ICD-10-CM | POA: Insufficient documentation

## 2017-08-03 MED ORDER — INSULIN DETEMIR 100 UNIT/ML ~~LOC~~ SOLN: 35.0000 [IU] | Freq: Every day | SUBCUTANEOUS | 11 refills | Status: DC

## 2017-08-03 NOTE — Progress Notes (Signed)
    S:     No chief complaint on file.   Patient arrives in good spirits.  Presents for diabetes evaluation, education, and management at the request of Dr. Adrian Blackwater. Patient was referred on 07/04/17.  Patient was last seen by Primary Care Provider on 07/04/17.   Patient reports adherence with medications.  Current diabetes medications include: Januvia 100 mg daily, glimepiride 8 mg daily, and Levemir 30 units daily (self titrated up as directed)  Patient denies hypoglycemic events.  Patient reported dietary habits: high carb diet  Patient reported exercise habits: none   Patient reports nocturia.  Patient denies neuropathy. Patient denies visual changes. Patient reports self foot exams.   Patient reports that she went to the ED last week after falling and bruising her rib. She is still in pain and does not feel like the celecoxib is working. She was prescribed acetaminophen and is not sure it is helping.  O:  Physical Exam   ROS   Lab Results  Component Value Date   HGBA1C 10.3 07/04/2017   There were no vitals filed for this visit.  Home fasting CBG: 110s-190s, rare 200s  2 hour post-prandial/random CBG: 190s-300s  A/P: Diabetes longstanding currently uncontrolled based on A1c of 10.3. Patient denies hypoglycemic events and is able to verbalize appropriate hypoglycemia management plan. Patient reports adherence with medication. Control is suboptimal due to dietary indiscretion and sedentary lifestyle.  Increase Levemir to 35 units. Continue other medications. Patient provided with same self titration instructions provided by Dr. Adrian Blackwater to continue to titrate to blood sugar goal fasting <130.  Patient to continue to take prescribed medications for pain from fall. Instructed her not to take OTC NSAIDs in conjunction with celecoxib. She will follow up with Dr. Wynetta Emery or walk in provider in 1 week if pain has not improved. Patient verbalized understanding.   Next A1C  anticipated October 2018.    Written patient instructions provided.  Total time in face to face counseling 15 minutes.   Follow up in Pharmacist Clinic Visit in 2 weeks for CBG review.   Patient seen with Waverly Ferrari, PharmD Candidate

## 2017-08-03 NOTE — Patient Instructions (Addendum)
Thanks for coming to see me  Increase Levemir to 35 units.   If fasting sugar is over 130 increase levemir by 2 Units the following night Write down dose to keep track   Come back and see me in 2 weeks for blood sugar log review  Schedule an appointment with Dr. Wynetta Emery if you still have pain after 1 more week.

## 2017-08-08 ENCOUNTER — Ambulatory Visit: Payer: Self-pay | Attending: Internal Medicine | Admitting: Physician Assistant

## 2017-08-08 VITALS — BP 118/72 | HR 93 | Resp 12 | Wt 152.0 lb

## 2017-08-08 DIAGNOSIS — E1165 Type 2 diabetes mellitus with hyperglycemia: Secondary | ICD-10-CM

## 2017-08-08 DIAGNOSIS — S20221D Contusion of right back wall of thorax, subsequent encounter: Secondary | ICD-10-CM

## 2017-08-08 DIAGNOSIS — M62838 Other muscle spasm: Secondary | ICD-10-CM

## 2017-08-08 LAB — GLUCOSE, POCT (MANUAL RESULT ENTRY): POC Glucose: 236 mg/dl — AB (ref 70–99)

## 2017-08-08 MED ORDER — MELOXICAM 15 MG PO TABS: 15.0000 mg | ORAL_TABLET | Freq: Every day | ORAL | 5 refills | Status: DC

## 2017-08-08 MED ORDER — METHOCARBAMOL 500 MG PO TABS: 500.0000 mg | ORAL_TABLET | Freq: Three times a day (TID) | ORAL | 0 refills | Status: DC

## 2017-08-08 MED ORDER — INSULIN GLARGINE 100 UNITS/ML SOLOSTAR PEN: 35.0000 [IU] | PEN_INJECTOR | Freq: Every day | SUBCUTANEOUS | 11 refills | Status: DC

## 2017-08-08 MED FILL — ?ATORVASTATIN 40MG TABLET: 40 | 30 days supply | Qty: 30 | Fill #6

## 2017-08-08 MED FILL — ?CETIRIZINE HCL 10 MG TABLE: 10 | 30 days supply | Qty: 30 | Fill #4

## 2017-08-08 MED FILL — METHOCARBAMOL 500 MG TABS: 500 | 30 days supply | Qty: 90 | Fill #0

## 2017-08-08 MED FILL — ?MELOXICAM 15MG TABLET: 15 | 30 days supply | Qty: 30 | Fill #0

## 2017-08-08 MED FILL — $LANTUS SOLOSTAR 100 UNITS/: 100 | 34 days supply | Qty: 12 | Fill #0

## 2017-08-08 NOTE — Progress Notes (Signed)
F/u diabetes  Switch insulin

## 2017-08-08 NOTE — Progress Notes (Signed)
Patient ID: Bailey [name], female   DOB: [birth year], 61 y.o.   MRN: [id number]   Bailey [name], is a 61 y.o. female  HEN:[id number]  TIR:[id number]  DOB - 04/24/1956  Subjective:  Chief Complaint and HPI: Bailey [name] is a 61 y.o. female here for continue R flank pain/rib pain since fall a few weeks ago.  Still sore at times.  Worse with cough or sudden movement.  She doesn't feel celebrex is working for this pain or for her arthritis.  No radicular s/sx.  Rib films were negative.  MOI was that she fell at home down about 4 steps  ~3 weeks ago.  Also, our pharmacy has been able to get Lantus for her instead of Levemir.  I discussed her diabetes and medication with Theda Sers since she recently titrated Levemir.   ROS:   Constitutional:  No f/c, No night sweats, No unexplained weight loss. EENT:  No vision changes, No blurry vision, No hearing changes. No mouth, throat, or ear problems.  Respiratory: No cough, No SOB Cardiac: No CP, no palpitations GI:  No abd pain, No N/V/D. GU: No Urinary s/sx Musculoskeletal: +R side pain Neuro: No headache, no dizziness, no motor weakness.  Skin: No rash Endocrine:  No polydipsia. No polyuria.  Psych: Denies SI/HI  No problems updated.  ALLERGIES: Allergies  Allergen Reactions  . Metformin And Related Diarrhea  . Shellfish Allergy Anaphylaxis, Hives and Swelling    Lobster, crab (shrimp's ok)  . Ivp Dye [Iodinated Diagnostic Agents] Hives  . Adhesive [Tape] Other (See Comments)    blisters  . Codeine Other (See Comments)    Unknown childhood allergy  . Hydrocodone-Acetaminophen   . Latex Other (See Comments)    blisters  . Penicillins Other (See Comments)    Unknown childhood reaction    PAST MEDICAL HISTORY: Past Medical History:  Diagnosis Date  . Arthritis    RIGHT shoulder, knees  . Cervical radiculopathy at C5   . Dupuytren's contracture of both hands    L > R  . Dyslipidemia   . History of adenomatous polyp  of colon    1997 and 2003    . History of chronic pelvic inflammatory disease   . History of gastric ulcer    2008  . Hypertriglyceridemia   . Right carpal tunnel syndrome   . Type 2 diabetes mellitus, uncontrolled ([place])    last A1c 8.9 on 07-07-2016  . Vitamin D deficiency     MEDICATIONS AT HOME: Prior to Admission medications   Medication Sig Start Date End Date Taking? Authorizing Provider  acetaminophen (TYLENOL) 500 MG tablet Take 1 tablet (500 mg total) by mouth every 6 (six) hours as needed for mild pain or moderate pain. 07/27/17  Yes Waynetta Pean, PA-C  atorvastatin (LIPITOR) 40 MG tablet Take 1 tablet (40 mg total) by mouth daily. 10/21/16  Yes Funches, Josalyn, MD  Blood Glucose Monitoring Suppl (TRUE METRIX METER) DEVI 1 each by Does not apply route 3 (three) times daily. 04/19/17  Yes Funches, Josalyn, MD  cetirizine (ZYRTEC) 10 MG tablet Take 1 tablet (10 mg total) by mouth daily. 04/17/17  Yes Funches, Josalyn, MD  Cholecalciferol (VITAMIN D3) 2000 units TABS Take 2,000 Units by mouth daily. 10/21/16  Yes Funches, Josalyn, MD  glimepiride (AMARYL) 4 MG tablet Take 2 tablets (8 mg total) by mouth daily before breakfast. 04/17/17  Yes Funches, Josalyn, MD  glucose blood (TRUE METRIX BLOOD GLUCOSE TEST) test strip 1 each by  Other route 3 (three) times daily. 04/19/17  Yes Funches, Josalyn, MD  hydrocortisone valerate cream (WESTCORT) 0.2 % Apply 1 application topically 2 (two) times daily. 06/06/17  Yes Funches, Josalyn, MD  Insulin Syringe-Needle U-100 (B-D INS SYRINGE 0.5CC/31GX5/16) 31G X 5/16" 0.5 ML MISC 1 each by Does not apply route at bedtime. 04/17/17  Yes Boykin Nearing, MD  Lancet Devices [facility]) lancets Use as instructed 06/15/16  Yes Funches, Adriana Mccallum, MD  Multiple Vitamins-Minerals (MULTIVITAMIN WITH MINERALS) tablet Take 1 tablet by mouth every other day.    Yes [provider]  sitaGLIPtin (JANUVIA) 100 MG tablet Take 1 tablet (100 mg total) by  mouth daily. 04/05/17  Yes Funches, Josalyn, MD  TRUEPLUS LANCETS 28G MISC 1 each by Does not apply route 3 (three) times daily. 04/19/17  Yes Funches, Adriana Mccallum, MD  VIVELLE-DOT 0.025 MG/24HR PLACE 1 PATCH ONTO THE SKIN 2 TIMES A WEEK. 04/06/17  Yes Woodroe Mode, MD  insulin glargine (LANTUS) 100 unit/mL SOPN Inject 0.35 mLs (35 Units total) into the skin at bedtime. 08/08/17   Argentina Donovan, PA-C  meloxicam (MOBIC) 15 MG tablet Take 1 tablet (15 mg total) by mouth daily. 08/08/17   Argentina Donovan, PA-C  methocarbamol (ROBAXIN) 500 MG tablet Take 1 tablet (500 mg total) by mouth 3 (three) times daily. X 7-10 days then prn muscle spasm 08/08/17   Argentina Donovan, PA-C     Objective:  EXAM:   Vitals:   08/08/17 1440  BP: 118/72  Pulse: 93  Resp: 12  SpO2: 96%  Weight: 152 lb (68.9 kg)    General appearance : A&OX3. NAD. Non-toxic-appearing HEENT: Atraumatic and Normocephalic.  PERRLA. EOM intact.  TM clear B. Mouth-MMM, post pharynx WNL w/o erythema, No PND. Neck: supple, no JVD. No cervical lymphadenopathy. No thyromegaly Chest/Lungs:  Breathing-non-labored, Good air entry bilaterally, breath sounds normal without rales, rhonchi, or wheezing  CVS: S1 S2 regular, no murmurs, gallops, rubs  Thoracolumbar spine-full S&ROM.  There is TTP and paraspinus spasm on the R>L. Neg SLR Extremities: Bilateral Lower Ext shows no edema, both legs are warm to touch with = pulse throughout Neurology:  CN II-XII grossly intact, Non focal.   Psych:  TP linear. J/I WNL. Normal speech. Appropriate eye contact and affect.  Skin:  No Rash  Data Review Lab Results  Component Value Date   HGBA1C 10.3 07/04/2017   HGBA1C 10.3 04/17/2017   HGBA1C 8.0 10/21/2016     Assessment & Plan   1. Uncontrolled type 2 diabetes mellitus with hyperglycemia, without long-term current use of insulin ([place]) Uncontrolled-lantus now available.  Will stop Levemir - Glucose (CBG) - insulin glargine (LANTUS) 100  unit/mL SOPN; Inject 0.35 mLs (35 Units total) into the skin at bedtime.  Dispense: 15 mL; Refill: 11 Keep f/up for titration with Theda Sers as scheduled.    2. Contusion of right side of back, subsequent encounter No red flags today Stop celebrex since ineffective - meloxicam (MOBIC) 15 MG tablet; Take 1 tablet (15 mg total) by mouth daily.  Dispense: 30 tablet; Refill: 5  3. Muscle spasm - methocarbamol (ROBAXIN) 500 MG tablet; Take 1 tablet (500 mg total) by mouth 3 (three) times daily. X 7-10 days then prn muscle spasm  Dispense: 90 tablet; Refill: 0  Patient have been counseled extensively about nutrition and exercise  Return for keep f/up with Theda Sers and needs to be assigned to a new PCP.  The patient was given clear instructions  to go to ER or return to medical center if symptoms don't improve, worsen or new problems develop. The patient verbalized understanding. The patient was told to call to get lab results if they haven't heard anything in the next week.     Freeman Caldron, PA-C [facility] [place], [place]   08/08/2017, 3:09 PM

## 2017-08-23 ENCOUNTER — Ambulatory Visit: Payer: Self-pay | Attending: Internal Medicine | Admitting: Pharmacist

## 2017-08-23 ENCOUNTER — Other Ambulatory Visit: Payer: Self-pay | Admitting: Physician Assistant

## 2017-08-23 DIAGNOSIS — E1165 Type 2 diabetes mellitus with hyperglycemia: Secondary | ICD-10-CM

## 2017-08-23 DIAGNOSIS — Z794 Long term (current) use of insulin: Secondary | ICD-10-CM | POA: Insufficient documentation

## 2017-08-23 DIAGNOSIS — E119 Type 2 diabetes mellitus without complications: Secondary | ICD-10-CM | POA: Insufficient documentation

## 2017-08-23 MED ORDER — "INSULIN SYRINGE-NEEDLE U-100 31G X 5/16"" 0.5 ML MISC": 1.0000 | Freq: Every day | 5 refills | Status: DC

## 2017-08-23 MED FILL — TRUEPLUS SYR 0.5ML 31GX5/16: 31G X 5/16" | 30 days supply | Qty: 100 | Fill #0

## 2017-08-23 NOTE — Patient Instructions (Addendum)
Thanks for coming to see [place]  Continue current medications  Follow up with new primary care provider in October for A1c  Come back and see me if you start to have low blood sugars <80 or if you see that your blood sugars are starting to go back up >200   Hypoglycemia Hypoglycemia is when the sugar (glucose) level in the blood is too low. Symptoms of low blood sugar may include:  Feeling: ? Hungry. ? Worried or nervous (anxious). ? Sweaty and clammy. ? Confused. ? Dizzy. ? Sleepy. ? Sick to your stomach (nauseous).  Having: ? A fast heartbeat. ? A headache. ? A change in your vision. ? Jerky movements that you cannot control (seizure). ? Nightmares. ? Tingling or no feeling (numbness) around the mouth, lips, or tongue.  Having trouble with: ? Talking. ? Paying attention (concentrating). ? Moving (coordination). ? Sleeping.  Shaking.  Passing out (fainting).  Getting upset easily (irritability).  Low blood sugar can happen to people who have diabetes and people who do not have diabetes. Low blood sugar can happen quickly, and it can be an emergency. Treating Low Blood Sugar Low blood sugar is often treated by eating or drinking something sugary right away. If you can think clearly and swallow safely, follow the 15:15 rule:  Take 15 grams of a fast-acting carb (carbohydrate). Some fast-acting carbs are: ? 1 tube of glucose gel. ? 3 sugar tablets (glucose pills). ? 6-8 pieces of hard candy. ? 4 oz (120 mL) of fruit juice. ? 4 oz (120 mL) of regular (not diet) soda.  Check your blood sugar 15 minutes after you take the carb.  If your blood sugar is still at or below 70 mg/dL (3.9 mmol/L), take 15 grams of a carb again.  If your blood sugar does not go above 70 mg/dL (3.9 mmol/L) after 3 tries, get help right away.  After your blood sugar goes back to normal, eat a meal or a snack within 1 hour.  Treating Very Low Blood Sugar If your blood sugar is at or below 54  mg/dL (3 mmol/L), you have very low blood sugar (severe hypoglycemia). This is an emergency. Do not wait to see if the symptoms will go away. Get medical help right away. Call your local emergency services (911 in the [place] Do not drive yourself to the hospital. If you have very low blood sugar and you cannot eat or drink, you may need a glucagon shot (injection). A family member or friend should learn how to check your blood sugar and how to give you a glucagon shot. Ask your doctor if you need to have a glucagon shot kit at home. Follow these instructions at home: General instructions  Avoid any diets that cause you to not eat enough food. Talk with your doctor before you start any new diet.  Take over-the-counter and prescription medicines only as told by your doctor.  Limit alcohol to no more than 1 drink per day for nonpregnant women and 2 drinks per day for men. One drink equals 12 oz of beer, 5 oz of wine, or 1 oz of hard liquor.  Keep all follow-up visits as told by your doctor. This is important. If You Have Diabetes:   Make sure you know the symptoms of low blood sugar.  Always keep a source of sugar with you, such as: ? Sugar. ? Sugar tablets. ? Glucose gel. ? Fruit juice. ? Regular soda (not diet soda). ? Milk. ?  Hard candy. ? Honey.  Take your medicines as told.  Follow your exercise and meal plan. ? Eat on time. Do not skip meals. ? Follow your sick day plan when you cannot eat or drink normally. Make this plan ahead of time with your doctor.  Check your blood sugar as often as told by your doctor. Always check before and after exercise.  Share your diabetes care plan with: ? Your work or school. ? People you live with.  Check your pee (urine) for ketones: ? When you are sick. ? As told by your doctor.  Carry a card or wear jewelry that says you have diabetes. If You Have Low Blood Sugar From Other Causes:   Check your blood sugar as often as told by  your doctor.  Follow instructions from your doctor about what you cannot eat or drink. Contact a doctor if:  You have trouble keeping your blood sugar in your target range.  You have low blood sugar often. Get help right away if:  You still have symptoms after you eat or drink something sugary.  Your blood sugar is at or below 54 mg/dL (3 mmol/L).  You have jerky movements that you cannot control.  You pass out. These symptoms may be an emergency. Do not wait to see if the symptoms will go away. Get medical help right away. Call your local emergency services (911 in the [place] Do not drive yourself to the hospital. This information is not intended to replace advice given to you by your health care provider. Make sure you discuss any questions you have with your health care provider. Document Released: 03/08/2010 Document Revised: 05/19/2016 Document Reviewed: 01/15/2016 Elsevier Interactive Patient Education  [employer].

## 2017-08-23 NOTE — Progress Notes (Signed)
    S:     No chief complaint on file.   Patient arrives in good spirits.  Presents for diabetes evaluation, education, and management at the request of Dr. Adrian Blackwater. Patient was referred on 07/04/17.  Patient was last seen by Primary Care Provider on 07/04/17.   Patient reports adherence with medications.  Current diabetes medications include: Januvia 100 mg daily, glimepiride 8 mg daily, and Lantus 35 (switched from Levemir by Freeman Caldron, PA at last visit)  Patient denies hypoglycemic events.  Patient reported dietary habits: high carb diet  Patient reported exercise habits: none   Patient reports nocturia.  Patient denies neuropathy. Patient denies visual changes. Patient reports self foot exams.   Patient reports tingling in her right leg that she really started to notice last night. She does report that when she fell the other week that she hit her back. She denies leg weakness, discoloration, swelling, incontinence.  O:  Physical Exam   ROS   Lab Results  Component Value Date   HGBA1C 10.3 07/04/2017   There were no vitals filed for this visit.  Home fasting CBG: 80s-150s, average about 120s 2 hour post-prandial/random CBG: 130s-190s, 216  A/P: Diabetes longstanding currently uncontrolled based on A1c of 10.3. Patient denies hypoglycemic events and is able to verbalize appropriate hypoglycemia management plan. Patient reports adherence with medication. Control is suboptimal due to dietary indiscretion and sedentary lifestyle.  Continue current medications as prescribed - she has a few readings outside of her range but over the last week most readings are at goal and titrating any medications could result in hypoglycemia.. Patient to return to see me if she has hypoglycemia or if she sees her blood sugars start to go back up consistently >200. Patient will return to clinic if leg pain worsens or doesn't resolve in a few days. Information given on s/sx that constitute  an emergency for which she would need to go to the emergency room and not here.  Next A1C anticipated October 2018.    Written patient instructions provided.  Total time in face to face counseling 15 minutes.   Follow up in Pharmacist Clinic Visit in 2 weeks for CBG review.   Patient seen with Waverly Ferrari, PharmD Candidate

## 2017-08-25 ENCOUNTER — Other Ambulatory Visit: Payer: Self-pay | Admitting: *Deleted

## 2017-08-25 MED ORDER — INSULIN PEN NEEDLE 29G X 12.7MM MISC: 1.0000 | Freq: Three times a day (TID) | 12 refills | Status: DC

## 2017-08-25 MED FILL — TRUEPLUS PEN NDL 32GX5/32": 32GX 5/32" | 33 days supply | Qty: 100 | Fill #0

## 2017-08-25 MED FILL — TRUEPLUS PEN NDL 32GX5/32: 32GX 5/32" | 33 days supply | Qty: 100 | Fill #0

## 2017-08-29 ENCOUNTER — Other Ambulatory Visit: Payer: Self-pay | Admitting: Internal Medicine

## 2017-08-29 MED ORDER — GLUCOSE BLOOD VI STRP: ORAL_STRIP | 12 refills | Status: DC

## 2017-08-29 MED FILL — ?GLIMEPIRIDE 4 MG TABLET: 4 | 30 days supply | Qty: 60 | Fill #4

## 2017-08-30 MED FILL — TRUE METRIX TEST STRIP: 30 days supply | Qty: 100 | Fill #0

## 2017-09-07 MED FILL — $LANTUS SOLOSTAR 100 UNITS/: 100 | 34 days supply | Qty: 12 | Fill #1

## 2017-09-07 MED FILL — ESTRADIOL 0.025 MG PATCH: 0.025 | 30 days supply | Qty: 8 | Fill #3

## 2017-09-14 ENCOUNTER — Encounter: Payer: Self-pay | Admitting: Physician Assistant

## 2017-09-14 ENCOUNTER — Ambulatory Visit: Payer: Self-pay | Attending: Internal Medicine | Admitting: Physician Assistant

## 2017-09-14 VITALS — BP 125/70 | HR 75 | Temp 98.1°F | Resp 18 | Ht 62.0 in | Wt 157.2 lb

## 2017-09-14 DIAGNOSIS — Z794 Long term (current) use of insulin: Secondary | ICD-10-CM | POA: Insufficient documentation

## 2017-09-14 DIAGNOSIS — R29898 Other symptoms and signs involving the musculoskeletal system: Secondary | ICD-10-CM

## 2017-09-14 DIAGNOSIS — M720 Palmar fascial fibromatosis [Dupuytren]: Secondary | ICD-10-CM | POA: Insufficient documentation

## 2017-09-14 DIAGNOSIS — E1165 Type 2 diabetes mellitus with hyperglycemia: Secondary | ICD-10-CM | POA: Insufficient documentation

## 2017-09-14 DIAGNOSIS — G8929 Other chronic pain: Secondary | ICD-10-CM | POA: Insufficient documentation

## 2017-09-14 DIAGNOSIS — E559 Vitamin D deficiency, unspecified: Secondary | ICD-10-CM | POA: Insufficient documentation

## 2017-09-14 DIAGNOSIS — R531 Weakness: Secondary | ICD-10-CM | POA: Insufficient documentation

## 2017-09-14 DIAGNOSIS — M5412 Radiculopathy, cervical region: Secondary | ICD-10-CM | POA: Insufficient documentation

## 2017-09-14 DIAGNOSIS — Z79899 Other long term (current) drug therapy: Secondary | ICD-10-CM | POA: Insufficient documentation

## 2017-09-14 DIAGNOSIS — M5441 Lumbago with sciatica, right side: Secondary | ICD-10-CM | POA: Insufficient documentation

## 2017-09-14 DIAGNOSIS — E781 Pure hyperglyceridemia: Secondary | ICD-10-CM | POA: Insufficient documentation

## 2017-09-14 DIAGNOSIS — Z8719 Personal history of other diseases of the digestive system: Secondary | ICD-10-CM | POA: Insufficient documentation

## 2017-09-14 LAB — POCT URINALYSIS DIPSTICK
Bilirubin, UA: NEGATIVE
Glucose, UA: NEGATIVE
Ketones, UA: NEGATIVE
Leukocytes, UA: NEGATIVE
NITRITE UA: NEGATIVE
PH UA: 5 (ref 5.0–8.0)
PROTEIN UA: NEGATIVE
RBC UA: NEGATIVE
Spec Grav, UA: 1.025 (ref 1.010–1.025)
UROBILINOGEN UA: 0.2 U/dL

## 2017-09-14 LAB — GLUCOSE, POCT (MANUAL RESULT ENTRY): POC GLUCOSE: 196 mg/dL — AB (ref 70–99)

## 2017-09-14 NOTE — Progress Notes (Signed)
Patient ID: GABRYELA KIMBRELL, female   DOB: [birth year], 61 y.o.   MRN: [id number]   Joellen Tullos, is a 61 y.o. female  SEG:[id number]  VPX:[id number]  DOB - Apr 03, 1956  Subjective:  Chief Complaint and HPI: Cindy Fullman is a 61 y.o. female here today for about 3-4 week h/o worsening LBP, R hip pain, and R lower leg weakness and paresthesias.  There have been 4-5 episodes over the last ~10 days where her R leg felt as though "it was going to go out up under her."  She is also having RLE paresthesias.  Some relief of pain with meloxicam and methocarbamol.  Also of note, I had seen her 08/08/2017 a few weeks after she sustained a fall in July.  At that time, there were no radicular symptoms.    Compliant on diabetes meds.  Blood sugars at home usu ~120 fasting; and under 200 post prandial.  Denies s/sx of hyper/hypoglycemia.  ROS:   Constitutional:  No f/c, No night sweats, No unexplained weight loss. EENT:  No vision changes, No blurry vision, No hearing changes. No mouth, throat, or ear problems.  Respiratory: No cough, No SOB Cardiac: No CP, no palpitations GI:  No abd pain, No N/V/D. GU: No Urinary s/sx Musculoskeletal: R leg, LBP, and R hip pain Neuro: No headache, no dizziness, no motor weakness.  Skin: No rash Endocrine:  No polydipsia. No polyuria.  Psych: Denies SI/HI  No problems updated.  ALLERGIES: Allergies  Allergen Reactions  . Metformin And Related Diarrhea  . Shellfish Allergy Anaphylaxis, Hives and Swelling    Lobster, crab (shrimp's ok)  . Ivp Dye [Iodinated Diagnostic Agents] Hives  . Adhesive [Tape] Other (See Comments)    blisters  . Codeine Other (See Comments)    Unknown childhood allergy  . Hydrocodone-Acetaminophen   . Latex Other (See Comments)    blisters  . Penicillins Other (See Comments)    Unknown childhood reaction    PAST MEDICAL HISTORY: Past Medical History:  Diagnosis Date  . Arthritis    RIGHT shoulder, knees  . Cervical  radiculopathy at C5   . Dupuytren's contracture of both hands    L > R  . Dyslipidemia   . History of adenomatous polyp of colon    1997 and 2003    . History of chronic pelvic inflammatory disease   . History of gastric ulcer    2008  . Hypertriglyceridemia   . Right carpal tunnel syndrome   . Type 2 diabetes mellitus, uncontrolled ([place])    last A1c 8.9 on 07-07-2016  . Vitamin D deficiency     MEDICATIONS AT HOME: Prior to Admission medications   Medication Sig Start Date End Date Taking? Authorizing Provider  acetaminophen (TYLENOL) 500 MG tablet Take 1 tablet (500 mg total) by mouth every 6 (six) hours as needed for mild pain or moderate pain. 07/27/17   Waynetta Pean, PA-C  atorvastatin (LIPITOR) 40 MG tablet Take 1 tablet (40 mg total) by mouth daily. 10/21/16   Funches, Adriana Mccallum, MD  Blood Glucose Monitoring Suppl (TRUE METRIX METER) DEVI 1 each by Does not apply route 3 (three) times daily. 04/19/17   Funches, Adriana Mccallum, MD  cetirizine (ZYRTEC) 10 MG tablet Take 1 tablet (10 mg total) by mouth daily. 04/17/17   Funches, Adriana Mccallum, MD  Cholecalciferol (VITAMIN D3) 2000 units TABS Take 2,000 Units by mouth daily. 10/21/16   Funches, Adriana Mccallum, MD  glimepiride (AMARYL) 4 MG tablet Take 2 tablets (8 mg total)  by mouth daily before breakfast. 04/17/17   Boykin Nearing, MD  glucose blood (TRUE METRIX BLOOD GLUCOSE TEST) test strip 1 each by Other route 3 (three) times daily. 04/19/17   Funches, Adriana Mccallum, MD  glucose blood (TRUE METRIX BLOOD GLUCOSE TEST) test strip Use as instructed 08/29/17   Ladell Pier, MD  hydrocortisone valerate cream (WESTCORT) 0.2 % Apply 1 application topically 2 (two) times daily. 06/06/17   Funches, Adriana Mccallum, MD  insulin glargine (LANTUS) 100 unit/mL SOPN Inject 0.35 mLs (35 Units total) into the skin at bedtime. 08/08/17   Argentina Donovan, PA-C  Insulin Pen Needle (BD ULTRA-FINE PEN NEEDLES) 29G X 12.7MM MISC 1 each by Does not apply route 3 (three) times daily.  08/25/17   Argentina Donovan, PA-C  Insulin Syringe-Needle U-100 (B-D INS SYRINGE 0.5CC/31GX5/16) 31G X 5/16" 0.5 ML MISC 1 each by Does not apply route at bedtime. 08/23/17   Argentina Donovan, PA-C  Lancet Devices (ACCU-CHEK [place]) lancets Use as instructed 06/15/16   Boykin Nearing, MD  meloxicam (MOBIC) 15 MG tablet Take 1 tablet (15 mg total) by mouth daily. 08/08/17   Argentina Donovan, PA-C  methocarbamol (ROBAXIN) 500 MG tablet Take 1 tablet (500 mg total) by mouth 3 (three) times daily. X 7-10 days then prn muscle spasm 08/08/17   Freeman Caldron M, PA-C  Multiple Vitamins-Minerals (MULTIVITAMIN WITH MINERALS) tablet Take 1 tablet by mouth every other day.     [provider]  sitaGLIPtin (JANUVIA) 100 MG tablet Take 1 tablet (100 mg total) by mouth daily. 04/05/17   Funches, Adriana Mccallum, MD  TRUEPLUS LANCETS 28G MISC 1 each by Does not apply route 3 (three) times daily. 04/19/17   Boykin Nearing, MD  VIVELLE-DOT 0.025 MG/24HR PLACE 1 PATCH ONTO THE SKIN 2 TIMES A WEEK. 04/06/17   Woodroe Mode, MD     Objective:  EXAM:   Vitals:   09/14/17 0941  BP: 125/70  Pulse: 75  Resp: 18  Temp: 98.1 F (36.7 C)  TempSrc: Oral  SpO2: 97%  Weight: 157 lb 3.2 oz (71.3 kg)  Height: 5\' 2"  (1.575 m)    General appearance : A&OX3. NAD. Non-toxic-appearing, normal gait without assistance HEENT: Atraumatic and Normocephalic.  PERRLA. EOM intact.  Neck: supple, no JVD. No cervical lymphadenopathy. No thyromegaly Chest/Lungs:  Breathing-non-labored, Good air entry bilaterally, breath sounds normal without rales, rhonchi, or wheezing  CVS: S1 S2 regular, no murmurs, gallops, rubs  Back: full S&ROM for age. Neg SLR B.   Extremities: Bilateral Lower Ext shows no edema, both legs are warm to touch with = pulse throughout.  Calf size = w/o TTP, neg homan's B.  R leg mildly weaker than L.  Neurology:  CN II-XII grossly intact, Non focal.  Heel to toe, tip toe/heel gait WNL.  No babinski Psych:   TP linear. J/I WNL. Normal speech. Appropriate eye contact and affect.  Skin:  No Rash  Data Review Lab Results  Component Value Date   HGBA1C 10.3 07/04/2017   HGBA1C 10.3 04/17/2017   HGBA1C 8.0 10/21/2016     Assessment & Plan   1. Uncontrolled type 2 diabetes mellitus with hyperglycemia, without long-term current use of insulin (HCC) Uncontrolled-no change in regimen bc improving on recently changed regimen.  Reviewed [facility] last visit note - Glucose (CBG) - Urinalysis Dipstick  2. Chronic right-sided low back pain with right-sided sciatica and mild R leg weakness>L - MR Lumbar Spine Wo Contrast; Future Use meloxicam  and methocarbamol for pain  3. Right leg weakness - MR Lumbar Spine Wo Contrast; Future   Patient have been counseled extensively about nutrition and exercise  Return in about 3 weeks (around 10/05/2017) for keep appt with Dr Wynetta Emery.  The patient was given clear instructions to go to ER or return to medical center if symptoms don't improve, worsen or new problems develop. The patient verbalized understanding. The patient was told to call to get lab results if they haven't heard anything in the next week.     Freeman Caldron, PA-C [facility], [place]   09/14/2017, 9:56 AM

## 2017-09-18 MED FILL — ?CETIRIZINE HCL 10 MG TABLE: 10 | 30 days supply | Qty: 30 | Fill #5

## 2017-09-18 MED FILL — ?MELOXICAM 15MG TABLET: 15 | 30 days supply | Qty: 30 | Fill #1

## 2017-09-18 MED FILL — ?ATORVASTATIN 40MG TABLET: 40 | 30 days supply | Qty: 30 | Fill #7

## 2017-09-19 ENCOUNTER — Other Ambulatory Visit: Payer: Self-pay | Admitting: Physician Assistant

## 2017-09-19 DIAGNOSIS — M62838 Other muscle spasm: Secondary | ICD-10-CM

## 2017-09-19 MED FILL — !CELEBREX 100 MG CAPSULE: 100 MG | 30 days supply | Qty: 60 | Fill #1

## 2017-09-19 MED FILL — METHOCARBAMOL 500 MG TABLET: 500 | 10 days supply | Qty: 30 | Fill #0

## 2017-09-19 MED FILL — TRUE METRIX TEST STRIP: 15 days supply | Qty: 50 | Fill #1

## 2017-09-20 ENCOUNTER — Other Ambulatory Visit: Payer: Self-pay | Admitting: Physician Assistant

## 2017-09-20 ENCOUNTER — Ambulatory Visit (HOSPITAL_COMMUNITY)
Admission: RE | Admit: 2017-09-20 | Discharge: 2017-09-20 | Disposition: A | Discharge status: Home / Self Care | Payer: Self-pay | Source: Ambulatory Visit | Attending: Physician Assistant | Admitting: Physician Assistant

## 2017-09-20 DIAGNOSIS — E1165 Type 2 diabetes mellitus with hyperglycemia: Secondary | ICD-10-CM

## 2017-09-20 DIAGNOSIS — G8929 Other chronic pain: Secondary | ICD-10-CM

## 2017-09-20 DIAGNOSIS — M5136 Other intervertebral disc degeneration, lumbar region: Secondary | ICD-10-CM | POA: Insufficient documentation

## 2017-09-20 DIAGNOSIS — R9389 Abnormal findings on diagnostic imaging of other specified body structures: Secondary | ICD-10-CM

## 2017-09-20 DIAGNOSIS — M5441 Lumbago with sciatica, right side: Secondary | ICD-10-CM

## 2017-09-20 DIAGNOSIS — M48061 Spinal stenosis, lumbar region without neurogenic claudication: Secondary | ICD-10-CM | POA: Insufficient documentation

## 2017-09-20 DIAGNOSIS — Z981 Arthrodesis status: Secondary | ICD-10-CM | POA: Insufficient documentation

## 2017-09-21 ENCOUNTER — Telehealth: Payer: Self-pay | Admitting: *Deleted

## 2017-09-21 NOTE — Telephone Encounter (Signed)
-----   Message from [place] Donovan, [place] sent at 09/20/2017  3:12 PM EDT ----- Please call aptient.  Her MRI shows there may be some compression of a nerve on the R side in her lower back.  I am referring her to a neurosurgeon to see what treatment options are.  The MRI also shows some disc narrowing and arthritis.    Thanks, Freeman Caldron, PA-C

## 2017-09-21 NOTE — Telephone Encounter (Signed)
Patient is aware of MRI showing arthritis and narrowing of a disc, patient is also aware of compression of a nerve on the right side being noted. Patient has been referred to neurosurgery and will receive a phone call within the next 3 weeks with initial appointment details. Medical Assistant left message on patient's home and cell voicemail. Voicemail states to give a call back to [place] with [facility] at [phone].

## 2017-09-27 MED FILL — ?GLIMEPIRIDE 4 MG TABLET: 4 | 30 days supply | Qty: 60 | Fill #5

## 2017-09-27 MED FILL — TRUEPLUS PEN NDL 32GX5/32: 32GX 5/32" | 33 days supply | Qty: 100 | Fill #1

## 2017-09-27 MED FILL — TRUEPLUS PEN NDL 32GX5/32": 32GX 5/32" | 33 days supply | Qty: 100 | Fill #1

## 2017-10-05 ENCOUNTER — Ambulatory Visit: Payer: Self-pay | Attending: Internal Medicine | Admitting: Internal Medicine

## 2017-10-05 ENCOUNTER — Encounter: Payer: Self-pay | Admitting: Internal Medicine

## 2017-10-05 VITALS — BP 130/78 | HR 72 | Temp 98.4°F | Resp 16 | Wt 154.4 lb

## 2017-10-05 DIAGNOSIS — E559 Vitamin D deficiency, unspecified: Secondary | ICD-10-CM | POA: Insufficient documentation

## 2017-10-05 DIAGNOSIS — Z800 Family history of malignant neoplasm of digestive organs: Secondary | ICD-10-CM

## 2017-10-05 DIAGNOSIS — Z794 Long term (current) use of insulin: Secondary | ICD-10-CM | POA: Insufficient documentation

## 2017-10-05 DIAGNOSIS — E785 Hyperlipidemia, unspecified: Secondary | ICD-10-CM

## 2017-10-05 DIAGNOSIS — Z79899 Other long term (current) drug therapy: Secondary | ICD-10-CM | POA: Insufficient documentation

## 2017-10-05 DIAGNOSIS — M48062 Spinal stenosis, lumbar region with neurogenic claudication: Secondary | ICD-10-CM

## 2017-10-05 DIAGNOSIS — Z91013 Allergy to seafood: Secondary | ICD-10-CM | POA: Insufficient documentation

## 2017-10-05 DIAGNOSIS — Z8719 Personal history of other diseases of the digestive system: Secondary | ICD-10-CM | POA: Insufficient documentation

## 2017-10-05 DIAGNOSIS — F17200 Nicotine dependence, unspecified, uncomplicated: Secondary | ICD-10-CM | POA: Insufficient documentation

## 2017-10-05 DIAGNOSIS — F17210 Nicotine dependence, cigarettes, uncomplicated: Secondary | ICD-10-CM | POA: Insufficient documentation

## 2017-10-05 DIAGNOSIS — Z8711 Personal history of peptic ulcer disease: Secondary | ICD-10-CM | POA: Insufficient documentation

## 2017-10-05 DIAGNOSIS — M4726 Other spondylosis with radiculopathy, lumbar region: Secondary | ICD-10-CM | POA: Insufficient documentation

## 2017-10-05 DIAGNOSIS — Z880 Allergy status to penicillin: Secondary | ICD-10-CM | POA: Insufficient documentation

## 2017-10-05 DIAGNOSIS — E1165 Type 2 diabetes mellitus with hyperglycemia: Secondary | ICD-10-CM

## 2017-10-05 LAB — POCT GLYCOSYLATED HEMOGLOBIN (HGB A1C): Hemoglobin A1C: 7.8

## 2017-10-05 LAB — GLUCOSE, POCT (MANUAL RESULT ENTRY): POC GLUCOSE: 132 mg/dL — AB (ref 70–99)

## 2017-10-05 NOTE — Progress Notes (Signed)
Patient ID: Bailey [name], female    DOB: [birth year]  MRN: [id number]  CC: re-establish and Diabetes   Subjective: Bailey [name] is a 61 y.o. female who presents for chronic ds management. Her concerns today include:  Pt with hx of DM, HL, OA (RT shoulder and knees), gastric ulcer, Vit D def, tob dep  1. DM: has log. A.m BS 79-146, after lunch 156-240, before dinner most in 200s.  Eating habits: drinking mainly water.  Has to cut back more on starches. She has been eating dinner late depending on whether she is baby sitting or not -exercise: not walking as much as she should -med: compliant with Amaryl, Lantus and Januvia.   -last eye exam was 2 yrs ago. No insurance  2. Tob dep: smoking more after learning a girlfriend has cancer. Smoking less than 1/2 pk a day. Smoked for 20 yrs.  -"I need to stop. I want to." -tried Chantix in past. Caused depression.  -quit for 6 mths once in past by chewing on strews as substitute.   3. Back pain: had mechanical fall  In 07/2017 for which she was seen in the emergency room for right side pain. Rib x-rays were negative. -Subsequently seen by our PA 3 weeks ago for LBP radiating to RT leg associated with RT leg weakness and paresthesias.   -MRI revealed: IMPRESSION: 1. Severe right and mild left facet arthrosis at L5-S1 with mild bilateral neural foraminal stenosis. 2. Mild disc and facet degeneration at L4-5 without stenosis. -pt referred to neurosurgeon  -pain has dec. "Every once and and while it hits me."  When it does, she gets shooting pain in RT leg with weakness in the knees. Not having to take the Mobic and Robaxin daily  4. HL: compliant with statin  HM: due for eye exam. Fhx of colon CA in brother and maternal GM.  Had colonoscopy last yr by Dr. Collene Mares but inadequate prep. Told she have to return this yr   Patient Active Problem List   Diagnosis Date Noted  . Tobacco dependence 10/05/2017  . Spinal stenosis of lumbar region  with neurogenic claudication 10/05/2017  . Osteoarthritis of spine with radiculopathy, lumbar region 10/05/2017  . Plantar wart of right foot 06/01/2017  . Right sided weakness 12/24/2015  . Dyslipidemia with low high density lipoprotein (HDL) cholesterol with hypertriglyceridemia due to type 2 diabetes mellitus ([place]) 11/16/2015  . Uncontrolled type 2 diabetes mellitus with hyperglycemia ([place]) 11/12/2015  . Menopausal symptoms 11/12/2015  . Dupuytren's contracture of both hands 07/05/2013  . Need for Tdap vaccination 07/05/2013  . Arthritis      Current Outpatient Prescriptions on File Prior to Visit  Medication Sig Dispense Refill  . acetaminophen (TYLENOL) 500 MG tablet Take 1 tablet (500 mg total) by mouth every 6 (six) hours as needed for mild pain or moderate pain. 30 tablet 0  . atorvastatin (LIPITOR) 40 MG tablet Take 1 tablet (40 mg total) by mouth daily. 30 tablet 11  . Blood Glucose Monitoring Suppl (TRUE METRIX METER) DEVI 1 each by Does not apply route 3 (three) times daily. 1 Device 0  . cetirizine (ZYRTEC) 10 MG tablet Take 1 tablet (10 mg total) by mouth daily. 30 tablet 11  . Cholecalciferol (VITAMIN D3) 2000 units TABS Take 2,000 Units by mouth daily. 30 tablet 11  . glimepiride (AMARYL) 4 MG tablet Take 2 tablets (8 mg total) by mouth daily before breakfast. 60 tablet 11  . glucose blood (  TRUE METRIX BLOOD GLUCOSE TEST) test strip 1 each by Other route 3 (three) times daily. 100 each 12  . glucose blood (TRUE METRIX BLOOD GLUCOSE TEST) test strip Use as instructed 100 each 12  . hydrocortisone valerate cream (WESTCORT) 0.2 % Apply 1 application topically 2 (two) times daily. 45 g 0  . insulin glargine (LANTUS) 100 unit/mL SOPN Inject 0.35 mLs (35 Units total) into the skin at bedtime. 15 mL 11  . Insulin Pen Needle (BD ULTRA-FINE PEN NEEDLES) 29G X 12.7MM MISC 1 each by Does not apply route 3 (three) times daily. 100 each 12  . Insulin Syringe-Needle U-100 (B-D INS SYRINGE  0.5CC/31GX5/16) 31G X 5/16" 0.5 ML MISC 1 each by Does not apply route at bedtime. 30 each 5  . Lancet Devices (ACCU-CHEK SOFTCLIX) lancets Use as instructed 1 each 0  . meloxicam (MOBIC) 15 MG tablet Take 1 tablet (15 mg total) by mouth daily. 30 tablet 5  . methocarbamol (ROBAXIN) 500 MG tablet Take 1 tablet (500 mg total) by mouth every 8 (eight) hours as needed for muscle spasms. 30 tablet 0  . Multiple Vitamins-Minerals (MULTIVITAMIN WITH MINERALS) tablet Take 1 tablet by mouth every other day.     . sitaGLIPtin (JANUVIA) 100 MG tablet Take 1 tablet (100 mg total) by mouth daily. 90 tablet 3  . TRUEPLUS LANCETS 28G MISC 1 each by Does not apply route 3 (three) times daily. 100 each 11  . VIVELLE-DOT 0.025 MG/24HR PLACE 1 PATCH ONTO THE SKIN 2 TIMES A WEEK. 8 patch 12  . [DISCONTINUED] albuterol (PROVENTIL HFA;VENTOLIN HFA) 108 (90 BASE) MCG/ACT inhaler Inhale 2 puffs into the lungs every 4 (four) hours as needed for wheezing (cough). (Patient not taking: Reported on 08/22/2015) 1 Inhaler 0   No current facility-administered medications on file prior to visit.     Allergies  Allergen Reactions  . Metformin And Related Diarrhea  . Shellfish Allergy Anaphylaxis, Hives and Swelling    Lobster, crab (shrimp's ok)  . Ivp Dye [Iodinated Diagnostic Agents] Hives  . Adhesive [Tape] Other (See Comments)    blisters  . Chantix [Varenicline] Other (See Comments)    depression  . Codeine Other (See Comments)    Unknown childhood allergy  . Hydrocodone-Acetaminophen   . Latex Other (See Comments)    blisters  . Penicillins Other (See Comments)    Unknown childhood reaction    Social History   Social History  . Marital status: Married    Spouse name: Bailey [name]  . Number of children: 0  . Years of education: 15   Occupational History  . pick-pack/order processor [employer]   Social History Main Topics  . Smoking status: Current Every Day Smoker    Packs/day: 0.50     Years: 28.00    Types: Cigarettes  . Smokeless tobacco: Never Used  . Alcohol use No  . Drug use: No  . Sexual activity: Yes    Partners: Male    Birth control/ protection: Surgical   Other Topics Concern  . Not on file   Social History Narrative   Currently living alone, separated from husband.  One story home.  Bailey [name]'s children are adults-2 live in [place], 2 live in [place].     Works for a [employer].     Education: 2 years of college.    Family History  Problem Relation Age of Onset  . Diabetes Mother   . Hypertension Mother   .  Dementia Mother   . Pancreatitis Mother   . Asthma Mother   . COPD Mother   . Stroke Mother   . Hyperlipidemia Mother   . Heart disease Mother   . Colon cancer Mother   . Hypertension Father   . Hypertension Sister   . Diabetes Sister   . Hyperlipidemia Sister   . Hypertension Brother   . Diabetes Brother   . Drug abuse Brother   . Cancer Brother        colon  . Hyperlipidemia Brother   . Hyperlipidemia Brother   . Hypertension Brother   . Colon cancer Brother   . Hyperlipidemia Sister   . Hypertension Sister   . Diabetes Sister   . Diabetes Maternal Grandmother     Past Surgical History:  Procedure Laterality Date  . COLONOSCOPY  last one 07-15-2016  . ESOPHAGOGASTRODUODENOSCOPY  last one 2008  . FASCIECTOMY Left 08/24/2016   Procedure: LEFT HAND PALMER AND SMALL FINGER DIGiTAL FASCIECTOMY AND RELEASE;  Surgeon: Iran Planas, MD;  Location: [facility];  Service: Orthopedics;  Laterality: Left;  . LAPAROSCOPIC CHOLECYSTECTOMY  07/17/2000  . ROTATOR CUFF REPAIR Right 2000  . TOTAL ABDOMINAL HYSTERECTOMY W/ BILATERAL SALPINGOOPHORECTOMY  1998    ROS: Review of Systems  Constitutional: Negative for activity change and appetite change.  Eyes: Negative for visual disturbance.  Respiratory: Negative for chest tightness and shortness of breath.   Cardiovascular: Negative for chest pain and leg swelling.    Gastrointestinal: Negative for blood in stool and constipation.  Genitourinary: Negative for difficulty urinating.  Psychiatric/Behavioral: The patient is not nervous/anxious.     PHYSICAL EXAM: BP 130/78   Pulse 72   Temp 98.4 F (36.9 C) (Oral)   Resp 16   Wt 154 lb 6.4 oz (70 kg)   SpO2 96%   BMI 28.24 kg/m   Wt Readings from Last 3 Encounters:  10/05/17 154 lb 6.4 oz (70 kg)  09/14/17 157 lb 3.2 oz (71.3 kg)  08/08/17 152 lb (68.9 kg)    Physical Exam  General appearance - alert, well appearing, and in no distress Mental status - alert, oriented to person, place, and time, normal mood, behavior, speech, dress, motor activity, and thought processes Nose - normal and patent, no erythema, discharge or polyps Mouth - Dentures above and below  Neck - supple, no significant adenopathy, no carotid bruits Chest - clear to auscultation, no wheezes, rales or rhonchi, symmetric air entry Heart - normal rate, regular rhythm, normal S1, S2, no murmurs, rubs, clicks or gallops Neurological - power in lower extremities 5/5 bilaterally. Mild decrease sensation to gross touch right medial calf. Knee jerk reflexes brisk bilaterally. Plantar reflex downgoing Musculoskeletal - no tenderness on palpation of LS spine Extremities - peripheral pulses normal, no pedal edema, no clubbing or cyanosis Diabetic Foot Exam - Simple   Simple Foot Form Visual Inspection No deformities, no ulcerations, no other skin breakdown bilaterally:  Yes Sensation Testing Intact to touch and monofilament testing bilaterally:  Yes Pulse Check Posterior Tibialis and Dorsalis pulse intact bilaterally:  Yes Comments      Results for orders placed or performed in visit on 10/05/17  POCT glucose (manual entry)  Result Value Ref Range   POC Glucose 132 (A) 70 - 99 mg/dl  POCT glycosylated hemoglobin (Hb A1C)  Result Value Ref Range   Hemoglobin A1C 7.8     ASSESSMENT AND PLAN: 1. Uncontrolled type 2  diabetes mellitus with hyperglycemia ([place]) -  Significantly improved but not at goal. -Patient has set goal to cut back on starches and to start walking again 3 times a week for 15-30 minutes -We will hold off on making changes to medications and see how her blood sugars do with increase physical activity -Encouraged have eye exam. [facility] reasonably price. - POCT glucose (manual entry) - POCT glycosylated hemoglobin (Hb A1C)  2. Tobacco dependence Patient advised to quit smoking. Discussed health risks associated with smoking including lung and other types of cancers, chronic lung diseases and CV risks.. Pt ready ready to give trail of quitting.  Discussed methods to help quit including quitting cold [place], use of NRT, and Bupropion.  She will call [phone] quit now to get free patches -less than 5 mins spent on counselling  3. Hyperlipidemia, unspecified hyperlipidemia type Continue Lipitor  4. Spinal stenosis of lumbar region with neurogenic claudication 5. Osteoarthritis of spine with radiculopathy, lumbar region -Avoid heavy lifting and excessive pushing or pulling. Continue Mobic and muscle relaxant PRN  6. Family hx of colon cancer requiring screening colonoscopy - Ambulatory referral to Gastroenterology  HM: pt declined Tdap today. Will get on f/u visit.   Depression screen [facility] 2/9 10/05/2017 09/14/2017 08/08/2017 07/04/2017 06/01/2017  Decreased Interest 0 0 0 0 0  Down, Depressed, Hopeless 0 0 0 0 0  PHQ - 2 Score 0 0 0 0 0  Altered sleeping 1 1 1 2  0  Tired, decreased energy 0 0 1 1 0  Change in appetite 1 0 1 0 0  Feeling bad or failure about yourself  0 0 0 0 0  Trouble concentrating 0 0 0 0 0  Moving slowly or fidgety/restless 0 0 0 0 0  Suicidal thoughts 0 0 0 0 0  PHQ-9 Score 2 1 3 3  0  Some recent data might be hidden    Patient was given the opportunity to ask questions.  Patient verbalized understanding of the plan and was able to repeat key elements of the plan.     Orders Placed This Encounter  Procedures  . Ambulatory referral to Gastroenterology  . POCT glucose (manual entry)  . POCT glycosylated hemoglobin (Hb A1C)     Requested Prescriptions    No prescriptions requested or ordered in this encounter    Return in about 3 months (around 01/05/2018).  Karle Plumber, MD, FACP

## 2017-10-05 NOTE — Patient Instructions (Signed)
Call [phone] quit now and requests the nicotine patches to help you quit smoking.   Try to exercise at least 3 times a week for 15-30 minutes. I've enclosed some information below about healthy eating habits.   Avoid heavy lifting and excessive pushing or pulling.  Follow a Healthy Eating Plan - You can do it! Limit sugary drinks.  Avoid sodas, sweet tea, sport or energy drinks, or fruit drinks.  Drink water, lo-fat milk, or diet drinks. Limit snack foods.   Cut back on candy, cake, cookies, chips, ice cream.  These are a special treat, only in small amounts. Eat plenty of vegetables.  Especially dark green, red, and orange vegetables. Aim for at least 3 servings a day. More is better! Include fruit in your daily diet.  Whole fruit is much healthier than fruit juice! Limit "white" bread, "white" pasta, "white" rice.   Choose "100% whole grain" products, brown or wild rice. Avoid fatty meats. Try "Meatless Monday" and choose eggs or beans one day a week.  When eating meat, choose lean meats like chicken, [place], and fish.  Grill, broil, or bake meats instead of frying, and eat poultry without the skin. Eat less salt.  Avoid frozen pizzas, frozen dinners and salty foods.  Use seasonings other than salt in cooking.  This can help blood pressure and keep you from swelling Beer, wine and liquor have calories.  If you can safely drink alcohol, limit to 1 drink per day for women, 2 drinks for men

## 2017-10-11 MED FILL — $LANTUS SOLOSTAR 100 UNITS/: 100 | 34 days supply | Qty: 12 | Fill #2

## 2017-10-12 MED FILL — ?MELOXICAM 15MG TABLET: 15 | 30 days supply | Qty: 30 | Fill #2

## 2017-10-12 MED FILL — ?ATORVASTATIN 40MG TABLET: 40 | 30 days supply | Qty: 30 | Fill #8

## 2017-10-12 MED FILL — ESTRADIOL 0.025 MG PATCH: 0.025 | 30 days supply | Qty: 8 | Fill #4

## 2017-10-12 MED FILL — ?CETIRIZINE HCL 10 MG TABLE: 10 | 30 days supply | Qty: 30 | Fill #6

## 2017-10-12 MED FILL — TRUE METRIX TEST STRIP: 15 days supply | Qty: 50 | Fill #2

## 2017-10-12 MED FILL — !CELEBREX 100 MG CAPSULE: 100 MG | 30 days supply | Qty: 60 | Fill #2

## 2017-11-27 MED FILL — ?GLIMEPIRIDE 4 MG TABLET: 4 | 30 days supply | Qty: 60 | Fill #6

## 2017-12-05 ENCOUNTER — Ambulatory Visit: Payer: Self-pay

## 2017-12-08 ENCOUNTER — Other Ambulatory Visit: Payer: Self-pay | Admitting: Pharmacist

## 2017-12-08 DIAGNOSIS — E782 Mixed hyperlipidemia: Principal | ICD-10-CM

## 2017-12-08 DIAGNOSIS — E1169 Type 2 diabetes mellitus with other specified complication: Secondary | ICD-10-CM

## 2017-12-08 MED ORDER — ATORVASTATIN CALCIUM 40 MG PO TABS: 40.0000 mg | ORAL_TABLET | Freq: Every day | ORAL | 2 refills | Status: DC

## 2017-12-08 MED FILL — ?ATORVASTATIN 40MG TABLET: 40 | 30 days supply | Qty: 30 | Fill #0

## 2017-12-21 ENCOUNTER — Encounter: Payer: Self-pay | Admitting: Internal Medicine

## 2017-12-22 MED FILL — $LANTUS SOLOSTAR 100 UNITS/: 100 | 34 days supply | Qty: 12 | Fill #3

## 2017-12-22 MED FILL — ?GLIMEPIRIDE 4 MG TABLET: 4 | 30 days supply | Qty: 60 | Fill #7

## 2017-12-22 MED FILL — TRUE METRIX TEST STRIP: 15 days supply | Qty: 50 | Fill #3

## 2017-12-22 MED FILL — MELOXICAM 15 MG TABLET: 15 | 30 days supply | Qty: 30 | Fill #3

## 2017-12-26 HISTORY — PX: COLONOSCOPY: SHX174

## 2018-01-03 ENCOUNTER — Ambulatory Visit: Payer: Self-pay | Attending: Internal Medicine

## 2018-01-04 ENCOUNTER — Encounter: Payer: Self-pay | Admitting: Internal Medicine

## 2018-01-04 ENCOUNTER — Ambulatory Visit: Payer: Self-pay | Attending: Internal Medicine | Admitting: Internal Medicine

## 2018-01-04 VITALS — BP 124/70 | HR 69 | Temp 98.0°F | Resp 16 | Wt 156.0 lb

## 2018-01-04 DIAGNOSIS — Z91040 Latex allergy status: Secondary | ICD-10-CM | POA: Insufficient documentation

## 2018-01-04 DIAGNOSIS — G8929 Other chronic pain: Secondary | ICD-10-CM | POA: Insufficient documentation

## 2018-01-04 DIAGNOSIS — M542 Cervicalgia: Secondary | ICD-10-CM

## 2018-01-04 DIAGNOSIS — Z23 Encounter for immunization: Secondary | ICD-10-CM

## 2018-01-04 DIAGNOSIS — Z888 Allergy status to other drugs, medicaments and biological substances status: Secondary | ICD-10-CM | POA: Insufficient documentation

## 2018-01-04 DIAGNOSIS — Z833 Family history of diabetes mellitus: Secondary | ICD-10-CM | POA: Insufficient documentation

## 2018-01-04 DIAGNOSIS — M720 Palmar fascial fibromatosis [Dupuytren]: Secondary | ICD-10-CM | POA: Insufficient documentation

## 2018-01-04 DIAGNOSIS — F17200 Nicotine dependence, unspecified, uncomplicated: Secondary | ICD-10-CM

## 2018-01-04 DIAGNOSIS — Z8711 Personal history of peptic ulcer disease: Secondary | ICD-10-CM | POA: Insufficient documentation

## 2018-01-04 DIAGNOSIS — M48062 Spinal stenosis, lumbar region with neurogenic claudication: Secondary | ICD-10-CM | POA: Insufficient documentation

## 2018-01-04 DIAGNOSIS — Z8249 Family history of ischemic heart disease and other diseases of the circulatory system: Secondary | ICD-10-CM | POA: Insufficient documentation

## 2018-01-04 DIAGNOSIS — M25562 Pain in left knee: Secondary | ICD-10-CM | POA: Insufficient documentation

## 2018-01-04 DIAGNOSIS — E1165 Type 2 diabetes mellitus with hyperglycemia: Secondary | ICD-10-CM | POA: Insufficient documentation

## 2018-01-04 DIAGNOSIS — Z8719 Personal history of other diseases of the digestive system: Secondary | ICD-10-CM | POA: Insufficient documentation

## 2018-01-04 DIAGNOSIS — Z91013 Allergy to seafood: Secondary | ICD-10-CM | POA: Insufficient documentation

## 2018-01-04 DIAGNOSIS — Z800 Family history of malignant neoplasm of digestive organs: Secondary | ICD-10-CM | POA: Insufficient documentation

## 2018-01-04 DIAGNOSIS — Z91041 Radiographic dye allergy status: Secondary | ICD-10-CM | POA: Insufficient documentation

## 2018-01-04 DIAGNOSIS — R531 Weakness: Secondary | ICD-10-CM | POA: Insufficient documentation

## 2018-01-04 DIAGNOSIS — F17210 Nicotine dependence, cigarettes, uncomplicated: Secondary | ICD-10-CM | POA: Insufficient documentation

## 2018-01-04 DIAGNOSIS — Z90710 Acquired absence of both cervix and uterus: Secondary | ICD-10-CM | POA: Insufficient documentation

## 2018-01-04 DIAGNOSIS — E559 Vitamin D deficiency, unspecified: Secondary | ICD-10-CM | POA: Insufficient documentation

## 2018-01-04 DIAGNOSIS — Z880 Allergy status to penicillin: Secondary | ICD-10-CM | POA: Insufficient documentation

## 2018-01-04 DIAGNOSIS — Z823 Family history of stroke: Secondary | ICD-10-CM | POA: Insufficient documentation

## 2018-01-04 DIAGNOSIS — Z1231 Encounter for screening mammogram for malignant neoplasm of breast: Secondary | ICD-10-CM

## 2018-01-04 DIAGNOSIS — Z9889 Other specified postprocedural states: Secondary | ICD-10-CM | POA: Insufficient documentation

## 2018-01-04 DIAGNOSIS — IMO0001 Reserved for inherently not codable concepts without codable children: Secondary | ICD-10-CM

## 2018-01-04 DIAGNOSIS — Z791 Long term (current) use of non-steroidal anti-inflammatories (NSAID): Secondary | ICD-10-CM | POA: Insufficient documentation

## 2018-01-04 DIAGNOSIS — Z79899 Other long term (current) drug therapy: Secondary | ICD-10-CM | POA: Insufficient documentation

## 2018-01-04 DIAGNOSIS — Z825 Family history of asthma and other chronic lower respiratory diseases: Secondary | ICD-10-CM | POA: Insufficient documentation

## 2018-01-04 DIAGNOSIS — E781 Pure hyperglyceridemia: Secondary | ICD-10-CM | POA: Insufficient documentation

## 2018-01-04 DIAGNOSIS — M25561 Pain in right knee: Secondary | ICD-10-CM | POA: Insufficient documentation

## 2018-01-04 DIAGNOSIS — Z885 Allergy status to narcotic agent status: Secondary | ICD-10-CM | POA: Insufficient documentation

## 2018-01-04 DIAGNOSIS — Z1239 Encounter for other screening for malignant neoplasm of breast: Secondary | ICD-10-CM

## 2018-01-04 DIAGNOSIS — Z794 Long term (current) use of insulin: Secondary | ICD-10-CM | POA: Insufficient documentation

## 2018-01-04 LAB — POCT GLYCOSYLATED HEMOGLOBIN (HGB A1C): Hemoglobin A1C: 9.2

## 2018-01-04 LAB — GLUCOSE, POCT (MANUAL RESULT ENTRY): POC Glucose: 125 mg/dl — AB (ref 70–99)

## 2018-01-04 MED ORDER — METFORMIN HCL ER 500 MG PO TB24: 500.0000 mg | ORAL_TABLET | Freq: Every day | ORAL | 6 refills | Status: DC

## 2018-01-04 MED FILL — METFORMIN HCL ER 500 MG TAB: 500 | 30 days supply | Qty: 30 | Fill #0

## 2018-01-04 NOTE — Progress Notes (Signed)
Patient ID: Bailey [name], female    DOB: 1956-12-19  MRN: [id number]  CC: Diabetes and Annual Exam   Subjective: Bailey [name] is a 62 y.o. female who presents for chronic ds management/physical. Her concerns today include:  Pt with hx of DM, HL, OA (RT shoulder and knees), gastric ulcer, Vit D def, tob dep  1. DM: checking BS 2 x a day and has log book with her.  Range BF126-200, BF dinner 134-229, after dinner 137-218. Feels stress playing a role in BS bouncing around.  Helping to take care of mother; this has been stressful.  She how has the help of two other siblings nd a Marine scientist. -Exercise: trying to get in exercise when she can Diet: eating more veggies, though frozen. Loves fruits Meds: compliant with Lantus, Amaryl, Januvia Due for eye exam.  No insurance  2. HM: received call from GI for colonoscopy but she had to cancel because OC expired.  She reapplied for OC and will let me know once approved so referral can be resubmitted.  Fhx of colon CA in brother and maternal GM.  Had colonoscopy 2017 by Dr. Collene Mares but inadequate prep. Told to return in 1 yr for repeat   3.  BP elevated today. She limits salt in foods. No CP/SOB/LE edema  4.  Tob dep: she forgot to call [phone] Quit Now because it was not on dischg summary. Still trying to quit  5. Popping and stiffness in neck and knees lately.  No swelling. Had x-ray C-spine 07/2014 which revealed mild degenerative changes.  No recent x-ray of knees Fhx of DJD On Mobic which she takes PRN  Patient Active Problem List   Diagnosis Date Noted  . Tobacco dependence 10/05/2017  . Spinal stenosis of lumbar region with neurogenic claudication 10/05/2017  . Osteoarthritis of spine with radiculopathy, lumbar region 10/05/2017  . Plantar wart of right foot 06/01/2017  . Right sided weakness 12/24/2015  . Dyslipidemia with low high density lipoprotein (HDL) cholesterol with hypertriglyceridemia due to type 2 diabetes mellitus ([place])  11/16/2015  . Uncontrolled type 2 diabetes mellitus with hyperglycemia ([place]) 11/12/2015  . Menopausal symptoms 11/12/2015  . Dupuytren's contracture of both hands 07/05/2013  . Need for Tdap vaccination 07/05/2013  . Arthritis      Current Outpatient Medications on File Prior to Visit  Medication Sig Dispense Refill  . acetaminophen (TYLENOL) 500 MG tablet Take 1 tablet (500 mg total) by mouth every 6 (six) hours as needed for mild pain or moderate pain. 30 tablet 0  . atorvastatin (LIPITOR) 40 MG tablet Take 1 tablet (40 mg total) by mouth daily. 30 tablet 2  . Blood Glucose Monitoring Suppl (TRUE METRIX METER) DEVI 1 each by Does not apply route 3 (three) times daily. 1 Device 0  . cetirizine (ZYRTEC) 10 MG tablet Take 1 tablet (10 mg total) by mouth daily. 30 tablet 11  . Cholecalciferol (VITAMIN D3) 2000 units TABS Take 2,000 Units by mouth daily. 30 tablet 11  . glimepiride (AMARYL) 4 MG tablet Take 2 tablets (8 mg total) by mouth daily before breakfast. 60 tablet 11  . glucose blood (TRUE METRIX BLOOD GLUCOSE TEST) test strip 1 each by Other route 3 (three) times daily. 100 each 12  . glucose blood (TRUE METRIX BLOOD GLUCOSE TEST) test strip Use as instructed 100 each 12  . hydrocortisone valerate cream (WESTCORT) 0.2 % Apply 1 application topically 2 (two) times daily. 45 g 0  . insulin  glargine (LANTUS) 100 unit/mL SOPN Inject 0.35 mLs (35 Units total) into the skin at bedtime. 15 mL 11  . Insulin Pen Needle (BD ULTRA-FINE PEN NEEDLES) 29G X 12.7MM MISC 1 each by Does not apply route 3 (three) times daily. 100 each 12  . Insulin Syringe-Needle U-100 (B-D INS SYRINGE 0.5CC/31GX5/16) 31G X 5/16" 0.5 ML MISC 1 each by Does not apply route at bedtime. 30 each 5  . Lancet Devices (ACCU-CHEK SOFTCLIX) lancets Use as instructed 1 each 0  . meloxicam (MOBIC) 15 MG tablet Take 1 tablet (15 mg total) by mouth daily. 30 tablet 5  . methocarbamol (ROBAXIN) 500 MG tablet Take 1 tablet (500 mg  total) by mouth every 8 (eight) hours as needed for muscle spasms. 30 tablet 0  . Multiple Vitamins-Minerals (MULTIVITAMIN WITH MINERALS) tablet Take 1 tablet by mouth every other day.     . sitaGLIPtin (JANUVIA) 100 MG tablet Take 1 tablet (100 mg total) by mouth daily. 90 tablet 3  . TRUEPLUS LANCETS 28G MISC 1 each by Does not apply route 3 (three) times daily. 100 each 11  . VIVELLE-DOT 0.025 MG/24HR PLACE 1 PATCH ONTO THE SKIN 2 TIMES A WEEK. 8 patch 12  . [DISCONTINUED] albuterol (PROVENTIL HFA;VENTOLIN HFA) 108 (90 BASE) MCG/ACT inhaler Inhale 2 puffs into the lungs every 4 (four) hours as needed for wheezing (cough). (Patient not taking: Reported on 08/22/2015) 1 Inhaler 0   No current facility-administered medications on file prior to visit.     Allergies  Allergen Reactions  . Metformin And Related Diarrhea  . Shellfish Allergy Anaphylaxis, Hives and Swelling    Lobster, crab (shrimp's ok)  . Ivp Dye [Iodinated Diagnostic Agents] Hives  . Adhesive [Tape] Other (See Comments)    blisters  . Chantix [Varenicline] Other (See Comments)    depression  . Codeine Other (See Comments)    Unknown childhood allergy  . Hydrocodone-Acetaminophen   . Latex Other (See Comments)    blisters  . Penicillins Other (See Comments)    Unknown childhood reaction    Social History   Socioeconomic History  . Marital status: Married    Spouse name: Louie Casa  . Number of children: 0  . Years of education: 75  . Highest education level: Not on file  Social Needs  . Financial resource strain: Not on file  . Food insecurity - worry: Not on file  . Food insecurity - inability: Not on file  . Transportation needs - medical: Not on file  . Transportation needs - non-medical: Not on file  Occupational History  . Occupation: Dispensing optician: [employer]     Comment: Civil engineer, contracting  Tobacco Use  . Smoking status: Current Every Day Smoker    Packs/day: 0.50    Years: 28.00      Pack years: 14.00    Types: Cigarettes  . Smokeless tobacco: Never Used  Substance and Sexual Activity  . Alcohol use: No    Alcohol/week: 0.0 oz  . Drug use: No  . Sexual activity: Yes    Partners: Male    Birth control/protection: Surgical  Other Topics Concern  . Not on file  Social History Narrative   Currently living alone, separated from husband.  One story home.  [name]'s children are adults-2 live in [place], 2 live in [place].     Works for a [employer].     Education: 2 years of college.    Family History  Problem Relation  Age of Onset  . Diabetes Mother   . Hypertension Mother   . Dementia Mother   . Pancreatitis Mother   . Asthma Mother   . COPD Mother   . Stroke Mother   . Hyperlipidemia Mother   . Heart disease Mother   . Colon cancer Mother   . Hypertension Father   . Hypertension Sister   . Diabetes Sister   . Hyperlipidemia Sister   . Hypertension Brother   . Diabetes Brother   . Drug abuse Brother   . Cancer Brother        colon  . Hyperlipidemia Brother   . Hyperlipidemia Brother   . Hypertension Brother   . Colon cancer Brother   . Hyperlipidemia Sister   . Hypertension Sister   . Diabetes Sister   . Diabetes Maternal Grandmother     Past Surgical History:  Procedure Laterality Date  . COLONOSCOPY  last one 07-15-2016  . ESOPHAGOGASTRODUODENOSCOPY  last one 2008  . FASCIECTOMY Left 08/24/2016   Procedure: LEFT HAND PALMER AND SMALL FINGER DIGiTAL FASCIECTOMY AND RELEASE;  Surgeon: Iran Planas, MD;  Location: [facility];  Service: Orthopedics;  Laterality: Left;  . LAPAROSCOPIC CHOLECYSTECTOMY  07/17/2000  . ROTATOR CUFF REPAIR Right 2000  . TOTAL ABDOMINAL HYSTERECTOMY W/ BILATERAL SALPINGOOPHORECTOMY  1998    ROS: Review of Systems Ears: feels a bump at opening to RT ear canal.  PHYSICAL EXAM: BP 124/70   Pulse 69   Temp 98 F (36.7 C) (Oral)   Resp 16   Wt 156 lb (70.8 kg)   SpO2 100%   BMI  28.53 kg/m   Wt Readings from Last 3 Encounters:  01/04/18 156 lb (70.8 kg)  10/05/17 154 lb 6.4 oz (70 kg)  09/14/17 157 lb 3.2 oz (71.3 kg)   Physical Exam General appearance - alert, well appearing, older AAF and in no distress Mental status - alert, oriented to person, place, and time, normal mood, behavior, speech, dress, motor activity, and thought processes Eyes - pupils equal and reactive, extraocular eye movements intact Ears -tiny hair bump at opening to RT ear canal. Otherwise canals and TTM WNL Nose - normal and patent, no erythema, discharge or polyps Mouth - mucous membranes moist, pharynx normal without lesions. Dentures above Neck - supple, no significant adenopathy Lymphatics -no cervical or axillary LN Chest - clear to auscultation, no wheezes, rales or rhonchi, symmetric air entry Heart - normal rate, regular rhythm, normal S1, S2, no murmurs, rubs, clicks or gallops Musculoskeletal - mild contracture of RT 5th finger. Mild thickening of skin on RT palm.  Neck - supple. No point tenderness Knees: no edema/erythema.  Good ROM. Joints appear normal size Extremities -no LE edema  Results for orders placed or performed in visit on 01/04/18  POCT glucose (manual entry)  Result Value Ref Range   POC Glucose 125 (A) 70 - 99 mg/dl  POCT glycosylated hemoglobin (Hb A1C)  Result Value Ref Range   Hemoglobin A1C 9.2     ASSESSMENT AND PLAN: 1. Uncontrolled diabetes mellitus type 2 without complications ([facility]) -Z1I increased. Discussed adding on another oral med.  She did not tolerate Metformin in past due to diarrhea.  Willing to try the XR form.  She will let me know if this causes diarrhea also. -Discussed healthy eating She will try to increase exercise activity now that she has more help in caring for mother.  - POCT glucose (manual entry) - POCT glycosylated hemoglobin (  Hb A1C)  2. Tobacco dependence Encourage pt to call 1-800-Quit NOW to get free patches  3.  Chronic pain of both knees 4. Neck pain -exam today reassuring. Encourage pt to stay active -Mobic PRN   5. Need for Tdap vaccination Pt to get at pharmacy today  6. Breast cancer screening - MM Digital Screening; Future  7. Family hx of colon cancer requiring screening colonoscopy -pt will have financial counselor notify me once she is approved for OC so that I can resubmit referral for colonoscopy   Patient was given the opportunity to ask questions.  Patient verbalized understanding of the plan and was able to repeat key elements of the plan.   Orders Placed This Encounter  Procedures  . MM Digital Screening  . POCT glucose (manual entry)  . POCT glycosylated hemoglobin (Hb A1C)     Requested Prescriptions   Signed Prescriptions Disp Refills  . metFORMIN (GLUCOPHAGE XR) 500 MG 24 hr tablet 30 tablet 6    Sig: Take 1 tablet (500 mg total) by mouth daily with breakfast.    Return in about 3 months (around 04/04/2018).  Karle Plumber, MD, FACP

## 2018-01-04 NOTE — Patient Instructions (Addendum)
Please follow up with [facility] to receive your tdap  Try the long acting Metformin 500 mg once a day.  Let me know if you do not tolerate it.   Call 1-800-Quit Now to get nicotine patches.    Try to stay active.

## 2018-01-04 NOTE — Progress Notes (Signed)
Pt is requesting a complete physical today if time

## 2018-01-15 ENCOUNTER — Other Ambulatory Visit: Payer: Self-pay | Admitting: Pharmacist

## 2018-01-15 MED ORDER — TETANUS-DIPHTH-ACELL PERTUSSIS 5-2.5-18.5 LF-MCG/0.5 IM SUSP: 0.5000 mL | Freq: Once | INTRAMUSCULAR | 0 refills | Status: AC

## 2018-01-18 MED FILL — MELOXICAM 15 MG TABLET: 15 | 30 days supply | Qty: 30 | Fill #4

## 2018-01-18 MED FILL — ?ATORVASTATIN 40MG TABLET: 40 | 30 days supply | Qty: 30 | Fill #1

## 2018-01-26 ENCOUNTER — Telehealth: Payer: Self-pay | Admitting: Internal Medicine

## 2018-01-26 DIAGNOSIS — Z1211 Encounter for screening for malignant neoplasm of colon: Secondary | ICD-10-CM

## 2018-01-26 NOTE — Telephone Encounter (Signed)
Referral has been placed. 

## 2018-01-26 NOTE — Telephone Encounter (Signed)
Pt called to get a referral for a Gastroenterology for a colonoscopy, please sen want to be sent to [facility] gastroenterology, please follow up

## 2018-02-02 ENCOUNTER — Other Ambulatory Visit: Payer: Self-pay | Admitting: Internal Medicine

## 2018-02-07 MED FILL — TRUE METRIX TEST STRIP: 33 days supply | Qty: 100 | Fill #3

## 2018-02-07 MED FILL — $BOOSTRIX VACCINE SYRINGE: 5-2.5-18.5 | 1 days supply | Qty: 1 | Fill #0

## 2018-02-07 MED FILL — METFORMIN HCL ER 500 MG TAB: 500 | 30 days supply | Qty: 30 | Fill #1

## 2018-02-07 MED FILL — ?GLIMEPIRIDE 4 MG TABLET: 4 | 30 days supply | Qty: 60 | Fill #8

## 2018-02-07 MED FILL — TRUEplus LANCETS 28G MISC: 33 days supply | Qty: 100 | Fill #1

## 2018-02-12 ENCOUNTER — Telehealth: Payer: Self-pay | Admitting: Internal Medicine

## 2018-02-12 NOTE — Telephone Encounter (Signed)
Patient called about referral patient was informed she was denied for [place] due to insurance and may be on hold til April for [place].

## 2018-02-12 NOTE — Telephone Encounter (Signed)
Thank You.

## 2018-02-13 MED FILL — ?ATORVASTATIN 40MG TABLET: 40 | 30 days supply | Qty: 30 | Fill #2

## 2018-02-13 MED FILL — $LANTUS SOLOSTAR 100 UNITS/: 100 | 34 days supply | Qty: 12 | Fill #4

## 2018-02-13 MED FILL — MELOXICAM 15 MG TABLET: 15 | 30 days supply | Qty: 30 | Fill #5

## 2018-02-16 ENCOUNTER — Other Ambulatory Visit: Payer: Self-pay | Admitting: Internal Medicine

## 2018-02-16 DIAGNOSIS — Z1231 Encounter for screening mammogram for malignant neoplasm of breast: Secondary | ICD-10-CM

## 2018-02-22 MED FILL — ESTRADIOL 0.025 MG PATCH: 0.025 | 30 days supply | Qty: 8 | Fill #5

## 2018-02-23 MED FILL — TRUE METRIX TEST STRIP: 15 days supply | Qty: 50 | Fill #4

## 2018-03-06 MED FILL — METFORMIN HCL ER 500 MG TAB: 500 | 30 days supply | Qty: 30 | Fill #2

## 2018-03-12 ENCOUNTER — Telehealth: Payer: Self-pay | Admitting: Internal Medicine

## 2018-03-12 NOTE — Telephone Encounter (Signed)
Pt came in to request an update on a mammogram she was supposed to have but never received,please follow up

## 2018-03-13 NOTE — Telephone Encounter (Signed)
Pt already has an appointment for MM

## 2018-03-19 ENCOUNTER — Other Ambulatory Visit: Payer: Self-pay | Admitting: Internal Medicine

## 2018-03-19 ENCOUNTER — Other Ambulatory Visit: Payer: Self-pay | Admitting: Physician Assistant

## 2018-03-19 DIAGNOSIS — S20221D Contusion of right back wall of thorax, subsequent encounter: Secondary | ICD-10-CM

## 2018-03-19 DIAGNOSIS — E782 Mixed hyperlipidemia: Principal | ICD-10-CM

## 2018-03-19 DIAGNOSIS — E1169 Type 2 diabetes mellitus with other specified complication: Secondary | ICD-10-CM

## 2018-03-19 MED FILL — $LANTUS SOLOSTAR 100 UNITS/: 100 | 34 days supply | Qty: 12 | Fill #5

## 2018-03-19 MED FILL — ESTRADIOL 0.025 MG PATCH: 0.025 | 30 days supply | Qty: 8 | Fill #6

## 2018-03-19 MED FILL — ?ATORVASTATIN 40MG TABLET: 40 | 30 days supply | Qty: 30 | Fill #0

## 2018-03-20 MED FILL — MELOXICAM 15 MG TABLET: 15 | 30 days supply | Qty: 30 | Fill #0

## 2018-03-23 ENCOUNTER — Ambulatory Visit
Admission: RE | Admit: 2018-03-23 | Discharge: 2018-03-23 | Disposition: A | Discharge status: Home / Self Care | Payer: No Typology Code available for payment source | Source: Ambulatory Visit | Attending: Internal Medicine | Admitting: Internal Medicine

## 2018-03-23 DIAGNOSIS — Z1231 Encounter for screening mammogram for malignant neoplasm of breast: Secondary | ICD-10-CM

## 2018-03-23 MED FILL — ?CETIRIZINE HCL 10 MG TABLE: 10 | 30 days supply | Qty: 30 | Fill #7

## 2018-03-23 MED FILL — ?GLIMEPIRIDE 4 MG TABLET: 4 | 30 days supply | Qty: 60 | Fill #9

## 2018-04-09 ENCOUNTER — Encounter: Payer: Self-pay | Admitting: Internal Medicine

## 2018-04-09 ENCOUNTER — Ambulatory Visit: Payer: Self-pay | Attending: Internal Medicine | Admitting: Internal Medicine

## 2018-04-09 VITALS — BP 124/80 | HR 66 | Temp 98.2°F | Resp 16 | Wt 152.4 lb

## 2018-04-09 DIAGNOSIS — R232 Flushing: Secondary | ICD-10-CM

## 2018-04-09 DIAGNOSIS — E1165 Type 2 diabetes mellitus with hyperglycemia: Secondary | ICD-10-CM

## 2018-04-09 DIAGNOSIS — N898 Other specified noninflammatory disorders of vagina: Secondary | ICD-10-CM | POA: Insufficient documentation

## 2018-04-09 DIAGNOSIS — Z90710 Acquired absence of both cervix and uterus: Secondary | ICD-10-CM | POA: Insufficient documentation

## 2018-04-09 DIAGNOSIS — Z888 Allergy status to other drugs, medicaments and biological substances status: Secondary | ICD-10-CM | POA: Insufficient documentation

## 2018-04-09 DIAGNOSIS — Z885 Allergy status to narcotic agent status: Secondary | ICD-10-CM | POA: Insufficient documentation

## 2018-04-09 DIAGNOSIS — IMO0001 Reserved for inherently not codable concepts without codable children: Secondary | ICD-10-CM

## 2018-04-09 DIAGNOSIS — Z880 Allergy status to penicillin: Secondary | ICD-10-CM | POA: Insufficient documentation

## 2018-04-09 DIAGNOSIS — F17200 Nicotine dependence, unspecified, uncomplicated: Secondary | ICD-10-CM

## 2018-04-09 DIAGNOSIS — Z91013 Allergy to seafood: Secondary | ICD-10-CM | POA: Insufficient documentation

## 2018-04-09 DIAGNOSIS — E119 Type 2 diabetes mellitus without complications: Secondary | ICD-10-CM | POA: Insufficient documentation

## 2018-04-09 DIAGNOSIS — Z9889 Other specified postprocedural states: Secondary | ICD-10-CM | POA: Insufficient documentation

## 2018-04-09 DIAGNOSIS — N951 Menopausal and female climacteric states: Secondary | ICD-10-CM | POA: Insufficient documentation

## 2018-04-09 DIAGNOSIS — Z1211 Encounter for screening for malignant neoplasm of colon: Secondary | ICD-10-CM

## 2018-04-09 DIAGNOSIS — F17210 Nicotine dependence, cigarettes, uncomplicated: Secondary | ICD-10-CM | POA: Insufficient documentation

## 2018-04-09 LAB — POCT GLYCOSYLATED HEMOGLOBIN (HGB A1C): Hemoglobin A1C: 8

## 2018-04-09 LAB — GLUCOSE, POCT (MANUAL RESULT ENTRY): POC Glucose: 113 mg/dl — AB (ref 70–99)

## 2018-04-09 MED ORDER — GABAPENTIN 100 MG PO CAPS: 200.0000 mg | ORAL_CAPSULE | Freq: Every day | ORAL | 3 refills | Status: DC

## 2018-04-09 MED ORDER — DAPAGLIFLOZIN PROPANEDIOL 5 MG PO TABS: 5.0000 mg | ORAL_TABLET | Freq: Every day | ORAL | 5 refills | Status: DC

## 2018-04-09 MED FILL — FARXIGA 5 MG TABLET: 5 | 30 days supply | Qty: 30 | Fill #0

## 2018-04-09 MED FILL — GABAPENTIN 100 MG CAPSULE: 100 | 30 days supply | Qty: 60 | Fill #0

## 2018-04-09 NOTE — Progress Notes (Signed)
Patient ID: Bailey [name], female    DOB: 1956-04-12  MRN: [id number]  CC: Gynecologic Exam and Diabetes   Subjective: Bailey [name] is a 62 y.o. female who presents for chronic disease management. Her concerns today include:  Pt with hx of DM, HL, OA (RT shoulder and knees), gastric ulcer, Vit D def, tob dep  1.  DM:  Changed to Metformin XR on last visit. Still causing some diarrhea.  Reports compliance with Lantus 36 units Has BS log:  A.m 94-155, bedtime 113-239. Eating habits are okay  2. Fhx of colon CA in brother and maternal GM. Had colonoscopy 2017 by Dr. Collene Mares but inadequate prep. Told to return in 1 yr for repeat.  She needs me to resubmit the referral for her today be seen by lab our GI.   3.  C/o vaginal discgh x 1 mth -cloudy color.  No itching; sexually active but not often; last was 2-3 mths ago.  Had total hysterectomy in 1995.  Noncancerous reason -c/o worsening hot flashes at nights x 3 mths.  On Estrogen patch on and off since hysterectomy.  She is wondering whether she needs a higher dose  4.  Tob:  Did call 800-Quit Now.  She has the nicotine patches but not using consistently.  Some days she forgets to put one on  Patient Active Problem List   Diagnosis Date Noted  . Tobacco dependence 10/05/2017  . Spinal stenosis of lumbar region with neurogenic claudication 10/05/2017  . Osteoarthritis of spine with radiculopathy, lumbar region 10/05/2017  . Plantar wart of right foot 06/01/2017  . Right sided weakness 12/24/2015  . Dyslipidemia with low high density lipoprotein (HDL) cholesterol with hypertriglyceridemia due to type 2 diabetes mellitus ([place]) 11/16/2015  . Uncontrolled type 2 diabetes mellitus with hyperglycemia ([place]) 11/12/2015  . Menopausal symptoms 11/12/2015  . Dupuytren's contracture of both hands 07/05/2013  . Need for Tdap vaccination 07/05/2013  . Arthritis      Current Outpatient Medications on File Prior to Visit  Medication  Sig Dispense Refill  . acetaminophen (TYLENOL) 500 MG tablet Take 1 tablet (500 mg total) by mouth every 6 (six) hours as needed for mild pain or moderate pain. 30 tablet 0  . atorvastatin (LIPITOR) 40 MG tablet TAKE 1 TABLET BY MOUTH DAILY. 30 tablet 2  . Blood Glucose Monitoring Suppl (TRUE METRIX METER) DEVI 1 each by Does not apply route 3 (three) times daily. 1 Device 0  . cetirizine (ZYRTEC) 10 MG tablet Take 1 tablet (10 mg total) by mouth daily. 30 tablet 11  . Cholecalciferol (VITAMIN D3) 2000 units TABS Take 2,000 Units by mouth daily. 30 tablet 11  . glimepiride (AMARYL) 4 MG tablet Take 2 tablets (8 mg total) by mouth daily before breakfast. 60 tablet 11  . glucose blood (TRUE METRIX BLOOD GLUCOSE TEST) test strip 1 each by Other route 3 (three) times daily. 100 each 12  . glucose blood (TRUE METRIX BLOOD GLUCOSE TEST) test strip Use as instructed 100 each 12  . hydrocortisone valerate cream (WESTCORT) 0.2 % Apply 1 application topically 2 (two) times daily. 45 g 0  . insulin glargine (LANTUS) 100 unit/mL SOPN Inject 0.35 mLs (35 Units total) into the skin at bedtime. 15 mL 11  . Insulin Pen Needle (BD ULTRA-FINE PEN NEEDLES) 29G X 12.7MM MISC 1 each by Does not apply route 3 (three) times daily. 100 each 12  . Insulin Syringe-Needle U-100 (B-D INS SYRINGE 0.5CC/31GX5/16) 31G X  5/16" 0.5 ML MISC 1 each by Does not apply route at bedtime. 30 each 5  . Lancet Devices (ACCU-CHEK SOFTCLIX) lancets Use as instructed 1 each 0  . meloxicam (MOBIC) 15 MG tablet TAKE 1 TABLET BY MOUTH DAILY. 30 tablet 0  . metFORMIN (GLUCOPHAGE XR) 500 MG 24 hr tablet Take 1 tablet (500 mg total) by mouth daily with breakfast. 30 tablet 6  . methocarbamol (ROBAXIN) 500 MG tablet Take 1 tablet (500 mg total) by mouth every 8 (eight) hours as needed for muscle spasms. 30 tablet 0  . Multiple Vitamins-Minerals (MULTIVITAMIN WITH MINERALS) tablet Take 1 tablet by mouth every other day.     . sitaGLIPtin (JANUVIA)  100 MG tablet Take 1 tablet (100 mg total) by mouth daily. 90 tablet 3  . TRUEPLUS LANCETS 28G MISC 1 each by Does not apply route 3 (three) times daily. 100 each 11  . VIVELLE-DOT 0.025 MG/24HR PLACE 1 PATCH ONTO THE SKIN 2 TIMES A WEEK. 8 patch 12   No current facility-administered medications on file prior to visit.     Allergies  Allergen Reactions  . Metformin And Related Diarrhea  . Shellfish Allergy Anaphylaxis, Hives and Swelling    Lobster, crab (shrimp's ok)  . Ivp Dye [Iodinated Diagnostic Agents] Hives  . Adhesive [Tape] Other (See Comments)    blisters  . Chantix [Varenicline] Other (See Comments)    depression  . Codeine Other (See Comments)    Unknown childhood allergy  . Hydrocodone-Acetaminophen   . Latex Other (See Comments)    blisters  . Penicillins Other (See Comments)    Unknown childhood reaction    Social History   Socioeconomic History  . Marital status: Married    Spouse name: Louie Casa  . Number of children: 0  . Years of education: 12  . Highest education level: Not on file  Occupational History  . Occupation: Dispensing optician: [employer]     Comment: Civil engineer, contracting  Social Needs  . Financial resource strain: Not on file  . Food insecurity:    Worry: Not on file    Inability: Not on file  . Transportation needs:    Medical: Not on file    Non-medical: Not on file  Tobacco Use  . Smoking status: Current Every Day Smoker    Packs/day: 0.50    Years: 28.00    Pack years: 14.00    Types: Cigarettes  . Smokeless tobacco: Never Used  Substance and Sexual Activity  . Alcohol use: No    Alcohol/week: 0.0 oz  . Drug use: No  . Sexual activity: Yes    Partners: Male    Birth control/protection: Surgical  Lifestyle  . Physical activity:    Days per week: Not on file    Minutes per session: Not on file  . Stress: Not on file  Relationships  . Social connections:    Talks on phone: Not on file    Gets together: Not  on file    Attends religious service: Not on file    Active member of club or organization: Not on file    Attends meetings of clubs or organizations: Not on file    Relationship status: Not on file  . Intimate partner violence:    Fear of current or ex partner: Not on file    Emotionally abused: Not on file    Physically abused: Not on file    Forced sexual activity: Not on file  Other Topics Concern  . Not on file  Social History Narrative   Currently living alone, separated from husband.  One story home.  [name]'s children are adults-2 live in [place], 2 live in [place].     Works for a [employer].     Education: 2 years of college.    Family History  Problem Relation Age of Onset  . Diabetes Mother   . Hypertension Mother   . Dementia Mother   . Pancreatitis Mother   . Asthma Mother   . COPD Mother   . Stroke Mother   . Hyperlipidemia Mother   . Heart disease Mother   . Colon cancer Mother   . Hypertension Father   . Hypertension Sister   . Diabetes Sister   . Hyperlipidemia Sister   . Hypertension Brother   . Diabetes Brother   . Drug abuse Brother   . Cancer Brother        colon  . Hyperlipidemia Brother   . Hyperlipidemia Brother   . Hypertension Brother   . Colon cancer Brother   . Hyperlipidemia Sister   . Hypertension Sister   . Diabetes Sister   . Diabetes Maternal Grandmother   . Breast cancer Neg Hx     Past Surgical History:  Procedure Laterality Date  . COLONOSCOPY  last one 07-15-2016  . ESOPHAGOGASTRODUODENOSCOPY  last one 2008  . FASCIECTOMY Left 08/24/2016   Procedure: LEFT HAND PALMER AND SMALL FINGER DIGiTAL FASCIECTOMY AND RELEASE;  Surgeon: Iran Planas, MD;  Location: [facility];  Service: Orthopedics;  Laterality: Left;  . LAPAROSCOPIC CHOLECYSTECTOMY  07/17/2000  . ROTATOR CUFF REPAIR Right 2000  . TOTAL ABDOMINAL HYSTERECTOMY W/ BILATERAL SALPINGOOPHORECTOMY  1998    ROS: Review of Systems Negative  except as above PHYSICAL EXAM: BP 124/80   Pulse 66   Temp 98.2 F (36.8 C) (Oral)   Resp 16   Wt 152 lb 6.4 oz (69.1 kg)   SpO2 100%   BMI 27.87 kg/m   Wt Readings from Last 3 Encounters:  04/09/18 152 lb 6.4 oz (69.1 kg)  01/04/18 156 lb (70.8 kg)  10/05/17 154 lb 6.4 oz (70 kg)    Physical Exam  General appearance - alert, well appearing, and in no distress Mental status - alert, oriented to person, place, and time, normal mood, behavior, speech, dress, motor activity, and thought processes Mouth - mucous membranes moist, pharynx normal without lesions Neck - supple, no significant adenopathy.  No thyroid enlargement.  No thyroid nodules Chest - clear to auscultation, no wheezes, rales or rhonchi, symmetric air entry Heart - normal rate, regular rhythm, normal S1, S2, no murmurs, rubs, clicks or gallops Pelvic -no external vaginal lesions.  Small amount of thin white discharge in the vaginal vault.  She has no cervical cuff Extremities -no lower extremity edema  Results for orders placed or performed in visit on 04/09/18  POCT glucose (manual entry)  Result Value Ref Range   POC Glucose 113 (A) 70 - 99 mg/dl  POCT glycosylated hemoglobin (Hb A1C)  Result Value Ref Range   Hemoglobin A1C 8.0     ASSESSMENT AND PLAN: 1. Diabetes mellitus type 2, uncontrolled, without complications ([place]) -Improved since last visit She will finish off the current bottle of metformin and then discontinue taking it.  She will then start [place].  Continue Lantus - POCT glucose (manual entry) - POCT glycosylated hemoglobin (Hb A1C) - dapagliflozin propanediol (FARXIGA) 5 MG TABS tablet; Take  5 mg by mouth daily.  Dispense: 30 tablet; Refill: 5  2. Tobacco dependence Commended her on calling 1 800 quit now.  Encourage her to try to use the patches consistently  3. Vaginal discharge - Cervicovaginal ancillary only  4. Hot flashes Patient with risk factors for heart disease including  diabetes and cigarette smoking.  Not sure that she is a good candidate for continued estrogen patch.  Also she is well past the age of menopause; sure why she would have increased hot flashes.  I recommend trying her with a low dose of gabapentin at bedtime and referring to GYN to see whether they would recommend continuing and increasing the dose on estrogen patch - Ambulatory referral to Gynecology - gabapentin (NEURONTIN) 100 MG capsule; Take 2 capsules (200 mg total) by mouth at bedtime.  Dispense: 60 capsule; Refill: 3  5. Screening for colon cancer - Ambulatory referral to Gastroenterology  Patient was given the opportunity to ask questions.  Patient verbalized understanding of the plan and was able to repeat key elements of the plan.   Orders Placed This Encounter  Procedures  . Ambulatory referral to Gastroenterology  . Ambulatory referral to Gynecology  . POCT glucose (manual entry)  . POCT glycosylated hemoglobin (Hb A1C)     Requested Prescriptions   Signed Prescriptions Disp Refills  . dapagliflozin propanediol (FARXIGA) 5 MG TABS tablet 30 tablet 5    Sig: Take 5 mg by mouth daily.  Marland Kitchen gabapentin (NEURONTIN) 100 MG capsule 60 capsule 3    Sig: Take 2 capsules (200 mg total) by mouth at bedtime.    Return in about 3 months (around 07/09/2018).  Karle Plumber, MD, FACP

## 2018-04-09 NOTE — Patient Instructions (Signed)
Stop Metformin.  Start [place] instead.  Continue to work on quitting smoking.

## 2018-04-10 ENCOUNTER — Ambulatory Visit: Payer: Self-pay | Attending: Internal Medicine | Admitting: Internal Medicine

## 2018-04-10 DIAGNOSIS — Z111 Encounter for screening for respiratory tuberculosis: Secondary | ICD-10-CM

## 2018-04-10 LAB — CERVICOVAGINAL ANCILLARY ONLY
BACTERIAL VAGINITIS: NEGATIVE
CHLAMYDIA, DNA PROBE: NEGATIVE
Candida vaginitis: NEGATIVE
NEISSERIA GONORRHEA: NEGATIVE
Trichomonas: NEGATIVE

## 2018-04-12 ENCOUNTER — Ambulatory Visit: Payer: No Typology Code available for payment source | Attending: Internal Medicine

## 2018-04-12 ENCOUNTER — Telehealth: Payer: Self-pay

## 2018-04-12 ENCOUNTER — Ambulatory Visit (HOSPITAL_COMMUNITY)
Admission: RE | Admit: 2018-04-12 | Discharge: 2018-04-12 | Disposition: A | Discharge status: Home / Self Care | Payer: No Typology Code available for payment source | Source: Ambulatory Visit | Attending: Internal Medicine | Admitting: Internal Medicine

## 2018-04-12 DIAGNOSIS — R7611 Nonspecific reaction to tuberculin skin test without active tuberculosis: Secondary | ICD-10-CM | POA: Insufficient documentation

## 2018-04-12 LAB — TB SKIN TEST: TB SKIN TEST: POSITIVE

## 2018-04-12 NOTE — Telephone Encounter (Signed)
Contacted pt to go over vaginal swab results pt is aware and doesn't have any questions or concerns

## 2018-04-12 NOTE — Progress Notes (Signed)
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 Pt sent for CXR

## 2018-04-12 NOTE — Progress Notes (Signed)
Patient here today to have PPD site read.   PPD read and results entered in [place]. Result: 15x15 mm induration. Interpretation: positive Jackelyn Knife, RMA

## 2018-04-16 ENCOUNTER — Telehealth: Payer: Self-pay

## 2018-04-16 NOTE — Telephone Encounter (Signed)
Patient called regarding chest x ray   CMA inform patient that Chest x ray came back negative for any signs of active TB and foe her to go to the Health department regarding of a prophylactic therapy with INH   Patient was aware and understood   Patient wanted a copy CMA will print & leave @ front desk to pick up

## 2018-04-17 ENCOUNTER — Encounter: Payer: Self-pay | Admitting: Internal Medicine

## 2018-04-20 ENCOUNTER — Telehealth: Payer: Self-pay | Admitting: Gastroenterology

## 2018-04-20 NOTE — Telephone Encounter (Signed)
do not see the records

## 2018-04-20 NOTE — Telephone Encounter (Signed)
Patient states that she is due for another colonoscopy. Dr. Silverio Decamp is Doc of the Day for 04/10/18 AM. Records placed on her desk for review.

## 2018-04-26 MED FILL — $LANTUS SOLOSTAR 100 UNITS/: 100 | 34 days supply | Qty: 12 | Fill #6

## 2018-04-26 MED FILL — ?ATORVASTATIN 40MG TABLET: 40 | 30 days supply | Qty: 30 | Fill #1

## 2018-04-26 NOTE — Telephone Encounter (Signed)
Dr. Silverio Decamp reviewed records and accepted for patient to be scheduled for a direct colonoscopy in [place]. Left message for patient to call back and schedule an appointment.

## 2018-04-26 NOTE — Telephone Encounter (Signed)
Reviewed colonoscopy report and pathology  Colonoscopy July 15, 2016 Poor bowel prep, GoLYTELY was used. 7 mm sessile polyp removed from ascending colon, was tubular adenoma  Family history positive for colon cancer in brother  Patient was recommended to repeat colonoscopy in 1 year  Given last bowel prep was inadequate, patient needs repeat colonoscopy with extended 2 days bowel prep. Please schedule preop RN visit  Damaris Hippo , MD [phone]

## 2018-04-26 NOTE — Telephone Encounter (Signed)
Left message for patient to return my call.

## 2018-04-30 ENCOUNTER — Other Ambulatory Visit: Payer: Self-pay

## 2018-04-30 ENCOUNTER — Encounter: Payer: Self-pay | Admitting: Gastroenterology

## 2018-04-30 DIAGNOSIS — E1165 Type 2 diabetes mellitus with hyperglycemia: Secondary | ICD-10-CM

## 2018-04-30 MED ORDER — GLIMEPIRIDE 4 MG PO TABS: 8.0000 mg | ORAL_TABLET | Freq: Every day | ORAL | 2 refills | Status: DC

## 2018-04-30 MED FILL — GLIMEPIRIDE 4 MG TABS: 4 | 30 days supply | Qty: 60 | Fill #0

## 2018-05-08 ENCOUNTER — Encounter: Payer: Self-pay | Admitting: Internal Medicine

## 2018-05-08 NOTE — Progress Notes (Signed)
Rec documentation from HD dated 05/01/2018 that pt's T-spot TB test was negative.

## 2018-05-09 MED FILL — TRUEPLUS PEN NDL 32GX5/32: 32G X 4 MM | 33 days supply | Qty: 100 | Fill #2

## 2018-05-09 MED FILL — TRUEPLUS PEN NDL 32GX5/32": 32G X 4 MM | 33 days supply | Qty: 100 | Fill #2

## 2018-06-04 ENCOUNTER — Encounter: Payer: No Typology Code available for payment source | Admitting: Obstetrics and Gynecology

## 2018-06-08 ENCOUNTER — Other Ambulatory Visit: Payer: Self-pay

## 2018-06-08 ENCOUNTER — Ambulatory Visit (AMBULATORY_SURGERY_CENTER): Payer: Self-pay | Admitting: *Deleted

## 2018-06-08 VITALS — Ht 61.0 in | Wt 148.0 lb

## 2018-06-08 DIAGNOSIS — Z86010 Personal history of colonic polyps: Secondary | ICD-10-CM

## 2018-06-08 DIAGNOSIS — Z800 Family history of malignant neoplasm of digestive organs: Secondary | ICD-10-CM

## 2018-06-08 MED ORDER — PEG-KCL-NACL-NASULF-NA ASC-C 140 G PO SOLR: 1.0000 | Freq: Once | ORAL | 0 refills | Status: AC

## 2018-06-08 NOTE — Progress Notes (Signed)
Patient denies any allergies to eggs or soy. Patient denies any problems with anesthesia/sedation. Patient denies any oxygen use at home. Patient denies taking any diet/weight loss medications or blood thinners. EMMI education assisgned to patient on colonoscopy, this was explained and instructions given to patient. Plenvu sample given to pt. And 2 day prep w/Miralax.

## 2018-06-12 ENCOUNTER — Other Ambulatory Visit: Payer: Self-pay

## 2018-06-12 MED FILL — FARXIGA 5 MG TABLET: 5 | 30 days supply | Qty: 30 | Fill #1

## 2018-06-12 MED FILL — GLIMEPIRIDE 4 MG TABS: 4 | 30 days supply | Qty: 60 | Fill #1

## 2018-06-12 MED FILL — ?ATORVASTATIN 40MG TABLET: 40 | 30 days supply | Qty: 30 | Fill #2

## 2018-06-12 MED FILL — GABAPENTIN 100 MG CAPSULE: 100 | 30 days supply | Qty: 60 | Fill #1

## 2018-06-22 ENCOUNTER — Encounter: Payer: Self-pay | Admitting: Gastroenterology

## 2018-06-22 ENCOUNTER — Other Ambulatory Visit: Payer: Self-pay

## 2018-06-22 ENCOUNTER — Ambulatory Visit (AMBULATORY_SURGERY_CENTER): Payer: Self-pay | Admitting: Gastroenterology

## 2018-06-22 VITALS — BP 121/64 | HR 63 | Temp 96.6°F | Resp 17 | Ht 61.0 in | Wt 152.0 lb

## 2018-06-22 DIAGNOSIS — Z86010 Personal history of colonic polyps: Secondary | ICD-10-CM

## 2018-06-22 DIAGNOSIS — Z800 Family history of malignant neoplasm of digestive organs: Secondary | ICD-10-CM

## 2018-06-22 DIAGNOSIS — D124 Benign neoplasm of descending colon: Secondary | ICD-10-CM

## 2018-06-22 DIAGNOSIS — D122 Benign neoplasm of ascending colon: Secondary | ICD-10-CM

## 2018-06-22 MED ORDER — SODIUM CHLORIDE 0.9 % IV SOLN: 500.0000 mL | Freq: Once | INTRAVENOUS | Status: DC

## 2018-06-22 NOTE — Progress Notes (Signed)
To recovery, report to RN, VSS. 

## 2018-06-22 NOTE — Patient Instructions (Signed)
YOU HAD AN ENDOSCOPIC PROCEDURE TODAY AT THE [facility]:   Refer to the procedure report that was given to you for any specific questions about what was found during the examination.  If the procedure report does not answer your questions, please call your gastroenterologist to clarify.  If you requested that your care partner not be given the details of your procedure findings, then the procedure report has been included in a sealed envelope for you to review at your convenience later.  YOU SHOULD EXPECT: Some feelings of bloating in the abdomen. Passage of more gas than usual.  Walking can help get rid of the air that was put into your GI tract during the procedure and reduce the bloating. If you had a lower endoscopy (such as a colonoscopy or flexible sigmoidoscopy) you may notice spotting of blood in your stool or on the toilet paper. If you underwent a bowel prep for your procedure, you may not have a normal bowel movement for a few days.  Please Note:  You might notice some irritation and congestion in your nose or some drainage.  This is from the oxygen used during your procedure.  There is no need for concern and it should clear up in a day or so.  SYMPTOMS TO REPORT IMMEDIATELY:   Following lower endoscopy (colonoscopy or flexible sigmoidoscopy):  Excessive amounts of blood in the stool  Significant tenderness or worsening of abdominal pains  Swelling of the abdomen that is new, acute  Fever of 100F or higher   For urgent or emergent issues, a gastroenterologist can be reached at any hour by calling [phone].   DIET:  We do recommend a small meal at first, but then you may proceed to your regular diet.  Drink plenty of fluids but you should avoid alcoholic beverages for 24 hours.  ACTIVITY:  You should plan to take it easy for the rest of today and you should NOT DRIVE or use heavy machinery until tomorrow (because of the sedation medicines used during the test).     FOLLOW UP: Our staff will call the number listed on your records the next business day following your procedure to check on you and address any questions or concerns that you may have regarding the information given to you following your procedure. If we do not reach you, we will leave a message.  However, if you are feeling well and you are not experiencing any problems, there is no need to return our call.  We will assume that you have returned to your regular daily activities without incident.  If any biopsies were taken you will be contacted by phone or by letter within the next 1-3 weeks.  Please call us at [phone] if you have not heard about the biopsies in 3 weeks.    SIGNATURES/CONFIDENTIALITY: You and/or your care partner have signed paperwork which will be entered into your electronic medical record.  These signatures attest to the fact that that the information above on your After Visit Summary has been reviewed and is understood.  Full responsibility of the confidentiality of this discharge information lies with you and/or your care-partner.  Read all of the handouts given to you by your recovery room nurse. 

## 2018-06-22 NOTE — Op Note (Signed)
[name] Procedure Date: 06/22/2018 11:43 AM MRN: [id number] Endoscopist: Mauri Pole , MD Age: 62 Referring MD:  Date of Birth: Feb 03, 1956 Gender: Female Account #: [id number] Procedure:                Colonoscopy Indications:              Screening in patient at increased risk: Colorectal                            cancer in brother before age 24 and grandmother ,                            High risk colon cancer surveillance: Personal                            history of colonic polyps, Last colonoscopy: 2017,                            6mm Tubular adenoma removed, inadequate prep and                            prior to that in 2012, 2003 and 1997 Medicines:                Monitored Anesthesia Care Procedure:                Pre-Anesthesia Assessment:                           - Prior to the procedure, a History and Physical                            was performed, and patient medications and                            allergies were reviewed. The patient's tolerance of                            previous anesthesia was also reviewed. The risks                            and benefits of the procedure and the sedation                            options and risks were discussed with the patient.                            All questions were answered, and informed consent                            was obtained. Prior Anticoagulants: The patient has                            taken no previous anticoagulant or antiplatelet  agents. ASA Grade Assessment: II - A patient with                            mild systemic disease. After reviewing the risks                            and benefits, the patient was deemed in                            satisfactory condition to undergo the procedure.                           After obtaining informed consent, the colonoscope                            was passed under  direct vision. Throughout the                            procedure, the patient's blood pressure, pulse, and                            oxygen saturations were monitored continuously. The                            Colonoscope was introduced through the anus and                            advanced to the the terminal ileum, with                            identification of the appendiceal orifice and IC                            valve. The colonoscopy was performed without                            difficulty. The patient tolerated the procedure                            well. The quality of the bowel preparation was                            excellent. The ileocecal valve, appendiceal                            orifice, and rectum were photographed. Scope In: 11:45:07 AM Scope Out: 11:57:55 AM Scope Withdrawal Time: 0 hours 10 minutes 35 seconds  Total Procedure Duration: 0 hours 12 minutes 48 seconds  Findings:                 The perianal and digital rectal examinations were                            normal.  Three sessile polyps were found in the descending                            colon and ascending colon. The polyps were 4 to 8                            mm in size. These polyps were removed with a cold                            snare. Resection and retrieval were complete.                           Multiple small-mouthed diverticula were found in                            the sigmoid colon, descending colon, transverse                            colon and ascending colon.                           Non-bleeding internal hemorrhoids were found during                            retroflexion. The hemorrhoids were small. Complications:            No immediate complications. Estimated Blood Loss:     Estimated blood loss was minimal. Impression:               - Three 4 to 8 mm polyps in the descending colon                            and in the  ascending colon, removed with a cold                            snare. Resected and retrieved.                           - Mild diverticulosis in the sigmoid colon, in the                            descending colon, in the transverse colon and in                            the ascending colon.                           - Non-bleeding internal hemorrhoids. Recommendation:           - Patient has a contact number available for                            emergencies. The signs and symptoms of potential  delayed complications were discussed with the                            patient. Return to normal activities tomorrow.                            Written discharge instructions were provided to the                            patient.                           - Resume previous diet.                           - Continue present medications.                           - Await pathology results.                           - Repeat colonoscopy in 3 - 5 years for                            surveillance based on pathology results. Mauri Pole, MD 06/22/2018 12:06:50 PM This report has been signed electronically.

## 2018-06-22 NOTE — Progress Notes (Signed)
Called to room to assist during endoscopic procedure.  Patient ID and intended procedure confirmed with present staff. Received instructions for my participation in the procedure from the performing physician.  

## 2018-06-25 ENCOUNTER — Telehealth: Payer: Self-pay | Admitting: *Deleted

## 2018-06-25 NOTE — Telephone Encounter (Signed)
  Follow up Call-  Call back number 06/22/2018  Post procedure Call Back phone  # [phone]  Permission to leave phone message Yes  Some recent data might be hidden     Patient questions:  Do you have a fever, pain , or abdominal swelling? No. Pain Score  0 *  Have you tolerated food without any problems? Yes.    Have you been able to return to your normal activities? Yes.    Do you have any questions about your discharge instructions: Diet   No. Medications  No. Follow up visit  No.  Do you have questions or concerns about your Care? No.  Actions: * If pain score is 4 or above: No action needed, pain <4.

## 2018-06-29 ENCOUNTER — Encounter: Payer: Self-pay | Admitting: Gastroenterology

## 2018-07-06 ENCOUNTER — Ambulatory Visit: Payer: Self-pay | Attending: Internal Medicine | Admitting: Internal Medicine

## 2018-07-06 ENCOUNTER — Encounter: Payer: Self-pay | Admitting: Internal Medicine

## 2018-07-06 VITALS — BP 123/80 | HR 69 | Temp 98.3°F | Resp 16 | Wt 148.4 lb

## 2018-07-06 DIAGNOSIS — E119 Type 2 diabetes mellitus without complications: Secondary | ICD-10-CM | POA: Insufficient documentation

## 2018-07-06 DIAGNOSIS — Z885 Allergy status to narcotic agent status: Secondary | ICD-10-CM | POA: Insufficient documentation

## 2018-07-06 DIAGNOSIS — Z794 Long term (current) use of insulin: Secondary | ICD-10-CM

## 2018-07-06 DIAGNOSIS — M48062 Spinal stenosis, lumbar region with neurogenic claudication: Secondary | ICD-10-CM | POA: Insufficient documentation

## 2018-07-06 DIAGNOSIS — Z79899 Other long term (current) drug therapy: Secondary | ICD-10-CM | POA: Insufficient documentation

## 2018-07-06 DIAGNOSIS — F17210 Nicotine dependence, cigarettes, uncomplicated: Secondary | ICD-10-CM | POA: Insufficient documentation

## 2018-07-06 DIAGNOSIS — F329 Major depressive disorder, single episode, unspecified: Secondary | ICD-10-CM

## 2018-07-06 DIAGNOSIS — Z880 Allergy status to penicillin: Secondary | ICD-10-CM | POA: Insufficient documentation

## 2018-07-06 DIAGNOSIS — E785 Hyperlipidemia, unspecified: Secondary | ICD-10-CM

## 2018-07-06 DIAGNOSIS — E1165 Type 2 diabetes mellitus with hyperglycemia: Secondary | ICD-10-CM

## 2018-07-06 DIAGNOSIS — E559 Vitamin D deficiency, unspecified: Secondary | ICD-10-CM | POA: Insufficient documentation

## 2018-07-06 DIAGNOSIS — Z888 Allergy status to other drugs, medicaments and biological substances status: Secondary | ICD-10-CM | POA: Insufficient documentation

## 2018-07-06 DIAGNOSIS — IMO0001 Reserved for inherently not codable concepts without codable children: Secondary | ICD-10-CM

## 2018-07-06 DIAGNOSIS — M79604 Pain in right leg: Secondary | ICD-10-CM

## 2018-07-06 LAB — GLUCOSE, POCT (MANUAL RESULT ENTRY): POC GLUCOSE: 138 mg/dL — AB (ref 70–99)

## 2018-07-06 NOTE — Progress Notes (Signed)
Patient ID: Bailey [name], female    DOB: September 04, 1956  MRN: [id number]  CC: Diabetes   Subjective: Bailey [name] is a 62 y.o. female who presents for chronic ds management. Her concerns today include:  Pt with hx of DM, HL, OA (RT shoulder and knees), gastric ulcer, Vit D def, tob dep  Loss her mother in April and a recent in-law.  Feeling very stress and depress since mom died.  She has spoken to hospice once since the death.  She finds talking about it helps her cope with the loss.   DM: Checks blood sugars 2-3 times a day.  Gives range of A.m 150-200, bedtime 200-300 Farxiga was added on last visit.  Compliant with Farxiga, Amaryl and Lantus Not eating eating or sleeping well since the loss of her mother in April.  She attributes the higher blood sugar ranges to this.  Aching in RT leg when laying in bed or with crossing leg.  From hip, knee and into calf.  Knee pops.  No swelling or stiffness.  Had colonoscopy since last visit.  Had 3 polyps removed some of which were tubular adenomas.  Will need repeat colonoscopy in 5 years.   Patient Active Problem List   Diagnosis Date Noted  . Tobacco dependence 10/05/2017  . Spinal stenosis of lumbar region with neurogenic claudication 10/05/2017  . Osteoarthritis of spine with radiculopathy, lumbar region 10/05/2017  . Plantar wart of right foot 06/01/2017  . Right sided weakness 12/24/2015  . Dyslipidemia with low high density lipoprotein (HDL) cholesterol with hypertriglyceridemia due to type 2 diabetes mellitus ([place]) 11/16/2015  . Uncontrolled type 2 diabetes mellitus with hyperglycemia ([place]) 11/12/2015  . Menopausal symptoms 11/12/2015  . Dupuytren's contracture of both hands 07/05/2013  . Need for Tdap vaccination 07/05/2013  . Arthritis      Current Outpatient Medications on File Prior to Visit  Medication Sig Dispense Refill  . acetaminophen (TYLENOL) 500 MG tablet Take 1 tablet (500 mg total) by mouth every 6  (six) hours as needed for mild pain or moderate pain. 30 tablet 0  . atorvastatin (LIPITOR) 40 MG tablet TAKE 1 TABLET BY MOUTH DAILY. 30 tablet 2  . Blood Glucose Monitoring Suppl (TRUE METRIX METER) DEVI 1 each by Does not apply route 3 (three) times daily. 1 Device 0  . Cholecalciferol (VITAMIN D3) 2000 units TABS Take 2,000 Units by mouth daily. 30 tablet 11  . dapagliflozin propanediol (FARXIGA) 5 MG TABS tablet Take 5 mg by mouth daily. 30 tablet 5  . gabapentin (NEURONTIN) 100 MG capsule Take 2 capsules (200 mg total) by mouth at bedtime. 60 capsule 3  . glimepiride (AMARYL) 4 MG tablet Take 2 tablets (8 mg total) by mouth daily before breakfast. 60 tablet 2  . glucose blood (TRUE METRIX BLOOD GLUCOSE TEST) test strip 1 each by Other route 3 (three) times daily. 100 each 12  . glucose blood (TRUE METRIX BLOOD GLUCOSE TEST) test strip Use as instructed 100 each 12  . hydrocortisone valerate cream (WESTCORT) 0.2 % Apply 1 application topically 2 (two) times daily. (Patient not taking: Reported on 06/08/2018) 45 g 0  . insulin glargine (LANTUS) 100 unit/mL SOPN Inject 0.35 mLs (35 Units total) into the skin at bedtime. 15 mL 11  . Insulin Pen Needle (BD ULTRA-FINE PEN NEEDLES) 29G X 12.7MM MISC 1 each by Does not apply route 3 (three) times daily. 100 each 12  . Insulin Syringe-Needle U-100 (B-D INS SYRINGE 0.5CC/31GX5/16)  31G X 5/16" 0.5 ML MISC 1 each by Does not apply route at bedtime. 30 each 5  . Lancet Devices (ACCU-CHEK SOFTCLIX) lancets Use as instructed 1 each 0  . meloxicam (MOBIC) 15 MG tablet TAKE 1 TABLET BY MOUTH DAILY. 30 tablet 0  . methocarbamol (ROBAXIN) 500 MG tablet Take 1 tablet (500 mg total) by mouth every 8 (eight) hours as needed for muscle spasms. (Patient not taking: Reported on 06/08/2018) 30 tablet 0  . Multiple Vitamins-Minerals (MULTIVITAMIN WITH MINERALS) tablet Take 1 tablet by mouth every other day.     . sitaGLIPtin (JANUVIA) 100 MG tablet Take 1 tablet (100 mg  total) by mouth daily. 90 tablet 3  . TRUEPLUS LANCETS 28G MISC 1 each by Does not apply route 3 (three) times daily. 100 each 11  . VIVELLE-DOT 0.025 MG/24HR PLACE 1 PATCH ONTO THE SKIN 2 TIMES A WEEK. 8 patch 12   Current Facility-Administered Medications on File Prior to Visit  Medication Dose Route Frequency Provider Last Rate Last Dose  . 0.9 %  sodium chloride infusion  500 mL Intravenous Once Nandigam, Venia Minks, MD        Allergies  Allergen Reactions  . Metformin And Related Diarrhea  . Shellfish Allergy Anaphylaxis, Hives and Swelling    Lobster, crab (shrimp's ok)  . Ivp Dye [Iodinated Diagnostic Agents] Hives  . Adhesive [Tape] Other (See Comments)    blisters  . Chantix [Varenicline] Other (See Comments)    depression  . Codeine Other (See Comments)    Unknown childhood allergy  . Hydrocodone-Acetaminophen   . Latex Other (See Comments)    blisters  . Penicillins Other (See Comments)    Unknown childhood reaction    Social History   Socioeconomic History  . Marital status: Married    Spouse name: Louie Casa  . Number of children: 0  . Years of education: 77  . Highest education level: Not on file  Occupational History  . Occupation: Dispensing optician: [employer]     Comment: Civil engineer, contracting  Social Needs  . Financial resource strain: Not on file  . Food insecurity:    Worry: Not on file    Inability: Not on file  . Transportation needs:    Medical: Not on file    Non-medical: Not on file  Tobacco Use  . Smoking status: Current Every Day Smoker    Packs/day: 0.25    Years: 28.00    Pack years: 7.00    Types: Cigarettes  . Smokeless tobacco: Never Used  Substance and Sexual Activity  . Alcohol use: No    Alcohol/week: 0.0 oz  . Drug use: No  . Sexual activity: Yes    Partners: Male    Birth control/protection: Surgical  Lifestyle  . Physical activity:    Days per week: Not on file    Minutes per session: Not on file  .  Stress: Not on file  Relationships  . Social connections:    Talks on phone: Not on file    Gets together: Not on file    Attends religious service: Not on file    Active member of club or organization: Not on file    Attends meetings of clubs or organizations: Not on file    Relationship status: Not on file  . Intimate partner violence:    Fear of current or ex partner: Not on file    Emotionally abused: Not on file    Physically  abused: Not on file    Forced sexual activity: Not on file  Other Topics Concern  . Not on file  Social History Narrative   Currently living alone, separated from husband.  One story home.  [name]'s children are adults-2 live in [place], 2 live in [place].     Works for a [employer].     Education: 2 years of college.    Family History  Problem Relation Age of Onset  . Diabetes Mother   . Hypertension Mother   . Dementia Mother   . Pancreatitis Mother   . Asthma Mother   . COPD Mother   . Stroke Mother   . Hyperlipidemia Mother   . Heart disease Mother   . Hypertension Father   . Hypertension Sister   . Diabetes Sister   . Hyperlipidemia Sister   . Hypertension Brother   . Diabetes Brother   . Drug abuse Brother   . Cancer Brother        colon  . Hyperlipidemia Brother   . Colon cancer Brother 50  . Hyperlipidemia Brother   . Hypertension Brother   . Hyperlipidemia Sister   . Hypertension Sister   . Diabetes Sister   . Diabetes Maternal Grandmother   . Colon cancer Maternal Grandmother   . Breast cancer Neg Hx   . Esophageal cancer Neg Hx   . Stomach cancer Neg Hx     Past Surgical History:  Procedure Laterality Date  . COLONOSCOPY  last one 07-15-2016  . ESOPHAGOGASTRODUODENOSCOPY  last one 2008  . FASCIECTOMY Left 08/24/2016   Procedure: LEFT HAND PALMER AND SMALL FINGER DIGiTAL FASCIECTOMY AND RELEASE;  Surgeon: Iran Planas, MD;  Location: [facility];  Service: Orthopedics;  Laterality: Left;  .  LAPAROSCOPIC CHOLECYSTECTOMY  07/17/2000  . ROTATOR CUFF REPAIR Right 2000  . TOTAL ABDOMINAL HYSTERECTOMY W/ BILATERAL SALPINGOOPHORECTOMY  1998    ROS: Review of Systems Negative except as above. PHYSICAL EXAM: BP 123/80   Pulse 69   Temp 98.3 F (36.8 C) (Oral)   Resp 16   Wt 148 lb 6.4 oz (67.3 kg)   SpO2 99%   BMI 28.04 kg/m   Wt Readings from Last 3 Encounters:  07/06/18 148 lb 6.4 oz (67.3 kg)  06/22/18 152 lb (68.9 kg)  06/08/18 148 lb (67.1 kg)    Physical Exam  General appearance - alert, well appearing, and in no distress Mental status -patient a bit tearful today when talking about a month. Chest - clear to auscultation, no wheezes, rales or rhonchi, symmetric air entry Heart - normal rate, regular rhythm, normal S1, S2, no murmurs, rubs, clicks or gallops Musculoskeletal -right knee: No edema or erythema.  Good range of motion.  Gait is stable.   Extremities - peripheral pulses normal, no pedal edema, no clubbing or cyanosis   Results for orders placed or performed in visit on 07/06/18  POCT glucose (manual entry)  Result Value Ref Range   POC Glucose 138 (A) 70 - 99 mg/dl    ASSESSMENT AND PLAN: 1. Uncontrolled type 2 diabetes mellitus without complication, with long-term current use of insulin ([place]) Patient plans to get back on track with her eating habits and trying to exercise.  She declines any changes or increases in her medications.   - POCT glucose (manual entry) - Microalbumin / creatinine urine ratio - CBC - Comprehensive metabolic panel - Lipid panel - Hemoglobin A1c  2. Reactive depression Declines any medication at  this time.  I have encouraged her to go to support groups offered by hospice.  She is agreeable to having a phone call from our LCSW  3. Hyperlipidemia, unspecified hyperlipidemia type Continue atorvastatin.  4. Pain of right lower extremity We will monitor for now.  She is on meloxicam.  Patient was given the opportunity  to ask questions.  Patient verbalized understanding of the plan and was able to repeat key elements of the plan.   Orders Placed This Encounter  Procedures  . Microalbumin / creatinine urine ratio  . CBC  . Comprehensive metabolic panel  . Lipid panel  . Hemoglobin A1c  . POCT glucose (manual entry)     Requested Prescriptions    No prescriptions requested or ordered in this encounter    Return in about 2 months (around 09/06/2018) for physical.  Karle Plumber, MD, [facility]

## 2018-07-06 NOTE — Patient Instructions (Signed)

## 2018-07-07 LAB — CBC
HEMOGLOBIN: 14.1 g/dL (ref 11.1–15.9)
Hematocrit: 40.3 % (ref 34.0–46.6)
MCH: 31.7 pg (ref 26.6–33.0)
MCHC: 35 g/dL (ref 31.5–35.7)
MCV: 91 fL (ref 79–97)
Platelets: 117 10*3/uL — ABNORMAL LOW (ref 150–450)
RBC: 4.45 x10E6/uL (ref 3.77–5.28)
RDW: 14.2 % (ref 12.3–15.4)
WBC: 6.6 10*3/uL (ref 3.4–10.8)

## 2018-07-07 LAB — LIPID PANEL
CHOLESTEROL TOTAL: 99 mg/dL — AB (ref 100–199)
Chol/HDL Ratio: 2.6 ratio (ref 0.0–4.4)
HDL: 38 mg/dL — AB (ref >39)
LDL CALC: 46 mg/dL (ref 0–99)
TRIGLYCERIDES: 76 mg/dL (ref 0–149)
VLDL CHOLESTEROL CAL: 15 mg/dL (ref 5–40)

## 2018-07-07 LAB — COMPREHENSIVE METABOLIC PANEL
ALBUMIN: 4.1 g/dL (ref 3.6–4.8)
ALK PHOS: 86 IU/L (ref 39–117)
ALT: 41 IU/L — ABNORMAL HIGH (ref 0–32)
AST: 28 IU/L (ref 0–40)
Albumin/Globulin Ratio: 1.5 (ref 1.2–2.2)
BUN/Creatinine Ratio: 11 — ABNORMAL LOW (ref 12–28)
BUN: 9 mg/dL (ref 8–27)
Bilirubin Total: 0.3 mg/dL (ref 0.0–1.2)
CO2: 25 mmol/L (ref 20–29)
CREATININE: 0.79 mg/dL (ref 0.57–1.00)
Calcium: 9.3 mg/dL (ref 8.7–10.3)
Chloride: 106 mmol/L (ref 96–106)
GFR calc Af Amer: 91 mL/min/{1.73_m2} (ref >59)
GFR calc non Af Amer: 79 mL/min/{1.73_m2} (ref >59)
GLUCOSE: 143 mg/dL — AB (ref 65–99)
Globulin, Total: 2.8 g/dL (ref 1.5–4.5)
Potassium: 3.8 mmol/L (ref 3.5–5.2)
Sodium: 145 mmol/L — ABNORMAL HIGH (ref 134–144)
Total Protein: 6.9 g/dL (ref 6.0–8.5)

## 2018-07-07 LAB — HEMOGLOBIN A1C
Est. average glucose Bld gHb Est-mCnc: 229 mg/dL
HEMOGLOBIN A1C: 9.6 % — AB (ref 4.8–5.6)

## 2018-07-07 LAB — MICROALBUMIN / CREATININE URINE RATIO
Creatinine, Urine: 216.3 mg/dL
MICROALB/CREAT RATIO: 2.6 mg/g{creat} (ref 0.0–30.0)
Microalbumin, Urine: 5.7 ug/mL

## 2018-07-11 ENCOUNTER — Telehealth: Payer: Self-pay

## 2018-07-11 MED FILL — $LANTUS SOLOSTAR 100 UNITS/: 100 | 34 days supply | Qty: 12 | Fill #7

## 2018-07-11 MED FILL — ?GLIMEPIRIDE 4 MG TABLET: 4 | 30 days supply | Qty: 60 | Fill #2

## 2018-07-11 NOTE — Telephone Encounter (Signed)
Contacted pt to go over lab results pt is aware of results and doesn't have any questions or concerns 

## 2018-07-24 MED FILL — TRUE METRIX TEST STRIP: 15 days supply | Qty: 50 | Fill #5

## 2018-08-01 MED FILL — GABAPENTIN 100 MG CAPSULE: 100 | 30 days supply | Qty: 60 | Fill #2

## 2018-08-20 ENCOUNTER — Other Ambulatory Visit: Payer: Self-pay | Admitting: Internal Medicine

## 2018-08-20 DIAGNOSIS — E1165 Type 2 diabetes mellitus with hyperglycemia: Secondary | ICD-10-CM

## 2018-09-06 ENCOUNTER — Ambulatory Visit: Payer: No Typology Code available for payment source | Admitting: Internal Medicine

## 2018-09-21 ENCOUNTER — Other Ambulatory Visit: Payer: Self-pay | Admitting: Internal Medicine

## 2018-09-21 ENCOUNTER — Other Ambulatory Visit: Payer: Self-pay | Admitting: Physician Assistant

## 2018-09-21 DIAGNOSIS — E1169 Type 2 diabetes mellitus with other specified complication: Secondary | ICD-10-CM

## 2018-09-21 DIAGNOSIS — E782 Mixed hyperlipidemia: Principal | ICD-10-CM

## 2018-09-21 DIAGNOSIS — E1165 Type 2 diabetes mellitus with hyperglycemia: Secondary | ICD-10-CM

## 2018-09-21 MED FILL — GLIMEPIRIDE 4 MG TABS: 4 | 30 days supply | Qty: 60 | Fill #0

## 2018-09-21 MED FILL — ATORVASTATIN 40 MG TABLET: 40 | 30 days supply | Qty: 30 | Fill #0

## 2018-09-21 MED FILL — !FARXIGA 5 MG TABLET: 5 | 30 days supply | Qty: 30 | Fill #2

## 2018-09-21 MED FILL — $LANTUS SOLOSTAR 100 UNITS/: 100 | 42 days supply | Qty: 15 | Fill #0

## 2018-09-21 MED FILL — GABAPENTIN 100 MG CAPSULE: 100 | 30 days supply | Qty: 60 | Fill #3

## 2018-09-28 ENCOUNTER — Other Ambulatory Visit: Payer: Self-pay | Admitting: Internal Medicine

## 2018-09-28 DIAGNOSIS — E1165 Type 2 diabetes mellitus with hyperglycemia: Secondary | ICD-10-CM

## 2019-01-07 IMAGING — MR MR LUMBAR SPINE W/O CM
4 of 5 series · 19 of 48 positions shown · non-contrast
Comparison: Lumbar spine radiographs 08/22/2015

CLINICAL DATA: Low back pain radiating into the right leg to the
knee. Right leg weakness and paresthesias.

EXAM:
MRI LUMBAR SPINE WITHOUT CONTRAST
TECHNIQUE: Multiplanar, multisequence MR imaging of the lumbar spine was
performed. No intravenous contrast was administered.

[Series 2: T2 · sagittal · 4.0mm · 0.55mm/px · 6 of 12 slices shown (1 of 2)]
[im 1/12]
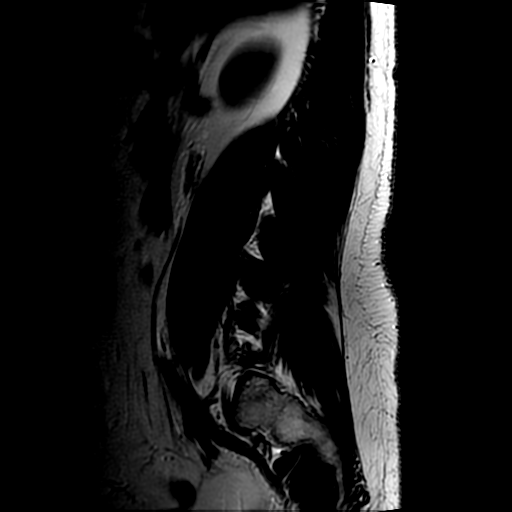
[im 3/12]
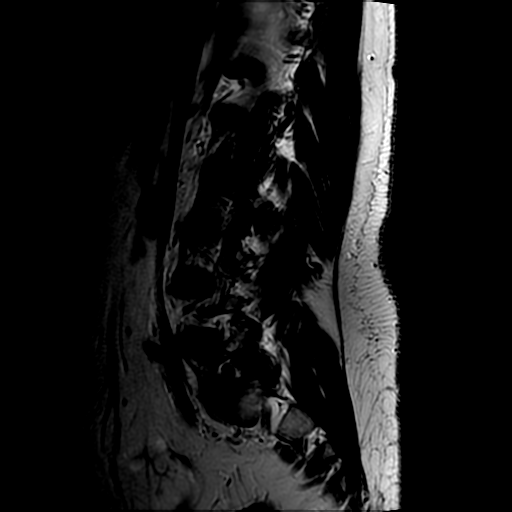
[im 5/12]
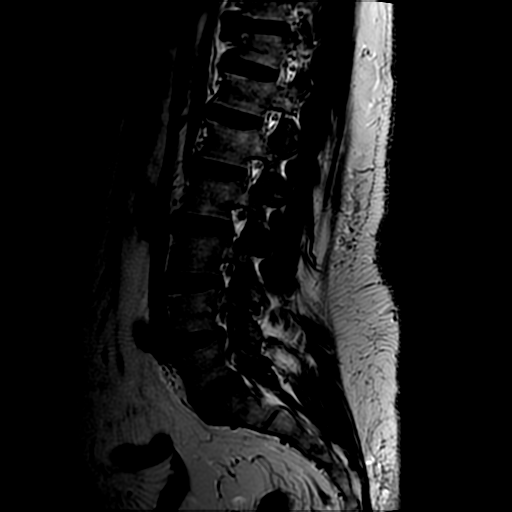
[im 7/12]
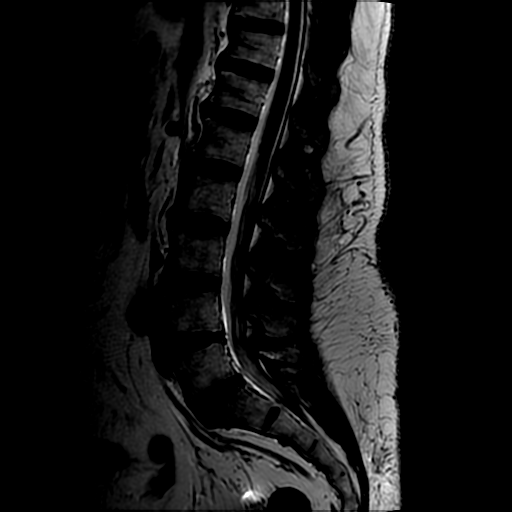
[im 9/12]
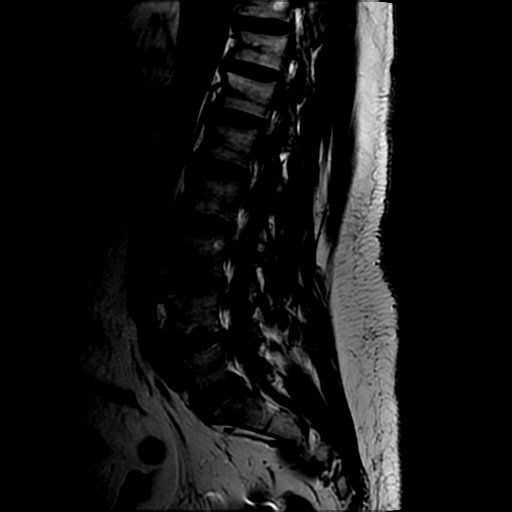
[im 12/12]
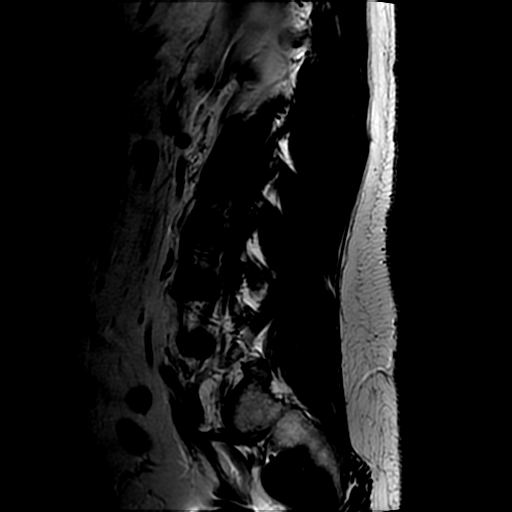

[Series 4: T1 · sagittal · 4.0mm · 0.55mm/px · 3 of 12 slices shown (1 of 2)]
[im 1/12]
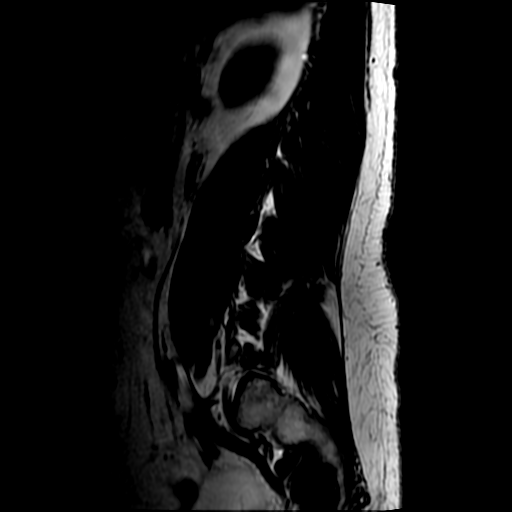
[im 6/12]
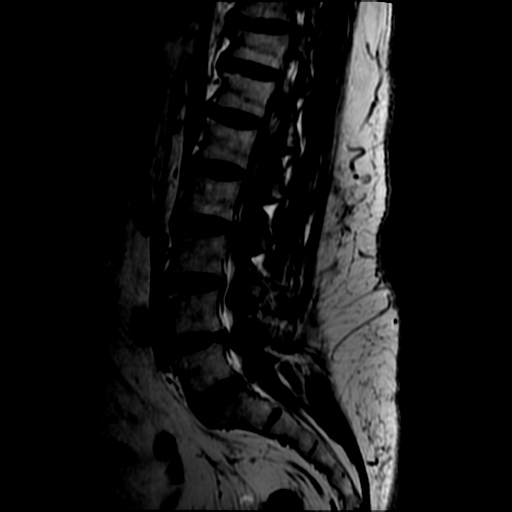
[im 12/12]
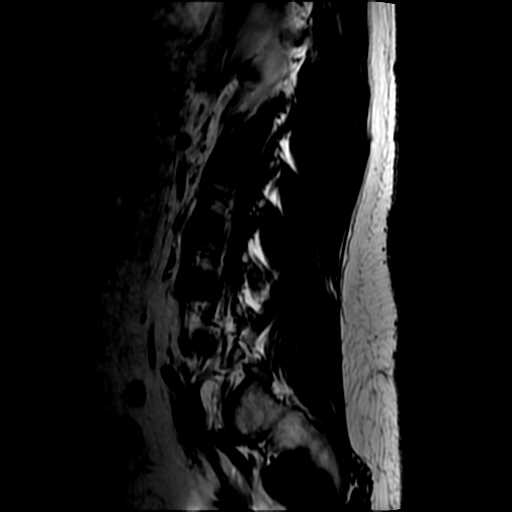

[Series 5: T2 · axial · 4.0mm · 0.39mm/px · z∈[-54,+91]mm · 7 of 37 slices shown (2 of 2)]
[im 3/37]
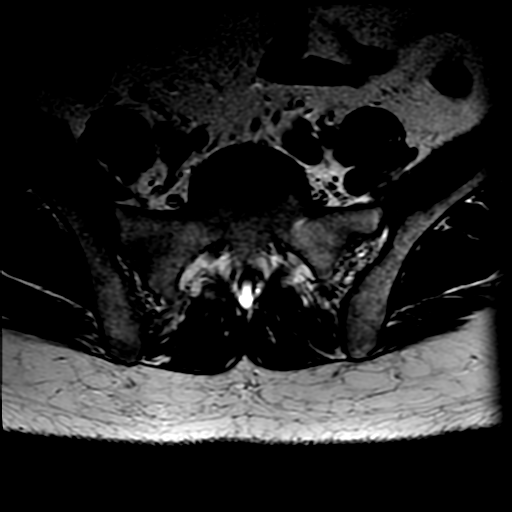
[im 5/37]
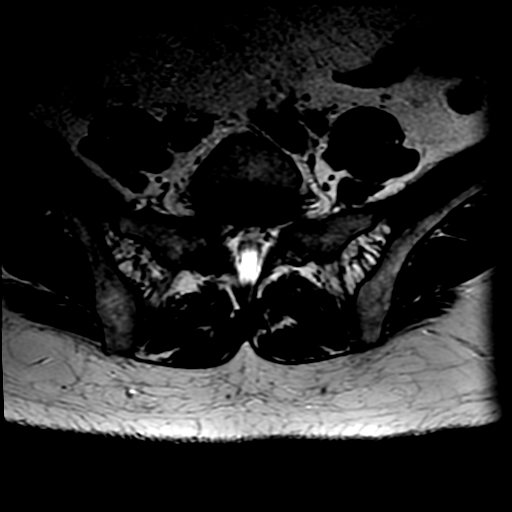
[im 8/37]
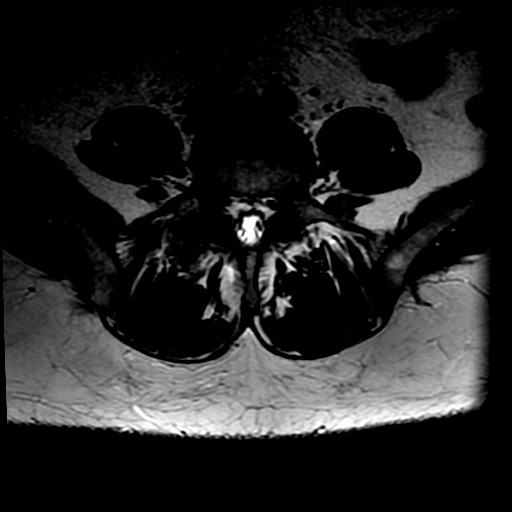
[im 13/37]
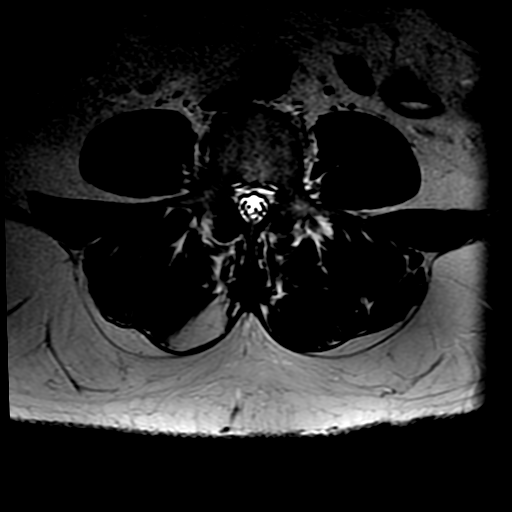
[im 17/37]
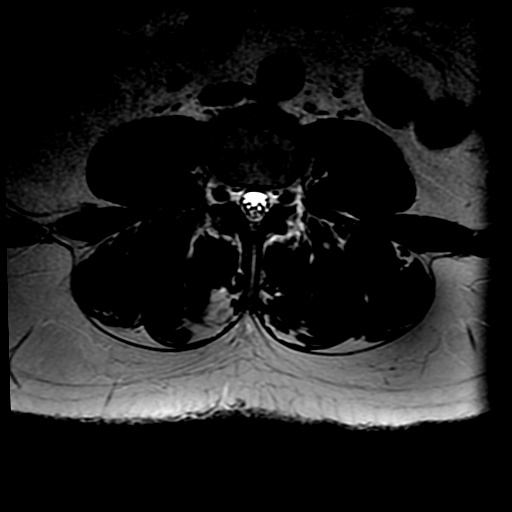
[im 20/37]
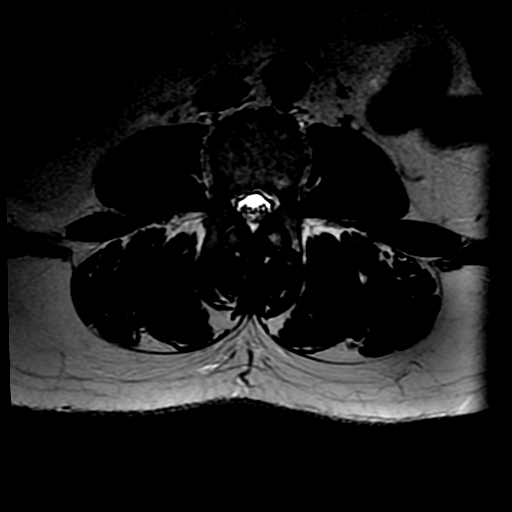
[im 32/37]
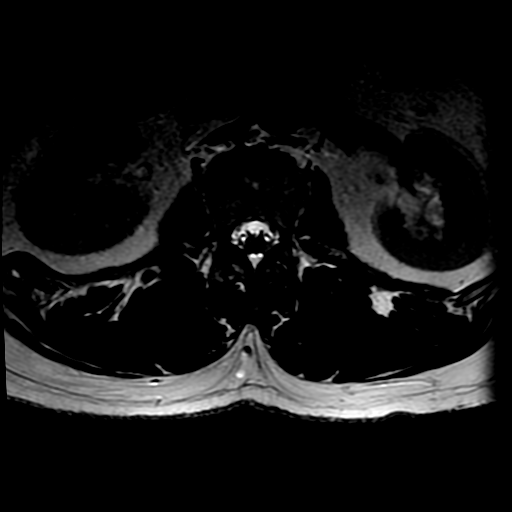

[Series 6: T1 · axial · 4.0mm · 0.39mm/px · z∈[-44,+91]mm · 3 of 37 slices shown (2 of 2)]
[im 5/37]
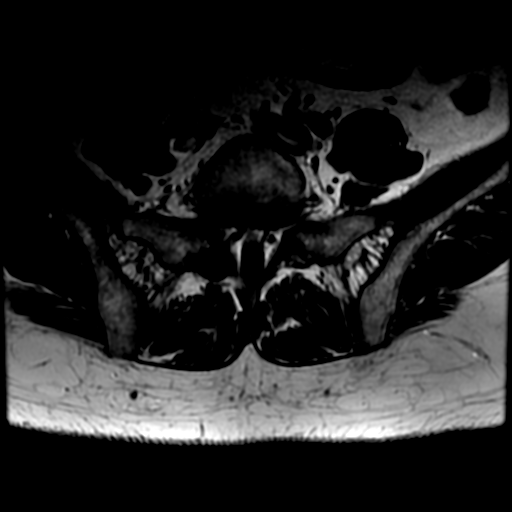
[im 20/37]
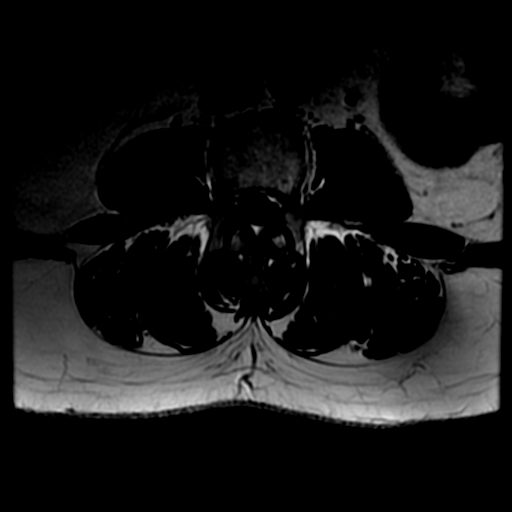
[im 32/37]
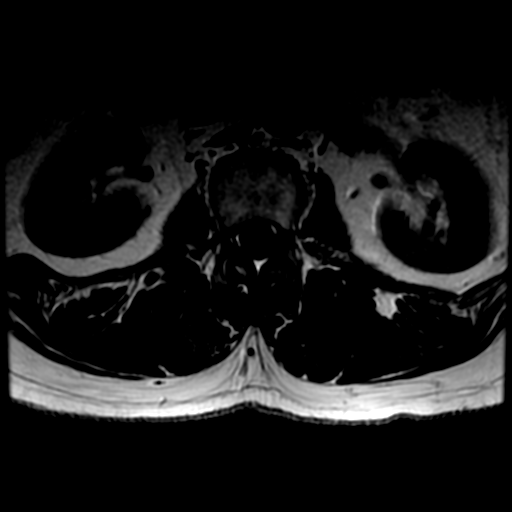

[19 of 48 positions shown; findings below may reference images not displayed]

FINDINGS: Segmentation:  Standard.

Alignment:  Normal.

Vertebrae: No fracture, osseous lesion, or significant marrow edema.

Conus medullaris: Extends to the L1-2 level and appears normal.

Paraspinal and other soft tissues: Susceptibility artifact in the
pelvis related to surgical clips.

Disc levels:

Disc desiccation throughout the lumbar spine. Preserved disc space
heights aside from minimal narrowing at L4-5.

T12-L1 through L3-4:  Negative.

L4-5: Mild disc bulging greater to the right and mild facet
arthrosis without significant stenosis.

L5-S1: Severe right and mild left facet arthrosis and minimal disc
bulging result in mild bilateral neural foraminal stenosis. No
spinal stenosis.
IMPRESSION: 1. Severe right and mild left facet arthrosis at L5-S1 with mild
bilateral neural foraminal stenosis.
2. Mild disc and facet degeneration at L4-5 without stenosis.

## 2019-07-17 ENCOUNTER — Other Ambulatory Visit: Payer: Self-pay

## 2019-07-17 ENCOUNTER — Ambulatory Visit (HOSPITAL_COMMUNITY)
Admission: EM | Admit: 2019-07-17 | Discharge: 2019-07-17 | Disposition: A | Discharge status: Home / Self Care | Payer: Medicare Other | Attending: Emergency Medicine | Admitting: Emergency Medicine

## 2019-07-17 ENCOUNTER — Encounter (HOSPITAL_COMMUNITY): Payer: Self-pay

## 2019-07-17 DIAGNOSIS — M795 Residual foreign body in soft tissue: Secondary | ICD-10-CM | POA: Diagnosis not present

## 2019-07-17 DIAGNOSIS — S30861A Insect bite (nonvenomous) of abdominal wall, initial encounter: Secondary | ICD-10-CM

## 2019-07-17 DIAGNOSIS — H9203 Otalgia, bilateral: Secondary | ICD-10-CM

## 2019-07-17 DIAGNOSIS — W57XXXA Bitten or stung by nonvenomous insect and other nonvenomous arthropods, initial encounter: Secondary | ICD-10-CM | POA: Diagnosis not present

## 2019-07-17 MED ORDER — BACITRACIN ZINC 500 UNIT/GM EX OINT
TOPICAL_OINTMENT | CUTANEOUS | Status: AC
  Filled 2019-07-17: qty 0.9

## 2019-07-17 NOTE — Discharge Instructions (Addendum)
The cactus thorn in your left great toe was partially removed today.  Hopefully, the rest of the thorn will work its way to the surface on its own.  Watch for signs of infection such as increased pain, redness, pus, warmth, fever.  If you develop signs of infection, return here or go to the emergency department.  Keep the wound clean and dry.  Apply antibiotic cream twice a day with a bandaid.   The itching spot on your side appears to be an insect bite.  Use Benadryl cream and antibiotic cream as needed.  Stop scratching.    Your ear exam was normal today.

## 2019-07-17 NOTE — ED Triage Notes (Signed)
Pt states she did a ear wax removal and  Now both ears are ringing and itching. X 3 weeks ago. Pt states she has a piece of castus throne in her toe on her left foot toes.

## 2019-07-17 NOTE — ED Provider Notes (Signed)
[place]    CSN: [id number] Arrival date & time: 07/17/19  0848     History   Chief Complaint Chief Complaint  Patient presents with  . Otalgia    HPI Bailey [name] is a 63 y.o. female.   [place] with cactus thorn in her left great toe x2 days.  She also thinks she may have a cactus thorn in her left fourth toe.  She reports an itching sore on her right side of her abdomen.  She also reports both ears are itching x3 weeks.  She denies fever, chills, sore throat, cough, shortness of breath, abdominal pain, vomiting, diarrhea.     HPI  Past Medical History:  Diagnosis Date  . Arthritis    RIGHT shoulder, knees  . Cervical radiculopathy at C5   . Dupuytren's contracture of both hands    L > R  . Dyslipidemia   . History of adenomatous polyp of colon    1997 and 2003    . History of chronic pelvic inflammatory disease   . History of gastric ulcer    2008  . Hypertriglyceridemia   . Right carpal tunnel syndrome   . Type 2 diabetes mellitus, uncontrolled ([place])    last A1c 8.9 on 07-07-2016  . Vitamin D deficiency     Patient Active Problem List   Diagnosis Date Noted  . Tobacco dependence 10/05/2017  . Spinal stenosis of lumbar region with neurogenic claudication 10/05/2017  . Osteoarthritis of spine with radiculopathy, lumbar region 10/05/2017  . Plantar wart of right foot 06/01/2017  . Right sided weakness 12/24/2015  . Dyslipidemia with low high density lipoprotein (HDL) cholesterol with hypertriglyceridemia due to type 2 diabetes mellitus ([place]) 11/16/2015  . Uncontrolled type 2 diabetes mellitus with hyperglycemia ([place]) 11/12/2015  . Menopausal symptoms 11/12/2015  . Dupuytren's contracture of both hands 07/05/2013  . Need for Tdap vaccination 07/05/2013  . Arthritis     Past Surgical History:  Procedure Laterality Date  . COLONOSCOPY  last one 07-15-2016  . ESOPHAGOGASTRODUODENOSCOPY  last one 2008  . FASCIECTOMY Left 08/24/2016   Procedure: LEFT HAND PALMER AND SMALL FINGER DIGiTAL FASCIECTOMY AND RELEASE;  Surgeon: Iran Planas, MD;  Location: [facility];  Service: Orthopedics;  Laterality: Left;  . LAPAROSCOPIC CHOLECYSTECTOMY  07/17/2000  . ROTATOR CUFF REPAIR Right 2000  . TOTAL ABDOMINAL HYSTERECTOMY W/ BILATERAL SALPINGOOPHORECTOMY  1998    OB History   No obstetric history on file.      Home Medications    Prior to Admission medications   Medication Sig Start Date End Date Taking? Authorizing Provider  acetaminophen (TYLENOL) 500 MG tablet Take 1 tablet (500 mg total) by mouth every 6 (six) hours as needed for mild pain or moderate pain. 07/27/17   Waynetta Pean, PA-C  atorvastatin (LIPITOR) 40 MG tablet TAKE 1 TABLET BY MOUTH DAILY. 09/21/18   Ladell Pier, MD  Blood Glucose Monitoring Suppl (TRUE METRIX METER) DEVI 1 each by Does not apply route 3 (three) times daily. 04/19/17   Funches, Adriana Mccallum, MD  Cholecalciferol (VITAMIN D3) 2000 units TABS Take 2,000 Units by mouth daily. 10/21/16   Funches, Adriana Mccallum, MD  dapagliflozin propanediol (FARXIGA) 5 MG TABS tablet Take 5 mg by mouth daily. 04/09/18   Ladell Pier, MD  gabapentin (NEURONTIN) 100 MG capsule Take 2 capsules (200 mg total) by mouth at bedtime. 04/09/18   Ladell Pier, MD  glimepiride (AMARYL) 4 MG tablet TAKE 2 TABLETS (8  MG TOTAL) BY MOUTH DAILY BEFORE BREAKFAST. 08/20/18   Ladell Pier, MD  glucose blood (TRUE METRIX BLOOD GLUCOSE TEST) test strip 1 each by Other route 3 (three) times daily. 04/19/17   Boykin Nearing, MD  glucose blood test strip Use as instructed 09/28/18   Ladell Pier, MD  hydrocortisone valerate cream (WESTCORT) 0.2 % Apply 1 application topically 2 (two) times daily. Patient not taking: Reported on 06/08/2018 06/06/17   Boykin Nearing, MD  Insulin Glargine (LANTUS SOLOSTAR) 100 UNIT/ML Solostar Pen Inject 35 Units into the skin at bedtime. MUST MAKE APPT FOR FURTHER REFILLS 09/21/18    Ladell Pier, MD  Insulin Pen Needle (BD ULTRA-FINE PEN NEEDLES) 29G X 12.7MM MISC 1 each by Does not apply route 3 (three) times daily. 08/25/17   Argentina Donovan, PA-C  Insulin Syringe-Needle U-100 (B-D INS SYRINGE 0.5CC/31GX5/16) 31G X 5/16" 0.5 ML MISC 1 each by Does not apply route at bedtime. 08/23/17   Argentina Donovan, PA-C  Lancet Devices (ACCU-CHEK [place]) lancets Use as instructed 06/15/16   Boykin Nearing, MD  meloxicam (MOBIC) 15 MG tablet TAKE 1 TABLET BY MOUTH DAILY. 03/20/18   Gildardo Pounds, NP  methocarbamol (ROBAXIN) 500 MG tablet Take 1 tablet (500 mg total) by mouth every 8 (eight) hours as needed for muscle spasms. Patient not taking: Reported on 06/08/2018 09/19/17   Argentina Donovan, PA-C  Multiple Vitamins-Minerals (MULTIVITAMIN WITH MINERALS) tablet Take 1 tablet by mouth every other day.     [provider]  sitaGLIPtin (JANUVIA) 100 MG tablet Take 1 tablet (100 mg total) by mouth daily. 04/05/17   Funches, Adriana Mccallum, MD  TRUEPLUS LANCETS 28G MISC 1 each by Does not apply route 3 (three) times daily. 04/19/17   Boykin Nearing, MD  VIVELLE-DOT 0.025 MG/24HR PLACE 1 PATCH ONTO THE SKIN 2 TIMES A WEEK. 04/06/17   Woodroe Mode, MD    Family History Family History  Problem Relation Age of Onset  . Diabetes Mother   . Hypertension Mother   . Dementia Mother   . Pancreatitis Mother   . Asthma Mother   . COPD Mother   . Stroke Mother   . Hyperlipidemia Mother   . Heart disease Mother   . Hypertension Father   . Hypertension Sister   . Diabetes Sister   . Hyperlipidemia Sister   . Hypertension Brother   . Diabetes Brother   . Drug abuse Brother   . Cancer Brother        colon  . Hyperlipidemia Brother   . Colon cancer Brother 43  . Hyperlipidemia Brother   . Hypertension Brother   . Hyperlipidemia Sister   . Hypertension Sister   . Diabetes Sister   . Diabetes Maternal Grandmother   . Colon cancer Maternal Grandmother   . Breast cancer  Neg Hx   . Esophageal cancer Neg Hx   . Stomach cancer Neg Hx     Social History Social History   Tobacco Use  . Smoking status: Current Every Day Smoker    Packs/day: 0.25    Years: 28.00    Pack years: 7.00    Types: Cigarettes  . Smokeless tobacco: Never Used  Substance Use Topics  . Alcohol use: No    Alcohol/week: 0.0 standard drinks  . Drug use: No     Allergies   Metformin and related, Shellfish allergy, Ivp dye [iodinated diagnostic agents], Adhesive [tape], Chantix [varenicline], Codeine, Hydrocodone-acetaminophen, Latex, and Penicillins  Review of Systems Review of Systems  Constitutional: Negative for chills and fever.  HENT: Positive for ear pain. Negative for congestion, rhinorrhea and sore throat.   Eyes: Negative for pain and visual disturbance.  Respiratory: Negative for cough and shortness of breath.   Cardiovascular: Negative for chest pain and palpitations.  Gastrointestinal: Negative for abdominal pain, diarrhea and vomiting.  Genitourinary: Negative for dysuria and hematuria.  Musculoskeletal: Negative for arthralgias and back pain.  Skin: Positive for wound. Negative for color change and rash.  Neurological: Negative for seizures and syncope.  All other systems reviewed and are negative.    Physical Exam Triage Vital Signs ED Triage Vitals  Enc Vitals Group     BP 07/17/19 0925 110/79     Pulse Rate 07/17/19 0925 78     Resp 07/17/19 0925 18     Temp 07/17/19 0925 97.8 F (36.6 C)     Temp Source 07/17/19 0925 Oral     SpO2 07/17/19 0925 98 %     Weight 07/17/19 0924 132 lb (59.9 kg)     Height --      Head Circumference --      Peak Flow --      Pain Score 07/17/19 0923 3     Pain Loc --      Pain Edu? --      Excl. in [place]? --    No data found.  Updated Vital Signs BP 110/79 (BP Location: Right Arm)   Pulse 78   Temp 97.8 F (36.6 C) (Oral)   Resp 18   Wt 132 lb (59.9 kg)   SpO2 98%   BMI 24.94 kg/m   Visual Acuity  Right Eye Distance:   Left Eye Distance:   Bilateral Distance:    Right Eye Near:   Left Eye Near:    Bilateral Near:     Physical Exam Vitals signs and nursing note reviewed.  Constitutional:      General: She is not in acute distress.    Appearance: She is well-developed.  HENT:     Head: Normocephalic and atraumatic.     Right Ear: Tympanic membrane and ear canal normal.     Left Ear: Tympanic membrane and ear canal normal.     Nose: Nose normal.     Mouth/Throat:     Mouth: Mucous membranes are moist.     Pharynx: Oropharynx is clear.  Eyes:     Conjunctiva/sclera: Conjunctivae normal.  Neck:     Musculoskeletal: Neck supple.  Cardiovascular:     Rate and Rhythm: Normal rate and regular rhythm.  Pulmonary:     Effort: Pulmonary effort is normal. No respiratory distress.     Breath sounds: Normal breath sounds.  Abdominal:     Palpations: Abdomen is soft.     Tenderness: There is no abdominal tenderness. There is no guarding or rebound.  Skin:    General: Skin is warm and dry.     Comments: Left great toe: 1 mm wound with apparent foreign body.  No erythema, drainage, streaks.   Left fourth toe: No foreign body noted. Right lateral abdomen: 1 cm erythematous duration with central lesion; no foreign body; no drainage.  Neurological:     Mental Status: She is alert.      UC Treatments / Results  Labs (all labs ordered are listed, but only abnormal results are displayed) Labs Reviewed - No data to display  EKG   Radiology No results found.  Procedures Foreign Body Removal  Date/Time: 07/17/2019 10:03 AM Performed by: Sharion Balloon, NP Authorized by: Sharion Balloon, NP   Consent:    Consent obtained:  Verbal   Consent given by:  Patient Location:    Location:  Toe   Toe location:  L great toe Anesthesia (see MAR for exact dosages):    Anesthesia method:  Topical application Procedure details:    Scalpel size:  11   Removal mechanism:  Forceps    Intact foreign body removal: no   Post-procedure details:    Neurovascular status: intact     Confirmation:  Residual foreign bodies remain   Skin closure:  None   Dressing:  Antibiotic ointment and non-adherent dressing   Patient tolerance of procedure:  Tolerated well, no immediate complications   (including critical care time)  Medications Ordered in UC Medications  bacitracin 500 UNIT/GM ointment (has no administration in time range)    Initial Impression / Assessment and Plan / UC Course  I have reviewed the triage vital signs and the nursing notes.  Pertinent labs & imaging results that were available during my care of the patient were reviewed by me and considered in my medical decision making (see chart for details).   Foreign body in left great toe.  Able to remove part of the cactus thorn; part still remains.  Discussed with patient that this will likely work its way to the surface without intervention.  Discussed wound care and signs of infection with patient; instructed her to return here or go to the emergency department if she sees signs of infection.   Insect bite on right lateral abdomen.  Instructed patient to apply over-the-counter antibiotic cream and Benadryl cream as needed.  No indication of foreign body or infection.   Bilateral ear pain.  Ear exam normal today.    Final Clinical Impressions(s) / UC Diagnoses   Final diagnoses:  Foreign body (FB) in soft tissue  Insect bite of abdominal wall, initial encounter  Otalgia of both ears     Discharge Instructions     The cactus thorn in your left great toe was partially removed today.  Hopefully, the rest of the thorn will work its way to the surface on its own.  Watch for signs of infection such as increased pain, redness, pus, warmth, fever.  If you develop signs of infection, return here or go to the emergency department.  Keep the wound clean and dry.  Apply antibiotic cream twice a day with a bandaid.   The  itching spot on your side appears to be an insect bite.  Use Benadryl cream and antibiotic cream as needed.  Stop scratching.    Your ear exam was normal today.        ED Prescriptions    None     Controlled Substance Prescriptions [place] Controlled Substance Registry consulted? Not Applicable   Sharion Balloon, NP 07/17/19 1018

## 2019-08-21 ENCOUNTER — Other Ambulatory Visit: Payer: Self-pay

## 2019-08-21 ENCOUNTER — Ambulatory Visit: Payer: Medicare Other | Attending: Family Medicine | Admitting: Physician Assistant

## 2019-08-21 VITALS — BP 125/73 | HR 80 | Temp 98.5°F | Resp 18 | Ht 62.0 in | Wt 129.0 lb

## 2019-08-21 DIAGNOSIS — E1165 Type 2 diabetes mellitus with hyperglycemia: Secondary | ICD-10-CM

## 2019-08-21 DIAGNOSIS — E782 Mixed hyperlipidemia: Secondary | ICD-10-CM

## 2019-08-21 DIAGNOSIS — R2681 Unsteadiness on feet: Secondary | ICD-10-CM

## 2019-08-21 DIAGNOSIS — E1169 Type 2 diabetes mellitus with other specified complication: Secondary | ICD-10-CM | POA: Diagnosis not present

## 2019-08-21 DIAGNOSIS — Z794 Long term (current) use of insulin: Secondary | ICD-10-CM

## 2019-08-21 DIAGNOSIS — IMO0001 Reserved for inherently not codable concepts without codable children: Secondary | ICD-10-CM

## 2019-08-21 DIAGNOSIS — R232 Flushing: Secondary | ICD-10-CM

## 2019-08-21 LAB — POCT GLYCOSYLATED HEMOGLOBIN (HGB A1C): Hemoglobin A1C: 14.2 % — AB (ref 4.0–5.6)

## 2019-08-21 LAB — POCT URINALYSIS DIP (CLINITEK)
Bilirubin, UA: NEGATIVE
Blood, UA: NEGATIVE
Glucose, UA: 500 mg/dL — AB
Ketones, POC UA: NEGATIVE mg/dL
Leukocytes, UA: NEGATIVE
Nitrite, UA: NEGATIVE
POC PROTEIN,UA: NEGATIVE
Spec Grav, UA: 1.015 (ref 1.010–1.025)
Urobilinogen, UA: 0.2 E.U./dL
pH, UA: 5.5 (ref 5.0–8.0)

## 2019-08-21 LAB — GLUCOSE, POCT (MANUAL RESULT ENTRY)
POC Glucose: 383 mg/dl — AB (ref 70–99)
POC Glucose: 406 mg/dl — AB (ref 70–99)

## 2019-08-21 MED ORDER — SITAGLIPTIN PHOSPHATE 100 MG PO TABS: 100.0000 mg | ORAL_TABLET | Freq: Every day | ORAL | 3 refills | Status: DC

## 2019-08-21 MED ORDER — GLIMEPIRIDE 4 MG PO TABS: 8.0000 mg | ORAL_TABLET | Freq: Every day | ORAL | 2 refills | Status: DC

## 2019-08-21 MED ORDER — "INSULIN SYRINGE-NEEDLE U-100 31G X 5/16"" 0.5 ML MISC": 1.0000 | Freq: Every day | 5 refills | Status: DC

## 2019-08-21 MED ORDER — LANTUS SOLOSTAR 100 UNIT/ML ~~LOC~~ SOPN: 35.0000 [IU] | PEN_INJECTOR | Freq: Every day | SUBCUTANEOUS | 2 refills | Status: DC

## 2019-08-21 MED ORDER — FARXIGA 5 MG PO TABS: 5.0000 mg | ORAL_TABLET | Freq: Every day | ORAL | 5 refills | Status: DC

## 2019-08-21 MED ORDER — BD PEN NEEDLE ORIGINAL U/F 29G X 12.7MM MISC: 1.0000 | Freq: Three times a day (TID) | 12 refills | Status: DC

## 2019-08-21 MED ORDER — ATORVASTATIN CALCIUM 40 MG PO TABS: 40.0000 mg | ORAL_TABLET | Freq: Every day | ORAL | 2 refills | Status: DC

## 2019-08-21 MED ORDER — INSULIN ASPART 100 UNIT/ML ~~LOC~~ SOLN
20.0000 [IU] | Freq: Once | SUBCUTANEOUS | Status: AC
  Administered 2019-08-21: 20 [IU] via SUBCUTANEOUS

## 2019-08-21 MED ORDER — GABAPENTIN 100 MG PO CAPS: 200.0000 mg | ORAL_CAPSULE | Freq: Every day | ORAL | 3 refills | Status: DC

## 2019-08-21 MED FILL — !LANTUS SOLOSTAR 100UNITS/M: 100 | 25 days supply | Qty: 9 | Fill #0

## 2019-08-21 MED FILL — GABAPENTIN 100 MG CAP: 100 | 30 days supply | Qty: 60 | Fill #0

## 2019-08-21 MED FILL — FARXIGA 5 MG TABLET: 5 | 30 days supply | Qty: 30 | Fill #0

## 2019-08-21 MED FILL — TRUEPLUS PEN NDL 31G X 1/4: 31G X 6 MM | 30 days supply | Qty: 100 | Fill #0

## 2019-08-21 MED FILL — !JANUVIA 100MG TABLET: 100 | 30 days supply | Qty: 30 | Fill #0

## 2019-08-21 MED FILL — TRUEPLUS PEN NDL 31G X 1/4": 31G X 6 MM | 30 days supply | Qty: 100 | Fill #0

## 2019-08-21 MED FILL — ATORVASTATIN CALCIUM 40 MG: 40 | 30 days supply | Qty: 30 | Fill #0

## 2019-08-21 MED FILL — GLIMEPIRIDE 4 MG TABS: 4 | 30 days supply | Qty: 60 | Fill #0

## 2019-08-21 NOTE — Patient Instructions (Addendum)
Resume all meds. Check blood sugars fasting and at bedtime and bring to your next visit.

## 2019-08-21 NOTE — Progress Notes (Signed)
Patient ID: Bailey [name], female   DOB: 25-Apr-1956, 63 y.o.   MRN: [id number]   Bailey [name], is a 63 y.o. female  [id number]  [id number]  DOB - July 12, 1956  Subjective:  Chief Complaint and HPI: Bailey [name] is a 63 y.o. female here today bc she is out of all her meds and has been for several months.  She was about to start a new job and they wouldn't let her start bc her blood sugar was so high.  She says she has been managing her diabetes with diet for the last several months.  (obviously not controlled).    She used ear wax softening drops about 1 month ago. At times she has felt off balance since then.  No falls.  Occurs only occasionally.  She has not checked her sugar when she has felt this way.    ROS:   Constitutional:  No f/c, No night sweats, No unexplained weight loss. EENT:  No vision changes, No blurry vision, No hearing changes. No mouth, throat, or ear problems.  Respiratory: No cough, No SOB Cardiac: No CP, no palpitations GI:  No abd pain, No N/V/D. GU: No Urinary s/sx Musculoskeletal: No joint pain Neuro: No headache, no dizziness, no motor weakness.  Skin: No rash Endocrine:  No polydipsia. No polyuria.  Psych: Denies SI/HI  No problems updated.  ALLERGIES: Allergies  Allergen Reactions  . Metformin And Related Diarrhea  . Shellfish Allergy Anaphylaxis, Hives and Swelling    Lobster, crab (shrimp's ok)  . Ivp Dye [Iodinated Diagnostic Agents] Hives  . Adhesive [Tape] Other (See Comments)    blisters  . Chantix [Varenicline] Other (See Comments)    depression  . Codeine Other (See Comments)    Unknown childhood allergy  . Hydrocodone-Acetaminophen   . Latex Other (See Comments)    blisters  . Penicillins Other (See Comments)    Unknown childhood reaction    PAST MEDICAL HISTORY: Past Medical History:  Diagnosis Date  . Arthritis    RIGHT shoulder, knees  . Cervical radiculopathy at C5   . Dupuytren's contracture of  both hands    L > R  . Dyslipidemia   . History of adenomatous polyp of colon    1997 and 2003    . History of chronic pelvic inflammatory disease   . History of gastric ulcer    2008  . Hypertriglyceridemia   . Right carpal tunnel syndrome   . Type 2 diabetes mellitus, uncontrolled ([place])    last A1c 8.9 on 07-07-2016  . Vitamin D deficiency     MEDICATIONS AT HOME: Prior to Admission medications   Medication Sig Start Date End Date Taking? Authorizing Provider  acetaminophen (TYLENOL) 500 MG tablet Take 1 tablet (500 mg total) by mouth every 6 (six) hours as needed for mild pain or moderate pain. 07/27/17  Yes Waynetta Pean, PA-C  Blood Glucose Monitoring Suppl (TRUE METRIX METER) DEVI 1 each by Does not apply route 3 (three) times daily. 04/19/17  Yes Funches, Josalyn, MD  Cholecalciferol (VITAMIN D3) 2000 units TABS Take 2,000 Units by mouth daily. 10/21/16  Yes Funches, Josalyn, MD  glucose blood (TRUE METRIX BLOOD GLUCOSE TEST) test strip 1 each by Other route 3 (three) times daily. 04/19/17  Yes Funches, Josalyn, MD  glucose blood test strip Use as instructed 09/28/18  Yes Ladell Pier, MD  hydrocortisone valerate cream (WESTCORT) 0.2 % Apply 1 application topically 2 (two) times daily. 06/06/17  Yes Funches, Josalyn,  MD  Insulin Glargine (LANTUS SOLOSTAR) 100 UNIT/ML Solostar Pen Inject 35 Units into the skin at bedtime. 08/21/19  Yes Robie Oats M, PA-C  Insulin Pen Needle (BD ULTRA-FINE PEN NEEDLES) 29G X 12.7MM MISC 1 each by Does not apply route 3 (three) times daily. 08/21/19  Yes Doyel Mulkern M, PA-C  Insulin Syringe-Needle U-100 (B-D INS SYRINGE 0.5CC/31GX5/16) 31G X 5/16" 0.5 ML MISC 1 each by Does not apply route at bedtime. 08/21/19  Yes Argentina Donovan, PA-C  Lancet Devices (ACCU-CHEK [place]) lancets Use as instructed 06/15/16  Yes Funches, Josalyn, MD  meloxicam (MOBIC) 15 MG tablet TAKE 1 TABLET BY MOUTH DAILY. 03/20/18  Yes Gildardo Pounds, NP  Multiple  Vitamins-Minerals (MULTIVITAMIN WITH MINERALS) tablet Take 1 tablet by mouth every other day.    Yes [provider]  sitaGLIPtin (JANUVIA) 100 MG tablet Take 1 tablet (100 mg total) by mouth daily. 08/21/19  Yes Raylynne Cubbage, Dionne Bucy, PA-C  TRUEPLUS LANCETS 28G MISC 1 each by Does not apply route 3 (three) times daily. 04/19/17  Yes Boykin Nearing, MD  VIVELLE-DOT 0.025 MG/24HR PLACE 1 PATCH ONTO THE SKIN 2 TIMES A WEEK. 04/06/17  Yes Woodroe Mode, MD  atorvastatin (LIPITOR) 40 MG tablet Take 1 tablet (40 mg total) by mouth daily. 08/21/19   Argentina Donovan, PA-C  dapagliflozin propanediol (FARXIGA) 5 MG TABS tablet Take 5 mg by mouth daily. 08/21/19   Argentina Donovan, PA-C  gabapentin (NEURONTIN) 100 MG capsule Take 2 capsules (200 mg total) by mouth at bedtime. 08/21/19   Argentina Donovan, PA-C  glimepiride (AMARYL) 4 MG tablet Take 2 tablets (8 mg total) by mouth daily before breakfast. 08/21/19   Argentina Donovan, PA-C  methocarbamol (ROBAXIN) 500 MG tablet Take 1 tablet (500 mg total) by mouth every 8 (eight) hours as needed for muscle spasms. Patient not taking: Reported on 06/08/2018 09/19/17   Argentina Donovan, PA-C     Objective:  EXAM:   Vitals:   08/21/19 1440  BP: 125/73  Pulse: 80  Resp: 18  Temp: 98.5 F (36.9 C)  TempSrc: Oral  SpO2: 99%  Weight: 129 lb (58.5 kg)  Height: 5\' 2"  (1.575 m)    General appearance : A&OX3. NAD. Non-toxic-appearing HEENT: Atraumatic and Normocephalic.  PERRLA. EOM intact.  TM clear B. Neck: supple, no JVD. No cervical lymphadenopathy. No thyromegaly Chest/Lungs:  Breathing-non-labored, Good air entry bilaterally, breath sounds normal without rales, rhonchi, or wheezing  CVS: S1 S2 regular, no murmurs, gallops, rubs  Extremities: Bilateral Lower Ext shows no edema, both legs are warm to touch with = pulse throughout Neurology:  CN II-XII grossly intact, Non focal.  Normal gait and heel to toe walking.  Normal finger to nose and  heel to shin.  Negative rhomberg.   Psych:  TP linear. J/I WNL. Normal speech. Appropriate eye contact and affect.  Skin:  No Rash  Data Review Lab Results  Component Value Date   HGBA1C 9.6 (H) 07/06/2018   HGBA1C 8.0 04/09/2018   HGBA1C 9.2 01/04/2018     Assessment & Plan   1. Uncontrolled type 2 diabetes mellitus without complication, with long-term current use of insulin (HCC) Uncontrolled- not taking meds X several months.  Check blood sugars bid and record and bring to next visit.  Check labs today.  Decrease/eliminate sugar.  COMPLIANCE stressed at length.   - HgB A1c - Glucose (CBG) - insulin aspart (novoLOG) injection 20 Units  2. Diabetes mellitus  type 2, uncontrolled, without complications (HCC) Resume meds - dapagliflozin propanediol (FARXIGA) 5 MG TABS tablet; Take 5 mg by mouth daily.  Dispense: 30 tablet; Refill: 5 - glimepiride (AMARYL) 4 MG tablet; Take 2 tablets (8 mg total) by mouth daily before breakfast.  Dispense: 60 tablet; Refill: 2 - Insulin Syringe-Needle U-100 (B-D INS SYRINGE 0.5CC/31GX5/16) 31G X 5/16" 0.5 ML MISC; 1 each by Does not apply route at bedtime.  Dispense: 30 each; Refill: 5 - Insulin Pen Needle (BD ULTRA-FINE PEN NEEDLES) 29G X 12.7MM MISC; 1 each by Does not apply route 3 (three) times daily.  Dispense: 100 each; Refill: 12 - sitaGLIPtin (JANUVIA) 100 MG tablet; Take 1 tablet (100 mg total) by mouth daily.  Dispense: 90 tablet; Refill: 3 - Insulin Glargine (LANTUS SOLOSTAR) 100 UNIT/ML Solostar Pen; Inject 35 Units into the skin at bedtime.  Dispense: 15 mL; Refill: 2  3. Hot flashes - gabapentin (NEURONTIN) 100 MG capsule; Take 2 capsules (200 mg total) by mouth at bedtime.  Dispense: 60 capsule; Refill: 3  4. Dyslipidemia with low high density lipoprotein (HDL) cholesterol with hypertriglyceridemia due to type 2 diabetes mellitus (HCC) - atorvastatin (LIPITOR) 40 MG tablet; Take 1 tablet (40 mg total) by mouth daily.  Dispense: 30  tablet; Refill: 2  5. Unsteady Ambulates normally and with steady GAIT HERE TODAY. No red flags on exam-- Possibly due to poor blood sugar control - Ambulatory referral to Neurology   Patient have been counseled extensively about nutrition and exercise  Return for 9/22 appt with Dr Margarita Rana.  The patient was given clear instructions to go to ER or return to medical center if symptoms don't improve, worsen or new problems develop. The patient verbalized understanding. The patient was told to call to get lab results if they haven't heard anything in the next week.     Freeman Caldron, PA-C [facility] [address] [facility] [place], [place]   08/21/2019, 2:48 PM

## 2019-08-22 LAB — LIPID PANEL
Chol/HDL Ratio: 6.4 ratio — ABNORMAL HIGH (ref 0.0–4.4)
Cholesterol, Total: 218 mg/dL — ABNORMAL HIGH (ref 100–199)
HDL: 34 mg/dL — ABNORMAL LOW (ref >39)
Triglycerides: 548 mg/dL — ABNORMAL HIGH (ref 0–149)

## 2019-08-22 LAB — COMPREHENSIVE METABOLIC PANEL
ALT: 20 IU/L (ref 0–32)
AST: 18 IU/L (ref 0–40)
Albumin/Globulin Ratio: 1.7 (ref 1.2–2.2)
Albumin: 4.3 g/dL (ref 3.8–4.8)
Alkaline Phosphatase: 103 IU/L (ref 39–117)
BUN/Creatinine Ratio: 17 (ref 12–28)
BUN: 11 mg/dL (ref 8–27)
Bilirubin Total: 0.2 mg/dL (ref 0.0–1.2)
CO2: 21 mmol/L (ref 20–29)
Calcium: 9.2 mg/dL (ref 8.7–10.3)
Chloride: 100 mmol/L (ref 96–106)
Creatinine, Ser: 0.65 mg/dL (ref 0.57–1.00)
GFR calc Af Amer: 108 mL/min/{1.73_m2} (ref >59)
GFR calc non Af Amer: 93 mL/min/{1.73_m2} (ref >59)
Globulin, Total: 2.5 g/dL (ref 1.5–4.5)
Glucose: 365 mg/dL — ABNORMAL HIGH (ref 65–99)
Potassium: 4.3 mmol/L (ref 3.5–5.2)
Sodium: 138 mmol/L (ref 134–144)
Total Protein: 6.8 g/dL (ref 6.0–8.5)

## 2019-08-22 LAB — VITAMIN D 25 HYDROXY (VIT D DEFICIENCY, FRACTURES)

## 2019-08-22 LAB — TSH: TSH: 1.51 u[IU]/mL (ref 0.450–4.500)

## 2019-08-23 ENCOUNTER — Other Ambulatory Visit: Payer: Self-pay | Admitting: Physician Assistant

## 2019-08-26 ENCOUNTER — Telehealth: Payer: Self-pay | Admitting: Internal Medicine

## 2019-08-26 ENCOUNTER — Ambulatory Visit: Payer: Self-pay | Admitting: Family Medicine

## 2019-08-26 NOTE — Telephone Encounter (Signed)
Forwarded to PCP.

## 2019-08-26 NOTE — Telephone Encounter (Signed)
Patient called stating she was called in regards to her sugars being in the 400's patient was advised to go to the ED or UC. Please follow up.

## 2019-08-27 NOTE — Telephone Encounter (Signed)
Pt came into the office provide provider message and lab results. Pt doesn't have any questions or concerns

## 2019-09-06 ENCOUNTER — Telehealth: Payer: Self-pay | Admitting: Internal Medicine

## 2019-09-06 NOTE — Telephone Encounter (Signed)
Will forward to pcp

## 2019-09-06 NOTE — Telephone Encounter (Signed)
New Message   Pt came in wanting to know if she can get a release letter stating she can work, her job is requiring it. Pt last seen 08/21/2019 and pt dropped off a copy of her sugar log and it was placed in Dr. Durenda Age Box

## 2019-09-09 NOTE — Telephone Encounter (Signed)
Will give Bailey [name] blood sugar readings on Wednesday

## 2019-09-12 MED FILL — LANTUS SOLOSTAR 100 UNITS/M: 100 | 25 days supply | Qty: 9 | Fill #1

## 2019-09-12 NOTE — Telephone Encounter (Signed)
Spoke with patient and she;s stating she is needing a letter to return to work saying that your blood sugar went down and is at a controlled leave and it's been 2 wks now.

## 2019-09-12 NOTE — Telephone Encounter (Signed)
Please call patient and let her know her blood sugars are improving based on the papers she sent but still not controlled/at goal.  She needs to eliminate sugar/white carbs from her diet and keep her appt for 09/17/2019 in our office.  Thanks, Freeman Caldron, PA-C

## 2019-09-12 NOTE — Telephone Encounter (Signed)
Pt aware of message per A.McClung, PA-C.

## 2019-09-12 NOTE — Telephone Encounter (Signed)
Please copy and pate this into a note for her dated 09/12/2019 date:    "Ms Bailey [name] was seen in our office 08/21/2019 and restarted her medications for diabetes.  As of 09/11/2019, a blood sugar log she provided our office shows that her blood sugars are much better.  She has another appointment in our office 09/17/2019.  As long as he takes her medications and avoids sugar, her blood sugars are in a safe range to work as of 09/13/2019."  Thanks, Freeman Caldron, PA-C

## 2019-09-12 NOTE — Telephone Encounter (Signed)
Will forward to Angela

## 2019-09-12 NOTE — Telephone Encounter (Signed)
Follow up   Pt came in to check on her sugar log that was dropped off on 9/11. Please f/u

## 2019-09-13 ENCOUNTER — Other Ambulatory Visit: Payer: Self-pay | Admitting: Pharmacist

## 2019-09-13 MED ORDER — ONETOUCH VERIO VI STRP: ORAL_STRIP | 11 refills | Status: DC

## 2019-09-13 MED ORDER — ONETOUCH VERIO W/DEVICE KIT: PACK | 0 refills | Status: DC

## 2019-09-13 MED ORDER — ONETOUCH DELICA LANCETS 33G MISC: 11 refills | Status: DC

## 2019-09-13 MED FILL — ONETOUCH VERIO METER SYSTEM: W/DEVICE | 30 days supply | Qty: 1 | Fill #0

## 2019-09-13 MED FILL — ONETOUCH DELICA PLUS LANCET: 25 days supply | Qty: 100 | Fill #0

## 2019-09-13 MED FILL — ONE TOUCH VERIO TEST STRIP: 25 days supply | Qty: 100 | Fill #0

## 2019-09-13 NOTE — Telephone Encounter (Signed)
Called patient and [facility] informing her that the letter is at front desk and can pick up at her convince

## 2019-09-17 ENCOUNTER — Other Ambulatory Visit: Payer: Self-pay

## 2019-09-17 ENCOUNTER — Ambulatory Visit: Payer: Medicare Other | Attending: Family Medicine | Admitting: Family Medicine

## 2019-09-17 ENCOUNTER — Telehealth: Payer: Self-pay

## 2019-09-17 ENCOUNTER — Encounter: Payer: Self-pay | Admitting: Family Medicine

## 2019-09-17 VITALS — BP 108/66 | HR 73 | Temp 98.2°F | Ht 62.0 in | Wt 137.6 lb

## 2019-09-17 DIAGNOSIS — E782 Mixed hyperlipidemia: Secondary | ICD-10-CM

## 2019-09-17 DIAGNOSIS — Z23 Encounter for immunization: Secondary | ICD-10-CM | POA: Diagnosis not present

## 2019-09-17 DIAGNOSIS — E11649 Type 2 diabetes mellitus with hypoglycemia without coma: Secondary | ICD-10-CM

## 2019-09-17 DIAGNOSIS — E1169 Type 2 diabetes mellitus with other specified complication: Secondary | ICD-10-CM

## 2019-09-17 DIAGNOSIS — R232 Flushing: Secondary | ICD-10-CM

## 2019-09-17 DIAGNOSIS — K5909 Other constipation: Secondary | ICD-10-CM

## 2019-09-17 LAB — GLUCOSE, POCT (MANUAL RESULT ENTRY): POC Glucose: 97 mg/dl (ref 70–99)

## 2019-09-17 MED ORDER — ATORVASTATIN CALCIUM 40 MG PO TABS: 40.0000 mg | ORAL_TABLET | Freq: Every day | ORAL | 6 refills | Status: DC

## 2019-09-17 MED ORDER — GABAPENTIN 100 MG PO CAPS: 200.0000 mg | ORAL_CAPSULE | Freq: Every day | ORAL | 6 refills | Status: DC

## 2019-09-17 MED ORDER — LANTUS SOLOSTAR 100 UNIT/ML ~~LOC~~ SOPN: 33.0000 [IU] | PEN_INJECTOR | Freq: Every day | SUBCUTANEOUS | 6 refills | Status: DC

## 2019-09-17 MED ORDER — JARDIANCE 10 MG PO TABS: 10.0000 mg | ORAL_TABLET | Freq: Every day | ORAL | 6 refills | Status: DC

## 2019-09-17 MED ORDER — GLIMEPIRIDE 4 MG PO TABS: 8.0000 mg | ORAL_TABLET | Freq: Every day | ORAL | 6 refills | Status: DC

## 2019-09-17 NOTE — Progress Notes (Signed)
Subjective:  Patient ID: Bailey [name], female    DOB: [birth year]  Age: 63 y.o. MRN: [id number]  CC: Diabetes   HPI Bailey [name] is a 63 year old female with a history of type 2 diabetes mellitus (A1c 14.2), dyslipidemia here for follow-up visit. She brings in her blood sugar logs which reveals fasting sugars between 59 and 121 and random sugars in the 170 range.  She has had hypoglycemic episodes with blood sugars of 57-67.  She denies visual concerns or numbness in extremities. Currently compliant with Lantus, Farxiga, glimepiride.  Note received from pharmacy staff indicates [name] needs prior authorization. She endorses compliance with Lipitor for hyperlipidemia. Complains of constipation and was previously able to use the bathroom when she drank orange juice but this is currently ineffective.  Past Medical History:  Diagnosis Date  . Arthritis    RIGHT shoulder, knees  . Cervical radiculopathy at C5   . Dupuytren's contracture of both hands    L > R  . Dyslipidemia   . History of adenomatous polyp of colon    1997 and 2003    . History of chronic pelvic inflammatory disease   . History of gastric ulcer    2008  . Hypertriglyceridemia   . Right carpal tunnel syndrome   . Type 2 diabetes mellitus, uncontrolled ([place])    last A1c 8.9 on 07-07-2016  . Vitamin D deficiency     Past Surgical History:  Procedure Laterality Date  . COLONOSCOPY  last one 07-15-2016  . ESOPHAGOGASTRODUODENOSCOPY  last one 2008  . FASCIECTOMY Left 08/24/2016   Procedure: LEFT HAND PALMER AND SMALL FINGER DIGiTAL FASCIECTOMY AND RELEASE;  Surgeon: Iran Planas, MD;  Location: [facility];  Service: Orthopedics;  Laterality: Left;  . LAPAROSCOPIC CHOLECYSTECTOMY  07/17/2000  . ROTATOR CUFF REPAIR Right 2000  . TOTAL ABDOMINAL HYSTERECTOMY W/ BILATERAL SALPINGOOPHORECTOMY  1998    Family History  Problem Relation Age of Onset  . Diabetes Mother   .  Hypertension Mother   . Dementia Mother   . Pancreatitis Mother   . Asthma Mother   . COPD Mother   . Stroke Mother   . Hyperlipidemia Mother   . Heart disease Mother   . Hypertension Father   . Hypertension Sister   . Diabetes Sister   . Hyperlipidemia Sister   . Hypertension Brother   . Diabetes Brother   . Drug abuse Brother   . Cancer Brother        colon  . Hyperlipidemia Brother   . Colon cancer Brother 74  . Hyperlipidemia Brother   . Hypertension Brother   . Hyperlipidemia Sister   . Hypertension Sister   . Diabetes Sister   . Diabetes Maternal Grandmother   . Colon cancer Maternal Grandmother   . Breast cancer Neg Hx   . Esophageal cancer Neg Hx   . Stomach cancer Neg Hx     Allergies  Allergen Reactions  . Metformin And Related Diarrhea  . Shellfish Allergy Anaphylaxis, Hives and Swelling    Lobster, crab (shrimp's ok)  . Ivp Dye [Iodinated Diagnostic Agents] Hives  . Adhesive [Tape] Other (See Comments)    blisters  . Chantix [Varenicline] Other (See Comments)    depression  . Codeine Other (See Comments)    Unknown childhood allergy  . Hydrocodone-Acetaminophen   . Latex Other (See Comments)    blisters  . Penicillins Other (See Comments)    Unknown childhood reaction  Outpatient Medications Prior to Visit  Medication Sig Dispense Refill  . acetaminophen (TYLENOL) 500 MG tablet Take 1 tablet (500 mg total) by mouth every 6 (six) hours as needed for mild pain or moderate pain. 30 tablet 0  . Blood Glucose Monitoring Suppl (ONETOUCH VERIO) w/Device KIT Use to check blood sugar daily. 1 kit 0  . Cholecalciferol (VITAMIN D3) 2000 units TABS Take 2,000 Units by mouth daily. 30 tablet 11  . glucose blood (ONETOUCH VERIO) test strip Use as instructed to check blood sugar daily. 100 each 11  . hydrocortisone valerate cream (WESTCORT) 0.2 % Apply 1 application topically 2 (two) times daily. 45 g 0  . Insulin Pen Needle (BD ULTRA-FINE PEN NEEDLES) 29G X  12.7MM MISC 1 each by Does not apply route 3 (three) times daily. 100 each 12  . Insulin Syringe-Needle U-100 (B-D INS SYRINGE 0.5CC/31GX5/16) 31G X 5/16" 0.5 ML MISC 1 each by Does not apply route at bedtime. 30 each 5  . Lancet Devices (ACCU-CHEK SOFTCLIX) lancets Use as instructed 1 each 0  . meloxicam (MOBIC) 15 MG tablet TAKE 1 TABLET BY MOUTH DAILY. 30 tablet 0  . Multiple Vitamins-Minerals (MULTIVITAMIN WITH MINERALS) tablet Take 1 tablet by mouth every other day.     [name] Delica Lancets 46E MISC Use to check blood sugar daily. 100 each 11  . sitaGLIPtin (JANUVIA) 100 MG tablet Take 1 tablet (100 mg total) by mouth daily. 90 tablet 3  . atorvastatin (LIPITOR) 40 MG tablet Take 1 tablet (40 mg total) by mouth daily. 30 tablet 2  . dapagliflozin propanediol (FARXIGA) 5 MG TABS tablet Take 5 mg by mouth daily. 30 tablet 5  . gabapentin (NEURONTIN) 100 MG capsule Take 2 capsules (200 mg total) by mouth at bedtime. 60 capsule 3  . glimepiride (AMARYL) 4 MG tablet Take 2 tablets (8 mg total) by mouth daily before breakfast. 60 tablet 2  . Insulin Glargine (LANTUS SOLOSTAR) 100 UNIT/ML Solostar Pen Inject 35 Units into the skin at bedtime. 15 mL 2  . methocarbamol (ROBAXIN) 500 MG tablet Take 1 tablet (500 mg total) by mouth every 8 (eight) hours as needed for muscle spasms. (Patient not taking: Reported on 06/08/2018) 30 tablet 0  . VIVELLE-DOT 0.025 MG/24HR PLACE 1 PATCH ONTO THE SKIN 2 TIMES A WEEK. (Patient not taking: Reported on 09/17/2019) 8 patch 12   Facility-Administered Medications Prior to Visit  Medication Dose Route Frequency Provider Last Rate Last Dose  . 0.9 %  sodium chloride infusion  500 mL Intravenous Once Nandigam, Kavitha V, MD         ROS Review of Systems  Constitutional: Negative for activity change, appetite change and fatigue.  HENT: Negative for congestion, sinus pressure and sore throat.   Eyes: Negative for visual disturbance.  Respiratory: Negative for  cough, chest tightness, shortness of breath and wheezing.   Cardiovascular: Negative for chest pain and palpitations.  Gastrointestinal: Positive for constipation. Negative for abdominal distention and abdominal pain.  Endocrine: Negative for polydipsia.  Genitourinary: Negative for dysuria and frequency.  Musculoskeletal: Negative for arthralgias and back pain.  Skin: Negative for rash.  Neurological: Negative for tremors, light-headedness and numbness.  Hematological: Does not bruise/bleed easily.  Psychiatric/Behavioral: Negative for agitation and behavioral problems.    Objective:  BP 108/66   Pulse 73   Temp 98.2 F (36.8 C) (Oral)   Ht 5' 2" (1.575 m)   Wt 137 lb 9.6 oz (62.4 kg)   SpO2  99%   BMI 25.17 kg/m   BP/Weight 09/17/2019 08/21/2019 04/03/8118  Systolic BP 147 829 562  Diastolic BP 66 73 79  Wt. (Lbs) 137.6 129 132  BMI 25.17 23.59 24.94      Physical Exam Constitutional:      Appearance: She is well-developed.  Neck:     Vascular: No JVD.  Cardiovascular:     Rate and Rhythm: Normal rate.     Heart sounds: Normal heart sounds. No murmur.  Pulmonary:     Effort: Pulmonary effort is normal.     Breath sounds: Normal breath sounds. No wheezing or rales.  Chest:     Chest wall: No tenderness.  Abdominal:     General: Bowel sounds are normal. There is no distension.     Palpations: Abdomen is soft. There is no mass.     Tenderness: There is no abdominal tenderness.  Musculoskeletal: Normal range of motion.     Right lower leg: No edema.     Left lower leg: No edema.  Neurological:     Mental Status: She is alert and oriented to person, place, and time.  Psychiatric:        Mood and Affect: Mood normal.     CMP Latest Ref Rng & Units 08/21/2019 07/06/2018 07/04/2017  Glucose 65 - 99 mg/dL 365(H) 143(H) 202(H)  BUN 8 - 27 mg/dL _0 Creatinine 0.57 - 1.00 mg/dL 0.65 0.79 0.79  Sodium 134 - 144 mmol/L 138 145(H) 141  Potassium 3.5 - 5.2 mmol/L 4.3  3.8 4.2  Chloride 96 - 106 mmol/L 100 106 103  CO2 20 - 29 mmol/L _1 Calcium 8.7 - 10.3 mg/dL 9.2 9.3 9.3  Total Protein 6.0 - 8.5 g/dL 6.8 6.9 7.1  Total Bilirubin 0.0 - 1.2 mg/dL 0.2 0.3 0.4  Alkaline Phos 39 - 117 IU/L 103 86 83  AST 0 - 40 IU/L _2 ALT 0 - 32 IU/L 20 41(H) 32    Lipid Panel     Component Value Date/Time   CHOL 218 (H) 08/21/2019 1510   TRIG 548 (H) 08/21/2019 1510   HDL 34 (L) 08/21/2019 1510   CHOLHDL 6.4 (H) 08/21/2019 1510   CHOLHDL 7.0 (H) 11/12/2015 1153   VLDL NOT CALC 11/12/2015 1153   LDLCALC Comment 08/21/2019 1510   LDLDIRECT 45 06/10/2016 1213    CBC    Component Value Date/Time   WBC 6.6 07/06/2018 0930   WBC 7.6 08/22/2015 1009   RBC 4.45 07/06/2018 0930   RBC 4.66 08/22/2015 1009   HGB 14.1 07/06/2018 0930   HCT 40.3 07/06/2018 0930   PLT 117 (L) 07/06/2018 0930   MCV 91 07/06/2018 0930   MCH 31.7 07/06/2018 0930   MCH 31.1 08/22/2015 1009   MCHC 35.0 07/06/2018 0930   MCHC 33.6 08/22/2015 1009   RDW 14.2 07/06/2018 0930   LYMPHSABS 2.4 08/22/2015 1009   MONOABS 0.7 08/22/2015 1009   EOSABS 0.1 08/22/2015 1009   BASOSABS 0.0 08/22/2015 1009    Lab Results  Component Value Date   HGBA1C 14.2 (A) 08/21/2019    Assessment & Plan:   1. Uncontrolled type 2 diabetes mellitus with hypoglycemia without coma ([place]) Uncontrolled with A1c of 14.2 but recent blood sugar log reveals some hypoglycemia of 57-60 Reduce Lantus from 35 units to 33 units she has been educated on management of hypoglycemia and knows to contact the clinic if this occurs; also advised to  down titrate by 2 units if hypoglycemia occurs. Substituted for [employer] with Jardiance as the former is not covered by insurance  HOW TO TREAT LOW BLOOD SUGARS (Blood sugar LESS THAN 70 MG/DL)  Please follow the RULE OF 15 for the treatment of hypoglycemia treatment (when your (blood sugars are less than 70 mg/dL)     STEP 1: Take 15 grams of carbohydrates when your  blood sugar is low, which includes:   3-4 GLUCOSE TABS              OR  3-4 OZ OF JUICE OR REGULAR SODA       OR  ONE TUBE OF GLUCOSE GEL                        STEP 2: RECHECK blood sugar in 15 MINUTES STEP 3: If your blood sugar is still low at the 15 minute recheck --> then, go back to STEP 1 and treat AGAIN with another 15 grams of carbohydrates.    - POCT glucose (manual entry) - empagliflozin (JARDIANCE) 10 MG TABS tablet; Take 10 mg by mouth daily before breakfast.  Dispense: 30 tablet; Refill: 6 - glimepiride (AMARYL) 4 MG tablet; Take 2 tablets (8 mg total) by mouth daily before breakfast.  Dispense: 60 tablet; Refill: 6 - Insulin Glargine (LANTUS SOLOSTAR) 100 UNIT/ML Solostar Pen; Inject 33 Units into the skin at bedtime.  Dispense: 15 mL; Refill: 6 - Microalbumin/Creatinine Ratio, Urine  2. Dyslipidemia with low high density lipoprotein (HDL) cholesterol with hypertriglyceridemia due to type 2 diabetes mellitus ([place]) Uncontrolled due to previous noncompliance She is now more compliant Low-cholesterol diet - atorvastatin (LIPITOR) 40 MG tablet; Take 1 tablet (40 mg total) by mouth daily.  Dispense: 30 tablet; Refill: 6  3. Hot flashes Stable - gabapentin (NEURONTIN) 100 MG capsule; Take 2 capsules (200 mg total) by mouth at bedtime.  Dispense: 60 capsule; Refill: 6  4. Need for influenza vaccination - Flu Vaccine QUAD 36+ mos IM  5.  Constipation Discussed increasing fiber intake, water intake, use of prune juice  Meds ordered this encounter  Medications  . empagliflozin (JARDIANCE) 10 MG TABS tablet    Sig: Take 10 mg by mouth daily before breakfast.    Dispense:  30 tablet    Refill:  6    Discontinue Farxiga  . atorvastatin (LIPITOR) 40 MG tablet    Sig: Take 1 tablet (40 mg total) by mouth daily.    Dispense:  30 tablet    Refill:  6  . gabapentin (NEURONTIN) 100 MG capsule    Sig: Take 2 capsules (200 mg total) by mouth at bedtime.    Dispense:  60  capsule    Refill:  6  . glimepiride (AMARYL) 4 MG tablet    Sig: Take 2 tablets (8 mg total) by mouth daily before breakfast.    Dispense:  60 tablet    Refill:  6  . Insulin Glargine (LANTUS SOLOSTAR) 100 UNIT/ML Solostar Pen    Sig: Inject 33 Units into the skin at bedtime.    Dispense:  15 mL    Refill:  6    Follow-up: Return in about 3 months (around 12/17/2019) for Dr Wynetta Emery (PCP) - medical conditions.       Charlott Rakes, MD, FAAFP. [facility] and [facility], [place]   09/17/2019, 11:58 AM

## 2019-09-17 NOTE — Telephone Encounter (Signed)
Patient's Bailey [name] is requiring a prior auth, but invokana or jardiance is the preferred medication on pt's ins plan.  If appropriate, will you consider changing therapy and sending script to pt's preferred pharmacy?

## 2019-09-17 NOTE — Telephone Encounter (Signed)
Please check with Dr Theodosia Blender saw the patient today.  Thanks, Freeman Caldron, PA-C

## 2019-09-18 LAB — MICROALBUMIN / CREATININE URINE RATIO
Creatinine, Urine: 107.2 mg/dL
Microalb/Creat Ratio: 4 mg/g creat (ref 0–29)
Microalbumin, Urine: 4 ug/mL

## 2019-09-20 ENCOUNTER — Telehealth: Payer: Self-pay

## 2019-09-20 NOTE — Telephone Encounter (Signed)
Patient name and DOB has been verified Patient was informed of lab results. Patient had no questions.  

## 2019-09-20 NOTE — Telephone Encounter (Signed)
-----   Message from Charlott Rakes, MD sent at 09/18/2019  6:32 PM EDT ----- Please inform the patient that labs are normal. Thank you.

## 2019-09-26 MED FILL — GABAPENTIN 100 MG CAP: 100 | 30 days supply | Qty: 60 | Fill #1

## 2019-09-26 MED FILL — JANUVIA 100 MG TABLET: 100 | 30 days supply | Qty: 30 | Fill #1

## 2019-09-26 MED FILL — GLIMEPIRIDE 4 MG TABS: 4 | 30 days supply | Qty: 60 | Fill #1

## 2019-09-26 MED FILL — ATORVASTATIN CALCIUM 40 MG: 40 | 30 days supply | Qty: 30 | Fill #1

## 2019-10-03 ENCOUNTER — Ambulatory Visit (INDEPENDENT_AMBULATORY_CARE_PROVIDER_SITE_OTHER): Payer: Medicare Other | Admitting: Neurology

## 2019-10-03 ENCOUNTER — Encounter: Payer: Self-pay | Admitting: Neurology

## 2019-10-03 ENCOUNTER — Other Ambulatory Visit: Payer: Self-pay

## 2019-10-03 ENCOUNTER — Other Ambulatory Visit: Payer: Self-pay | Admitting: Internal Medicine

## 2019-10-03 VITALS — BP 107/60 | HR 72 | Ht 62.0 in | Wt 139.0 lb

## 2019-10-03 DIAGNOSIS — Z1231 Encounter for screening mammogram for malignant neoplasm of breast: Secondary | ICD-10-CM

## 2019-10-03 DIAGNOSIS — R2681 Unsteadiness on feet: Secondary | ICD-10-CM

## 2019-10-03 NOTE — Patient Instructions (Addendum)
Your neurological examination does not show any obvious abnormality.  I do not believe that you have a primary neurological cause of your unsteadiness and for the right leg giving out.  Since you do have right knee pain from time to time it could be related to arthritis.  As discussed, we can certainly consider an EMG and nerve conduction velocity test, which is an electrical nerve and muscle test in the Future if needed but for now, you are working on improving your diabetes control which is going to be important to prevent nerve damage from diabetes which recall neuropathy. If you have more problems with right knee pain, you may benefit from seeing an orthopedic doctor.  For now, I would be happy to see you back on an as-needed basis.

## 2019-10-03 NOTE — Progress Notes (Signed)
Subjective:    Patient ID: Bailey [name] is a 63 y.o. female.  HPI     Star Age, MD, PhD [facility] [address] [place], [place] [address]  Dear Levada Dy,  I saw your patient, Bailey [name], upon your kind request to my neurologic clinic today for initial consultation of her gait disorder.  The patient is unaccompanied today.  As you know, Bailey [name] is a 63 year old right-handed woman with an underlying medical history of vitamin D deficiency, uncontrolled diabetes, history of carpal tunnel syndrome on the right, hypertriglyceridemia, hyperlipidemia, Dupuytren's contractures with status post surgery on the left side, cervical radiculopathy, arthritis, history of gastric ulcer, and borderline overweight state, who reports intermittent issues with unsteadiness while walking.  This has been going on for the past year at least.  She has not actually fallen recently.  She feels like her right leg gives out sometimes.  She has no sustained numbness or tingling or weakness.  She has intermittent right knee pain.  She has not seen orthopedics for her right knee.  She sees a hand specialist for her Dupuytren's contractures bilaterally and is considering right hand surgery.  She does not typically use a cane or walker.  I reviewed your office note from 08/21/2019. She had blood work through your office on 08/21/2019.  Her blood sugar was 365.  Her blood specimen was lipemic and therefore a vitamin D level was not possible.  Total cholesterol was 218, triglycerides highly elevated at 548, LDL was not possible secondary to hypertriglyceridemia.  Her A1c was 14.2.  She reports that her blood sugar numbers are improving.  She has a follow-up appointment pending.  Next week she is scheduled to see Dr. Katy Fitch for her eye examination.  She denies any numbness in her feet or burning sensation in her fingers, hands, or feet.  She is taking gabapentin.  She is  not sure when this was started and why she is taking it.  She has had some changes in her diabetes medications.  She has been trying to hydrate well with water.  She does not drink alcohol.  She smokes about 8 cigarettes/day and is working on smoking cessation. She denies any sudden onset of one-sided weakness or numbness or tingling or droopy face or slurring of speech.  She has no radiating back pain.  Her Past Medical History Is Significant For: Past Medical History:  Diagnosis Date  . Arthritis    RIGHT shoulder, knees  . Cervical radiculopathy at C5   . Dupuytren's contracture of both hands    L > R  . Dyslipidemia   . History of adenomatous polyp of colon    1997 and 2003    . History of chronic pelvic inflammatory disease   . History of gastric ulcer    2008  . Hypertriglyceridemia   . Right carpal tunnel syndrome   . Type 2 diabetes mellitus, uncontrolled ([place])    last A1c 8.9 on 07-07-2016  . Vitamin D deficiency     Her Past Surgical History Is Significant For: Past Surgical History:  Procedure Laterality Date  . COLONOSCOPY  last one 07-15-2016  . ESOPHAGOGASTRODUODENOSCOPY  last one 2008  . FASCIECTOMY Left 08/24/2016   Procedure: LEFT HAND PALMER AND SMALL FINGER DIGiTAL FASCIECTOMY AND RELEASE;  Surgeon: Iran Planas, MD;  Location: [facility];  Service: Orthopedics;  Laterality: Left;  . LAPAROSCOPIC CHOLECYSTECTOMY  07/17/2000  . ROTATOR CUFF REPAIR Right  2000  . TOTAL ABDOMINAL HYSTERECTOMY W/ BILATERAL SALPINGOOPHORECTOMY  1998    Her Family History Is Significant For: Family History  Problem Relation Age of Onset  . Diabetes Mother   . Hypertension Mother   . Dementia Mother   . Pancreatitis Mother   . Asthma Mother   . COPD Mother   . Stroke Mother   . Hyperlipidemia Mother   . Heart disease Mother   . Hypertension Father   . Hypertension Sister   . Diabetes Sister   . Hyperlipidemia Sister   . Hypertension Brother   . Diabetes  Brother   . Drug abuse Brother   . Cancer Brother        colon  . Hyperlipidemia Brother   . Colon cancer Brother 57  . Hyperlipidemia Brother   . Hypertension Brother   . Hyperlipidemia Sister   . Hypertension Sister   . Diabetes Sister   . Diabetes Maternal Grandmother   . Colon cancer Maternal Grandmother   . Breast cancer Neg Hx   . Esophageal cancer Neg Hx   . Stomach cancer Neg Hx     Her Social History Is Significant For: Social History   Socioeconomic History  . Marital status: Married    Spouse name: Louie Casa  . Number of children: 0  . Years of education: 20  . Highest education level: Not on file  Occupational History  . Occupation: Dispensing optician: [employer]     Comment: Civil engineer, contracting  Social Needs  . Financial resource strain: Not on file  . Food insecurity    Worry: Not on file    Inability: Not on file  . Transportation needs    Medical: Not on file    Non-medical: Not on file  Tobacco Use  . Smoking status: Current Every Day Smoker    Packs/day: 0.25    Years: 28.00    Pack years: 7.00    Types: Cigarettes  . Smokeless tobacco: Never Used  Substance and Sexual Activity  . Alcohol use: No    Alcohol/week: 0.0 standard drinks  . Drug use: No  . Sexual activity: Yes    Partners: Male    Birth control/protection: Surgical  Lifestyle  . Physical activity    Days per week: Not on file    Minutes per session: Not on file  . Stress: Not on file  Relationships  . Social Herbalist on phone: Not on file    Gets together: Not on file    Attends religious service: Not on file    Active member of club or organization: Not on file    Attends meetings of clubs or organizations: Not on file    Relationship status: Not on file  Other Topics Concern  . Not on file  Social History Narrative   Currently living alone, separated from husband.  One story home.  [name]'s children are adults-2 live in [place], 2 live in  [place].     Works for a [employer].     Education: 2 years of college.    Her Allergies Are:  Allergies  Allergen Reactions  . Metformin And Related Diarrhea  . Shellfish Allergy Anaphylaxis, Hives and Swelling    Lobster, crab (shrimp's ok)  . Ivp Dye [Iodinated Diagnostic Agents] Hives  . Adhesive [Tape] Other (See Comments)    blisters  . Chantix [Varenicline] Other (See Comments)    depression  . Codeine Other (See Comments)  Unknown childhood allergy  . Hydrocodone-Acetaminophen   . Latex Other (See Comments)    blisters  . Penicillins Other (See Comments)    Unknown childhood reaction  :   Her Current Medications Are:  Outpatient Encounter Medications as of 10/03/2019  Medication Sig  . acetaminophen (TYLENOL) 500 MG tablet Take 1 tablet (500 mg total) by mouth every 6 (six) hours as needed for mild pain or moderate pain.  Marland Kitchen atorvastatin (LIPITOR) 40 MG tablet Take 1 tablet (40 mg total) by mouth daily.  . Blood Glucose Monitoring Suppl (ONETOUCH VERIO) w/Device KIT Use to check blood sugar daily.  . Cholecalciferol (VITAMIN D3) 2000 units TABS Take 2,000 Units by mouth daily.  . empagliflozin (JARDIANCE) 10 MG TABS tablet Take 10 mg by mouth daily before breakfast.  . gabapentin (NEURONTIN) 100 MG capsule Take 2 capsules (200 mg total) by mouth at bedtime.  Marland Kitchen glimepiride (AMARYL) 4 MG tablet Take 2 tablets (8 mg total) by mouth daily before breakfast.  . glucose blood (ONETOUCH VERIO) test strip Use as instructed to check blood sugar daily.  . hydrocortisone valerate cream (WESTCORT) 0.2 % Apply 1 application topically 2 (two) times daily.  . Insulin Glargine (LANTUS SOLOSTAR) 100 UNIT/ML Solostar Pen Inject 33 Units into the skin at bedtime.  . Insulin Pen Needle (BD ULTRA-FINE PEN NEEDLES) 29G X 12.7MM MISC 1 each by Does not apply route 3 (three) times daily.  . Insulin Syringe-Needle U-100 (B-D INS SYRINGE 0.5CC/31GX5/16) 31G X 5/16" 0.5 ML MISC 1 each by  Does not apply route at bedtime.  Elmore Guise Devices (ACCU-CHEK SOFTCLIX) lancets Use as instructed  . meloxicam (MOBIC) 15 MG tablet TAKE 1 TABLET BY MOUTH DAILY.  . methocarbamol (ROBAXIN) 500 MG tablet Take 1 tablet (500 mg total) by mouth every 8 (eight) hours as needed for muscle spasms.  . Multiple Vitamins-Minerals (MULTIVITAMIN WITH MINERALS) tablet Take 1 tablet by mouth every other day.   [name] Delica Lancets 15B MISC Use to check blood sugar daily.  Marland Kitchen VIVELLE-DOT 0.025 MG/24HR PLACE 1 PATCH ONTO THE SKIN 2 TIMES A WEEK.  . sitaGLIPtin (JANUVIA) 100 MG tablet Take 1 tablet (100 mg total) by mouth daily. (Patient not taking: Reported on 10/03/2019)   Facility-Administered Encounter Medications as of 10/03/2019  Medication  . 0.9 %  sodium chloride infusion  :   Review of Systems:  Out of a complete 14 point review of systems, all are reviewed and negative with the exception of these symptoms as listed below:  Review of Systems  Neurological:       Pt presents today to discuss her unsteadiness. Pt thinks the right leg sometimes gives out on her.    Objective:  Neurological Exam  Physical Exam Physical Examination:   Vitals:   10/03/19 1446  BP: 107/60  Pulse: 72   General Examination: The patient is a very pleasant 63 y.o. female in no acute distress. She appears well-developed and well-nourished and well groomed.   HEENT: Normocephalic, atraumatic, pupils are equal, round and reactive to light and accommodation. Funduscopic exam is Limited and difficult secondary to mild bilateral cataracts.  Extraocular tracking is well preserved, no nystagmus is noted.  She has intact hearing.  Face is symmetrical with normal facial animation and normal sensation to light touch, pinprick, temperature and vibration.  Speech is clear without dysarthria or hypophonia or voice tremor.  Neck is supple with full range of motion, no carotid bruits. Oropharynx exam reveals: moderate mouth  dryness, adequate dental hygiene and Full dentures.  Tongue protrudes centrally in palate elevates symmetrically.   Chest: Clear to auscultation without wheezing, rhonchi or crackles noted.  Heart: S1+S2+0, regular and normal without murmurs, rubs or gallops noted.   Abdomen: Soft, non-tender and non-distended with normal bowel sounds appreciated on auscultation.  Extremities: There is no pitting edema in the distal lower extremities bilaterally. Pedal pulses are intact.  Skin: Warm and dry without trophic changes noted. There are no varicose veins.  Musculoskeletal: exam reveals: Evidence of bilateral Dupuytren's contracture, left side status post surgery.  Neurologically:  Mental status: The patient is awake, alert and oriented in all 4 spheres. Her immediate and remote memory, attention, language skills and fund of knowledge are appropriate. There is no evidence of aphasia, agnosia, apraxia or anomia. Speech is clear with normal prosody and enunciation. Thought process is linear. Mood is normal and affect is normal.  Cranial nerves II - XII are as described above under HEENT exam. In addition: shoulder shrug is normal with equal shoulder height noted. Motor exam: Normal bulk, strength and tone is noted. There is no drift, tremor or rebound. Romberg is negative. Reflexes are 2+ throughout. Babinski: Toes are flexor bilaterally. Fine motor skills and coordination: intact with normal finger taps, normal hand movements, normal rapid alternating patting, normal foot taps and normal foot agility.  Cerebellar testing: No dysmetria or intention tremor on finger to nose testing. Heel to shin is unremarkable bilaterally. There is no truncal or gait ataxia.  Sensory exam: intact to light touch, pinprick, vibration, temperature sense in the upper and lower extremities.  Gait, station and balance: She stands easily. No veering to one side is noted. No leaning to one side is noted. Posture is  age-appropriate and stance is narrow based. Gait shows normal stride length and normal pace. No problems turning are noted. Tandem walk is Slightly challenging but doable.unremarkable. Intact toe and heel stance is noted.               Assessment and Plan:    In summary, Bailey [name] is a very pleasant 63 y.o.-year old female with an underlying medical history of vitamin D deficiency, uncontrolled diabetes, history of carpal tunnel syndrome on the right, hypertriglyceridemia, hyperlipidemia, Dupuytren's contractures with status post surgery on the left side, cervical radiculopathy, arthritis, history of gastric ulcer, and borderline overweight state, who Presents for evaluation of her intermittent gait unsteadiness.  She reports a one-year history of the right leg giving out from time to time, it feels like it happens in the knee area.  She has some intermittent right knee pain but no obvious evidence of swelling or arthritis currently.  She denies any pain.  She denies radiating low back pain.  She denies one-sided weakness or numbness or tingling.  She denies any burning sensation in her feet or hands. Her neurological exam is largely nonfocal, she does not have any sensory deficits.  She has no weakness.  He she has preserved reflexes.  Her history and examination does not support an obvious primary neurological cause of her symptoms.  She is largely reassured from my end of things today.  She is at risk for diabetic neuropathy given her uncontrolled diabetes.  She is working on diabetes control and her random blood sugar values have been improving, she reports that she is keeping a log at Home.  She has a follow-up appointment pending and also is scheduled to have a formal eye examination done.  I suggested we Could certainly consider EMG nerve conduction testing to the lower extremities if needed but for now, I would not really change her management as strict and ongoing good diabetes  control are going to be key for her.  She agrees to monitor her symptoms.  She may benefit from seeing an orthopedic doctor for right knee pain should she have ongoing issues with it or more sustained pain in the knee joint.  For now, I plan to see her Back as needed.  I answered all her questions today and she was in agreement.  Thank you very much for allowing me to participate in the care of this nice patient. If I can be of any further assistance to you please do not hesitate to call me at [phone].  Sincerely,   Star Age, MD, PhD

## 2019-10-04 ENCOUNTER — Ambulatory Visit
Admission: RE | Admit: 2019-10-04 | Discharge: 2019-10-04 | Disposition: A | Discharge status: Home / Self Care | Payer: Medicare Other | Source: Ambulatory Visit | Attending: Internal Medicine | Admitting: Internal Medicine

## 2019-10-04 DIAGNOSIS — Z1231 Encounter for screening mammogram for malignant neoplasm of breast: Secondary | ICD-10-CM

## 2019-10-08 ENCOUNTER — Telehealth: Payer: Self-pay

## 2019-10-08 LAB — HM DIABETES EYE EXAM

## 2019-10-08 NOTE — Telephone Encounter (Signed)
Contacted pt to go over MM results pt didn't answer left a detailed vm informing pt os results and if she has any questions or concerns to give me a call

## 2019-10-09 ENCOUNTER — Telehealth: Payer: Self-pay | Admitting: Internal Medicine

## 2019-10-09 NOTE — Telephone Encounter (Signed)
I will forward this information to [place] in our pharmacy. [name] required a PA, so we requested for Dr. Wynetta Emery to change to [place].   I attempted to call the number provided and was disconnected.

## 2019-10-09 NOTE — Telephone Encounter (Signed)
Bailey [name] may you take care of this for me please

## 2019-10-09 NOTE — Telephone Encounter (Signed)
Michael from [facility] called to request a discontinue reason on  -dapagliflozin propanediol (FARXIGA) 5 MG TABS tablet, please follow up  Phone-[phone] Ref FL:[phone]

## 2019-10-25 MED FILL — ONE TOUCH VERIO TEST STRIP: 25 days supply | Qty: 100 | Fill #1

## 2019-10-30 ENCOUNTER — Telehealth: Payer: Self-pay | Admitting: Internal Medicine

## 2019-10-30 NOTE — Telephone Encounter (Signed)
These two prescriptions are available for refills at her pharmacy. She should call them to have these refills.   Georgina Pillion indicates that the patient wants to stop her Jardiance. She has a follow-up scheduled with Dr. Wynetta Emery next month. Do you need to speak with her before then?

## 2019-10-30 NOTE — Telephone Encounter (Signed)
1) Medication(s) Requested (by name): -sitaGLIPtin (JANUVIA) 100 MG tablet  -atorvastatin (LIPITOR) 40 MG tablet states she needs it with calcium  2) Pharmacy of Choice: -[facility], [address]   Pt states she wants to stop taking  -empagliflozin (JARDIANCE) 10 MG TABS tablet

## 2019-11-01 NOTE — Telephone Encounter (Signed)
I will forward this message to pcp regarding pt is wanting to stop her [place]

## 2019-11-04 ENCOUNTER — Telehealth: Payer: Self-pay | Admitting: Internal Medicine

## 2019-11-04 NOTE — Telephone Encounter (Signed)
Rolm Gala could please take care of this please

## 2019-11-04 NOTE — Telephone Encounter (Addendum)
Called pt to schedule appt, she states she wants to keep her December appt and does not feel the need to speak with pcp before. Asked why she was trying to stop Jardiance 10 MG, pt states she is on a lot of medications such as Januvia. Pt also does not remember if this medication was denied from her insurance but will call back when she verifies with her pharmacy. She agreed to continue medication until her appt in December as she denies any side affects or any other issues with this medication.

## 2019-11-04 NOTE — Telephone Encounter (Signed)
Will forward to pcp

## 2019-12-02 MED FILL — ONE TOUCH VERIO TEST STRIP: 25 days supply | Qty: 100 | Fill #2

## 2019-12-02 MED FILL — JANUVIA 100 MG TABLET: 100 | 30 days supply | Qty: 30 | Fill #2

## 2019-12-13 ENCOUNTER — Ambulatory Visit (HOSPITAL_BASED_OUTPATIENT_CLINIC_OR_DEPARTMENT_OTHER): Payer: Medicare Other | Admitting: Pharmacist

## 2019-12-13 ENCOUNTER — Encounter: Payer: Self-pay | Admitting: Internal Medicine

## 2019-12-13 ENCOUNTER — Other Ambulatory Visit: Payer: Self-pay

## 2019-12-13 ENCOUNTER — Ambulatory Visit: Payer: Medicare Other | Attending: Internal Medicine | Admitting: Internal Medicine

## 2019-12-13 VITALS — BP 126/79 | HR 71 | Resp 16 | Wt 140.0 lb

## 2019-12-13 DIAGNOSIS — E11649 Type 2 diabetes mellitus with hypoglycemia without coma: Secondary | ICD-10-CM

## 2019-12-13 DIAGNOSIS — I10 Essential (primary) hypertension: Secondary | ICD-10-CM

## 2019-12-13 DIAGNOSIS — F17200 Nicotine dependence, unspecified, uncomplicated: Secondary | ICD-10-CM

## 2019-12-13 DIAGNOSIS — E1169 Type 2 diabetes mellitus with other specified complication: Secondary | ICD-10-CM | POA: Diagnosis not present

## 2019-12-13 DIAGNOSIS — R232 Flushing: Secondary | ICD-10-CM | POA: Diagnosis not present

## 2019-12-13 DIAGNOSIS — E785 Hyperlipidemia, unspecified: Secondary | ICD-10-CM

## 2019-12-13 DIAGNOSIS — E119 Type 2 diabetes mellitus without complications: Secondary | ICD-10-CM

## 2019-12-13 DIAGNOSIS — Z794 Long term (current) use of insulin: Secondary | ICD-10-CM

## 2019-12-13 DIAGNOSIS — Z23 Encounter for immunization: Secondary | ICD-10-CM

## 2019-12-13 DIAGNOSIS — F17210 Nicotine dependence, cigarettes, uncomplicated: Secondary | ICD-10-CM | POA: Diagnosis not present

## 2019-12-13 LAB — POCT GLYCOSYLATED HEMOGLOBIN (HGB A1C): HbA1c, POC (controlled diabetic range): 7.4 % — AB (ref 0.0–7.0)

## 2019-12-13 LAB — GLUCOSE, POCT (MANUAL RESULT ENTRY): POC Glucose: 118 mg/dl — AB (ref 70–99)

## 2019-12-13 MED ORDER — NICOTINE 21 MG/24HR TD PT24: 21.0000 mg | MEDICATED_PATCH | Freq: Every day | TRANSDERMAL | 0 refills | Status: DC

## 2019-12-13 MED ORDER — GABAPENTIN 300 MG PO CAPS: 300.0000 mg | ORAL_CAPSULE | Freq: Every day | ORAL | 3 refills | Status: DC

## 2019-12-13 MED ORDER — SITAGLIPTIN PHOSPHATE 100 MG PO TABS: 100.0000 mg | ORAL_TABLET | Freq: Every day | ORAL | 11 refills | Status: DC

## 2019-12-13 MED ORDER — JARDIANCE 10 MG PO TABS: 10.0000 mg | ORAL_TABLET | Freq: Every day | ORAL | 6 refills | Status: DC

## 2019-12-13 NOTE — Patient Instructions (Addendum)
I have sent the prescription to the pharmacy for the nicotine patch for you to start using.  Try not to smoke while you have a patch on.  We have increased the gabapentin to 300 mg at bedtime to help decrease the hot flashes.  Pneumococcal Conjugate Vaccine (PCV13): What You Need to Know 1. Why get vaccinated? Pneumococcal conjugate vaccine (PCV13) can prevent pneumococcal disease. Pneumococcal disease refers to any illness caused by pneumococcal bacteria. These bacteria can cause many types of illnesses, including pneumonia, which is an infection of the lungs. Pneumococcal bacteria are one of the most common causes of pneumonia. Besides pneumonia, pneumococcal bacteria can also cause:  Ear infections  Sinus infections  Meningitis (infection of the tissue covering the brain and spinal cord)  Bacteremia (bloodstream infection) Anyone can get pneumococcal disease, but children under 19 years of age, people with certain medical conditions, adults 35 years or older, and cigarette smokers are at the highest risk. Most pneumococcal infections are mild. However, some can result in long-term problems, such as brain damage or hearing loss. Meningitis, bacteremia, and pneumonia caused by pneumococcal disease can be fatal. 2. PCV13 PCV13 protects against 13 types of bacteria that cause pneumococcal disease. Infants and young children usually need 4 doses of pneumococcal conjugate vaccine, at 2, 4, 6, and 20-1 months of age. In some cases, a child might need fewer than 4 doses to complete PCV13 vaccination. A dose of PCV23 vaccine is also recommended for anyone 2 years or older with certain medical conditions if they did not already receive PCV13. This vaccine may be given to adults 74 years or older based on discussions between the patient and health care provider. 3. Talk with your health care provider Tell your vaccine provider if the person getting the vaccine:  Has had an allergic reaction after  a previous dose of PCV13, to an earlier pneumococcal conjugate vaccine known as PCV7, or to any vaccine containing diphtheria toxoid (for example, DTaP), or has any severe, life-threatening allergies.  In some cases, your health care provider may decide to postpone PCV13 vaccination to a future visit. People with minor illnesses, such as a cold, may be vaccinated. People who are moderately or severely ill should usually wait until they recover before getting PCV13. Your health care provider can give you more information. 4. Risks of a vaccine reaction  Redness, swelling, pain, or tenderness where the shot is given, and fever, loss of appetite, fussiness (irritability), feeling tired, headache, and chills can happen after PCV13. Young children may be at increased risk for seizures caused by fever after PCV13 if it is administered at the same time as inactivated influenza vaccine. Ask your health care provider for more information. People sometimes faint after medical procedures, including vaccination. Tell your provider if you feel dizzy or have vision changes or ringing in the ears. As with any medicine, there is a very remote chance of a vaccine causing a severe allergic reaction, other serious injury, or death. 5. What if there is a serious problem? An allergic reaction could occur after the vaccinated person leaves the clinic. If you see signs of a severe allergic reaction (hives, swelling of the face and throat, difficulty breathing, a fast heartbeat, dizziness, or weakness), call 9-1-1 and get the person to the nearest hospital. For other signs that concern you, call your health care provider. Adverse reactions should be reported to the Vaccine Adverse Event Reporting System (VAERS). Your health care provider will usually file this report, or  you can do it yourself. Visit the VAERS website at www.vaers.SamedayNews.es or call 859 058 5511. VAERS is only for reporting reactions, and VAERS staff do not  give medical advice. 6. The National Vaccine Injury Compensation Program The [employer] Vaccine Injury Compensation Program (VICP) is a federal program that was created to compensate people who may have been injured by certain vaccines. Visit the VICP website at GoldCloset.com.ee or call 878 058 0276 to learn about the program and about filing a claim. There is a time limit to file a claim for compensation. 7. How can I learn more?  Ask your health care provider.  Call your local or state health department.  Contact the Centers for Disease Control and Prevention (CDC): ? Call 202 053 4489 (1-800-CDC-INFO) or ? Visit CDC's website at http://hunter.com/ Vaccine Information Statement PCV13 Vaccine (10/24/2018) This information is not intended to replace advice given to you by your health care provider. Make sure you discuss any questions you have with your health care provider. Document Released: 10/09/2006 Document Revised: 04/02/2019 Document Reviewed: 07/24/2018 Elsevier Patient Education  2020 [employer].

## 2019-12-13 NOTE — Progress Notes (Signed)
Patient presents for vaccination against strep pneumo per orders of Dr. Johnson. Consent given. Counseling provided. No contraindications exists. Vaccine administered without incident.   

## 2019-12-13 NOTE — Progress Notes (Signed)
Patient ID: Bailey [name], female    DOB: 10/28/1956  MRN: [id number]  CC: Diabetes and Hypertension   Subjective: Bailey [name] is a 63 y.o. female who presents for chronic disease management Her concerns today include:  Pt with hx of DM, HL, OA (RT shoulder and knees), gastric ulcer, Vit D def, tob dep  DIABETES TYPE 2 Last A1C:   Results for orders placed or performed in visit on 12/13/19  Glucose (CBG)  Result Value Ref Range   POC Glucose 118 (A) 70 - 99 mg/dl  HgB A1c  Result Value Ref Range   Hemoglobin A1C     HbA1c POC (<> result, manual entry)     HbA1c, POC (prediabetic range)     HbA1c, POC (controlled diabetic range) 7.4 (A) 0.0 - 7.0 %    Med Adherence:  '[x]'  Yes   But does not have meds with her.  Not sure if she has Jardiance but reports compliance with the other medications. Medication side effects:  '[]'  Yes    '[x]'  No Home Monitoring?  '[x]'  Yes    Before BF and at bedtime.  She has log with her. Home glucose results range: 90-150, few in the 70s.  Before bedtime 90-180 Diet Adherence: '[x]'  Yes    '[]'  No Exercise: '[x]'  Yes  -walks in her neighborhood Hypoglycemic episodes?: '[x]'  Yes    '[]'  No Numbness of the feet? '[]'  Yes    '[x]'  No Retinopathy hx? '[]'  Yes    '[]'  No Last eye exam:  Comments:   Complains of hot flashes.  She reports being on estrogen in the past after she had a hysterectomy.  She is now on low-dose gabapentin which helps a little.  She found the estrogen more effective.  Tob dep: still smoking.  She has some increased stress taking care of her sister but she does want to quit.  She has tried Chantix in the past and it made her depressed.  She would like to try nicotine patches.   Patient Active Problem List   Diagnosis Date Noted  . Tobacco dependence 10/05/2017  . Spinal stenosis of lumbar region with neurogenic claudication 10/05/2017  . Osteoarthritis of spine with radiculopathy, lumbar region 10/05/2017  . Plantar wart of right  foot 06/01/2017  . Right sided weakness 12/24/2015  . Dyslipidemia with low high density lipoprotein (HDL) cholesterol with hypertriglyceridemia due to type 2 diabetes mellitus ([place]) 11/16/2015  . Uncontrolled type 2 diabetes mellitus with hyperglycemia ([place]) 11/12/2015  . Menopausal symptoms 11/12/2015  . Dupuytren's contracture of both hands 07/05/2013  . Need for Tdap vaccination 07/05/2013  . Arthritis      Current Outpatient Medications on File Prior to Visit  Medication Sig Dispense Refill  . acetaminophen (TYLENOL) 500 MG tablet Take 1 tablet (500 mg total) by mouth every 6 (six) hours as needed for mild pain or moderate pain. 30 tablet 0  . atorvastatin (LIPITOR) 40 MG tablet Take 1 tablet (40 mg total) by mouth daily. 30 tablet 6  . Blood Glucose Monitoring Suppl (ONETOUCH VERIO) w/Device KIT Use to check blood sugar daily. 1 kit 0  . Cholecalciferol (VITAMIN D3) 2000 units TABS Take 2,000 Units by mouth daily. 30 tablet 11  . empagliflozin (JARDIANCE) 10 MG TABS tablet Take 10 mg by mouth daily before breakfast. 30 tablet 6  . gabapentin (NEURONTIN) 100 MG capsule Take 2 capsules (200 mg total) by mouth at bedtime. 60 capsule 6  . glimepiride (AMARYL) 4  MG tablet Take 2 tablets (8 mg total) by mouth daily before breakfast. 60 tablet 6  . glucose blood (ONETOUCH VERIO) test strip Use as instructed to check blood sugar daily. 100 each 11  . hydrocortisone valerate cream (WESTCORT) 0.2 % Apply 1 application topically 2 (two) times daily. 45 g 0  . Insulin Glargine (LANTUS SOLOSTAR) 100 UNIT/ML Solostar Pen Inject 33 Units into the skin at bedtime. 15 mL 6  . Insulin Pen Needle (BD ULTRA-FINE PEN NEEDLES) 29G X 12.7MM MISC 1 each by Does not apply route 3 (three) times daily. 100 each 12  . Insulin Syringe-Needle U-100 (B-D INS SYRINGE 0.5CC/31GX5/16) 31G X 5/16" 0.5 ML MISC 1 each by Does not apply route at bedtime. 30 each 5  . Lancet Devices (ACCU-CHEK SOFTCLIX) lancets Use as  instructed 1 each 0  . meloxicam (MOBIC) 15 MG tablet TAKE 1 TABLET BY MOUTH DAILY. 30 tablet 0  . methocarbamol (ROBAXIN) 500 MG tablet Take 1 tablet (500 mg total) by mouth every 8 (eight) hours as needed for muscle spasms. 30 tablet 0  . Multiple Vitamins-Minerals (MULTIVITAMIN WITH MINERALS) tablet Take 1 tablet by mouth every other day.     [name] Delica Lancets 52D MISC Use to check blood sugar daily. 100 each 11  . sitaGLIPtin (JANUVIA) 100 MG tablet Take 1 tablet (100 mg total) by mouth daily. (Patient not taking: Reported on 10/03/2019) 90 tablet 3  . VIVELLE-DOT 0.025 MG/24HR PLACE 1 PATCH ONTO THE SKIN 2 TIMES A WEEK. 8 patch 12   Current Facility-Administered Medications on File Prior to Visit  Medication Dose Route Frequency Provider Last Rate Last Admin  . 0.9 %  sodium chloride infusion  500 mL Intravenous Once Nandigam, Venia Minks, MD        Allergies  Allergen Reactions  . Metformin And Related Diarrhea  . Shellfish Allergy Anaphylaxis, Hives and Swelling    Lobster, crab (shrimp's ok)  . Ivp Dye [Iodinated Diagnostic Agents] Hives  . Adhesive [Tape] Other (See Comments)    blisters  . Chantix [Varenicline] Other (See Comments)    depression  . Codeine Other (See Comments)    Unknown childhood allergy  . Hydrocodone-Acetaminophen   . Latex Other (See Comments)    blisters  . Penicillins Other (See Comments)    Unknown childhood reaction    Social History   Socioeconomic History  . Marital status: Married    Spouse name: Louie Casa  . Number of children: 0  . Years of education: 32  . Highest education level: Not on file  Occupational History  . Occupation: Dispensing optician: [employer]     Comment: Civil engineer, contracting  Tobacco Use  . Smoking status: Current Every Day Smoker    Packs/day: 0.25    Years: 28.00    Pack years: 7.00    Types: Cigarettes  . Smokeless tobacco: Never Used  Substance and Sexual Activity  . Alcohol use: No     Alcohol/week: 0.0 standard drinks  . Drug use: No  . Sexual activity: Yes    Partners: Male    Birth control/protection: Surgical  Other Topics Concern  . Not on file  Social History Narrative   Currently living alone, separated from husband.  One story home.  [name]'s children are adults-2 live in [place], 2 live in [place].     Works for a [employer].     Education: 2 years of college.   Social Determinants of Health  Financial Resource Strain:   . Difficulty of Paying Living Expenses: Not on file  Food Insecurity:   . Worried About Charity fundraiser in the Last Year: Not on file  . Ran Out of Food in the Last Year: Not on file  Transportation Needs:   . Lack of Transportation (Medical): Not on file  . Lack of Transportation (Non-Medical): Not on file  Physical Activity:   . Days of Exercise per Week: Not on file  . Minutes of Exercise per Session: Not on file  Stress:   . Feeling of Stress : Not on file  Social Connections:   . Frequency of Communication with Friends and Family: Not on file  . Frequency of Social Gatherings with Friends and Family: Not on file  . Attends Religious Services: Not on file  . Active Member of Clubs or Organizations: Not on file  . Attends Archivist Meetings: Not on file  . Marital Status: Not on file  Intimate Partner Violence:   . Fear of Current or Ex-Partner: Not on file  . Emotionally Abused: Not on file  . Physically Abused: Not on file  . Sexually Abused: Not on file    Family History  Problem Relation Age of Onset  . Diabetes Mother   . Hypertension Mother   . Dementia Mother   . Pancreatitis Mother   . Asthma Mother   . COPD Mother   . Stroke Mother   . Hyperlipidemia Mother   . Heart disease Mother   . Hypertension Father   . Hypertension Sister   . Diabetes Sister   . Hyperlipidemia Sister   . Hypertension Brother   . Diabetes Brother   . Drug abuse Brother   . Cancer Brother        colon  .  Hyperlipidemia Brother   . Colon cancer Brother 41  . Hyperlipidemia Brother   . Hypertension Brother   . Hyperlipidemia Sister   . Hypertension Sister   . Diabetes Sister   . Diabetes Maternal Grandmother   . Colon cancer Maternal Grandmother   . Breast cancer Neg Hx   . Esophageal cancer Neg Hx   . Stomach cancer Neg Hx     Past Surgical History:  Procedure Laterality Date  . COLONOSCOPY  last one 07-15-2016  . ESOPHAGOGASTRODUODENOSCOPY  last one 2008  . FASCIECTOMY Left 08/24/2016   Procedure: LEFT HAND PALMER AND SMALL FINGER DIGiTAL FASCIECTOMY AND RELEASE;  Surgeon: Iran Planas, MD;  Location: [facility];  Service: Orthopedics;  Laterality: Left;  . LAPAROSCOPIC CHOLECYSTECTOMY  07/17/2000  . ROTATOR CUFF REPAIR Right 2000  . TOTAL ABDOMINAL HYSTERECTOMY W/ BILATERAL SALPINGOOPHORECTOMY  1998    ROS: Review of Systems Negative except as stated above  PHYSICAL EXAM: BP 126/79   Pulse 71   Resp 16   Wt 140 lb (63.5 kg)   SpO2 99%   BMI 25.61 kg/m   Wt Readings from Last 3 Encounters:  12/13/19 140 lb (63.5 kg)  10/03/19 139 lb (63 kg)  09/17/19 137 lb 9.6 oz (62.4 kg)    Physical Exam  General appearance - alert, well appearing, and in no distress Mental status - normal mood, behavior, speech, dress, motor activity, and thought processes Neck - supple, no significant adenopathy Chest - clear to auscultation, no wheezes, rales or rhonchi, symmetric air entry Heart - normal rate, regular rhythm, normal S1, S2, no murmurs, rubs, clicks or gallops Extremities - peripheral pulses normal,  no pedal edema, no clubbing or cyanosis   CMP Latest Ref Rng & Units 08/21/2019 07/06/2018 07/04/2017  Glucose 65 - 99 mg/dL 365(H) 143(H) 202(H)  BUN 8 - 27 mg/dL '11 9 10  ' Creatinine 0.57 - 1.00 mg/dL 0.65 0.79 0.79  Sodium 134 - 144 mmol/L 138 145(H) 141  Potassium 3.5 - 5.2 mmol/L 4.3 3.8 4.2  Chloride 96 - 106 mmol/L 100 106 103  CO2 20 - 29 mmol/L '21 25 23   ' Calcium 8.7 - 10.3 mg/dL 9.2 9.3 9.3  Total Protein 6.0 - 8.5 g/dL 6.8 6.9 7.1  Total Bilirubin 0.0 - 1.2 mg/dL 0.2 0.3 0.4  Alkaline Phos 39 - 117 IU/L 103 86 83  AST 0 - 40 IU/L '18 28 24  ' ALT 0 - 32 IU/L 20 41(H) 32   Lipid Panel     Component Value Date/Time   CHOL 218 (H) 08/21/2019 1510   TRIG 548 (H) 08/21/2019 1510   HDL 34 (L) 08/21/2019 1510   CHOLHDL 6.4 (H) 08/21/2019 1510   CHOLHDL 7.0 (H) 11/12/2015 1153   VLDL NOT CALC 11/12/2015 1153   LDLCALC Comment 08/21/2019 1510   LDLDIRECT 45 06/10/2016 1213    CBC    Component Value Date/Time   WBC 6.6 07/06/2018 0930   WBC 7.6 08/22/2015 1009   RBC 4.45 07/06/2018 0930   RBC 4.66 08/22/2015 1009   HGB 14.1 07/06/2018 0930   HCT 40.3 07/06/2018 0930   PLT 117 (L) 07/06/2018 0930   MCV 91 07/06/2018 0930   MCH 31.7 07/06/2018 0930   MCH 31.1 08/22/2015 1009   MCHC 35.0 07/06/2018 0930   MCHC 33.6 08/22/2015 1009   RDW 14.2 07/06/2018 0930   LYMPHSABS 2.4 08/22/2015 1009   MONOABS 0.7 08/22/2015 1009   EOSABS 0.1 08/22/2015 1009   BASOSABS 0.0 08/22/2015 1009    ASSESSMENT AND PLAN: 1. Type 2 diabetes mellitus without complication, with long-term current use of insulin (HCC) A1c has improved.  I have commended her on this.  Encouraged her to continue healthy eating habits and regular exercise.  She will check her bottles when she gets home to see whether she has the [place].  We agreed not to make any changes in medications at this time.  She will continue the Amaryl, Lantus, Januvia and Jardiance. -She has had few hypoglycemic blood sugars in the 70s.  Discussed signs and symptoms of hypoglycemia and how to treat. - empagliflozin (JARDIANCE) 10 MG TABS tablet; Take 10 mg by mouth daily before breakfast.  Dispense: 30 tablet; Refill: 6  2. Hot flashes Given her age I would hate to put her on estrogen.  We agreed to try increase the dose of the gabapentin instead - gabapentin (NEURONTIN) 300 MG capsule; Take 1  capsule (300 mg total) by mouth at bedtime.  Dispense: 30 capsule; Refill: 3  3. Tobacco dependence Advised to quit.  Discussed health risks associated with smoking.  Patient wants to quit and would like to give a trial of nicotine patches.  I discussed the stepdown method.  We will start her with the 21 mg patches then stepped down to 14 and 7 each for a month.  Less than 5 minutes spent on counseling. - nicotine (NICODERM CQ - DOSED IN MG/24 HOURS) 21 mg/24hr patch; Place 1 patch (21 mg total) onto the skin daily.  Dispense: 28 patch; Refill: 0  4. Hyperlipidemia associated with type 2 diabetes mellitus (HCC) Continue atorvastatin - Lipid panel  5. Need  for vaccination against Streptococcus pneumoniae using pneumococcal conjugate vaccine 13 Given.     Patient was given the opportunity to ask questions.  Patient verbalized understanding of the plan and was able to repeat key elements of the plan.   Orders Placed This Encounter  Procedures  . Glucose (CBG)  . HgB A1c     Requested Prescriptions    No prescriptions requested or ordered in this encounter    No follow-ups on file.  Karle Plumber, MD, FACP

## 2019-12-14 LAB — LIPID PANEL
Chol/HDL Ratio: 2.4 ratio (ref 0.0–4.4)
Cholesterol, Total: 95 mg/dL — ABNORMAL LOW (ref 100–199)
HDL: 40 mg/dL (ref >39)
LDL Chol Calc (NIH): 40 mg/dL (ref 0–99)
Triglycerides: 71 mg/dL (ref 0–149)
VLDL Cholesterol Cal: 15 mg/dL (ref 5–40)

## 2019-12-16 ENCOUNTER — Telehealth: Payer: Self-pay

## 2019-12-16 NOTE — Telephone Encounter (Signed)
Contacted pt to go over lab results pt is aware and doesn't have any questions or concerns 

## 2020-02-28 ENCOUNTER — Telehealth: Payer: Self-pay | Admitting: Oncology

## 2020-02-28 NOTE — Telephone Encounter (Signed)
Received a new hem referral from Simona Huh, NP at [facility] for thrombocytopenia. Pt has been cld and scheduled to see Dr. Alen Blew on 3/19 at 11am. Pt has been made aware to arrive 15 minutes early.

## 2020-03-13 ENCOUNTER — Other Ambulatory Visit: Payer: Self-pay

## 2020-03-13 ENCOUNTER — Inpatient Hospital Stay: Payer: Medicare Other | Attending: Oncology | Admitting: Oncology

## 2020-03-13 VITALS — BP 123/63 | HR 68 | Temp 97.9°F | Resp 18 | Ht 62.0 in | Wt 141.4 lb

## 2020-03-13 DIAGNOSIS — F17210 Nicotine dependence, cigarettes, uncomplicated: Secondary | ICD-10-CM | POA: Diagnosis not present

## 2020-03-13 DIAGNOSIS — E119 Type 2 diabetes mellitus without complications: Secondary | ICD-10-CM | POA: Diagnosis not present

## 2020-03-13 DIAGNOSIS — D696 Thrombocytopenia, unspecified: Secondary | ICD-10-CM | POA: Diagnosis not present

## 2020-03-13 DIAGNOSIS — Z800 Family history of malignant neoplasm of digestive organs: Secondary | ICD-10-CM | POA: Diagnosis not present

## 2020-03-13 DIAGNOSIS — Z794 Long term (current) use of insulin: Secondary | ICD-10-CM | POA: Diagnosis not present

## 2020-03-13 NOTE — Progress Notes (Signed)
Reason for the request:    Thrombocytopenia  HPI: I was asked by Simona Huh, NP to evaluate Bailey [name] for thrombocytopenia.  She is a 64 year old woman with history of diabetes, hyperlipidemia and arthritis was noted to have thrombocytopenia.  She had a CBC on March 2 at that time showed a platelet count of 139.  Normal range is 140.  She had a normal white cell count and normal hemoglobin.  She is asymptomatic from these findings without any bleeding.  She denies any hematochezia, melena or hemoptysis.  She denies any epistaxis or bruising.  Previous counts dating back to 2010 have had fluctuating platelet counts close to normal range and was as low as 117 and 2019.  She does not report any headaches, blurry vision, syncope or seizures. Does not report any fevers, chills or sweats.  Does not report any cough, wheezing or hemoptysis.  Does not report any chest pain, palpitation, orthopnea or leg edema.  Does not report any nausea, vomiting or abdominal pain.  Does not report any constipation or diarrhea.  Does not report any skeletal complaints.    Does not report frequency, urgency or hematuria.  Does not report any skin rashes or lesions. Does not report any heat or cold intolerance.  Does not report any lymphadenopathy or petechiae.  Does not report any anxiety or depression.  Remaining review of systems is negative.    Past Medical History:  Diagnosis Date  . Arthritis    RIGHT shoulder, knees  . Cervical radiculopathy at C5   . Dupuytren's contracture of both hands    L > R  . Dyslipidemia   . History of adenomatous polyp of colon    1997 and 2003    . History of chronic pelvic inflammatory disease   . History of gastric ulcer    2008  . Hypertriglyceridemia   . Right carpal tunnel syndrome   . Type 2 diabetes mellitus, uncontrolled ([place])    last A1c 8.9 on 07-07-2016  . Vitamin D deficiency   :  Past Surgical History:  Procedure Laterality Date  . COLONOSCOPY  last one  07-15-2016  . ESOPHAGOGASTRODUODENOSCOPY  last one 2008  . FASCIECTOMY Left 08/24/2016   Procedure: LEFT HAND PALMER AND SMALL FINGER DIGiTAL FASCIECTOMY AND RELEASE;  Surgeon: Iran Planas, MD;  Location: [facility];  Service: Orthopedics;  Laterality: Left;  . LAPAROSCOPIC CHOLECYSTECTOMY  07/17/2000  . ROTATOR CUFF REPAIR Right 2000  . TOTAL ABDOMINAL HYSTERECTOMY W/ BILATERAL SALPINGOOPHORECTOMY  1998  :   Current Outpatient Medications:  .  acetaminophen (TYLENOL) 500 MG tablet, Take 1 tablet (500 mg total) by mouth every 6 (six) hours as needed for mild pain or moderate pain., Disp: 30 tablet, Rfl: 0 .  atorvastatin (LIPITOR) 40 MG tablet, Take 1 tablet (40 mg total) by mouth daily., Disp: 30 tablet, Rfl: 6 .  Blood Glucose Monitoring Suppl (ONETOUCH VERIO) w/Device KIT, Use to check blood sugar daily., Disp: 1 kit, Rfl: 0 .  Cholecalciferol (VITAMIN D3) 2000 units TABS, Take 2,000 Units by mouth daily., Disp: 30 tablet, Rfl: 11 .  empagliflozin (JARDIANCE) 10 MG TABS tablet, Take 10 mg by mouth daily before breakfast., Disp: 30 tablet, Rfl: 6 .  gabapentin (NEURONTIN) 300 MG capsule, Take 1 capsule (300 mg total) by mouth at bedtime., Disp: 30 capsule, Rfl: 3 .  glimepiride (AMARYL) 4 MG tablet, Take 2 tablets (8 mg total) by mouth daily before breakfast., Disp: 60 tablet, Rfl: 6 .  glucose blood (ONETOUCH VERIO) test strip, Use as instructed to check blood sugar daily., Disp: 100 each, Rfl: 11 .  hydrocortisone valerate cream (WESTCORT) 0.2 %, Apply 1 application topically 2 (two) times daily., Disp: 45 g, Rfl: 0 .  Insulin Glargine (LANTUS SOLOSTAR) 100 UNIT/ML Solostar Pen, Inject 33 Units into the skin at bedtime., Disp: 15 mL, Rfl: 6 .  Insulin Pen Needle (BD ULTRA-FINE PEN NEEDLES) 29G X 12.7MM MISC, 1 each by Does not apply route 3 (three) times daily., Disp: 100 each, Rfl: 12 .  Insulin Syringe-Needle U-100 (B-D INS SYRINGE 0.5CC/31GX5/16) 31G X 5/16" 0.5 ML MISC,  1 each by Does not apply route at bedtime., Disp: 30 each, Rfl: 5 .  Lancet Devices (ACCU-CHEK SOFTCLIX) lancets, Use as instructed, Disp: 1 each, Rfl: 0 .  meloxicam (MOBIC) 15 MG tablet, TAKE 1 TABLET BY MOUTH DAILY., Disp: 30 tablet, Rfl: 0 .  methocarbamol (ROBAXIN) 500 MG tablet, Take 1 tablet (500 mg total) by mouth every 8 (eight) hours as needed for muscle spasms., Disp: 30 tablet, Rfl: 0 .  Multiple Vitamins-Minerals (MULTIVITAMIN WITH MINERALS) tablet, Take 1 tablet by mouth every other day. , Disp: , Rfl:  .  nicotine (NICODERM CQ - DOSED IN MG/24 HOURS) 21 mg/24hr patch, Place 1 patch (21 mg total) onto the skin daily., Disp: 28 patch, Rfl: 0 .  OneTouch Delica Lancets 09W MISC, Use to check blood sugar daily., Disp: 100 each, Rfl: 11 .  sitaGLIPtin (JANUVIA) 100 MG tablet, Take 1 tablet (100 mg total) by mouth daily., Disp: 30 tablet, Rfl: 11 .  VIVELLE-DOT 0.025 MG/24HR, PLACE 1 PATCH ONTO THE SKIN 2 TIMES A WEEK., Disp: 8 patch, Rfl: 12  Current Facility-Administered Medications:  .  0.9 %  sodium chloride infusion, 500 mL, Intravenous, Once, Nandigam, Venia Minks, MD:  Allergies  Allergen Reactions  . Metformin And Related Diarrhea  . Shellfish Allergy Anaphylaxis, Hives and Swelling    Lobster, crab (shrimp's ok)  . Ivp Dye [Iodinated Diagnostic Agents] Hives  . Adhesive [Tape] Other (See Comments)    blisters  . Chantix [Varenicline] Other (See Comments)    depression  . Codeine Other (See Comments)    Unknown childhood allergy  . Hydrocodone-Acetaminophen   . Latex Other (See Comments)    blisters  . Penicillins Other (See Comments)    Unknown childhood reaction  :  Family History  Problem Relation Age of Onset  . Diabetes Mother   . Hypertension Mother   . Dementia Mother   . Pancreatitis Mother   . Asthma Mother   . COPD Mother   . Stroke Mother   . Hyperlipidemia Mother   . Heart disease Mother   . Hypertension Father   . Hypertension Sister   .  Diabetes Sister   . Hyperlipidemia Sister   . Hypertension Brother   . Diabetes Brother   . Drug abuse Brother   . Cancer Brother        colon  . Hyperlipidemia Brother   . Colon cancer Brother 76  . Hyperlipidemia Brother   . Hypertension Brother   . Hyperlipidemia Sister   . Hypertension Sister   . Diabetes Sister   . Diabetes Maternal Grandmother   . Colon cancer Maternal Grandmother   . Breast cancer Neg Hx   . Esophageal cancer Neg Hx   . Stomach cancer Neg Hx   :  Social History   Socioeconomic History  . Marital status: Married  Spouse name: Louie Casa  . Number of children: 0  . Years of education: 78  . Highest education level: Not on file  Occupational History  . Occupation: Dispensing optician: [employer]     Comment: Civil engineer, contracting  Tobacco Use  . Smoking status: Current Every Day Smoker    Packs/day: 0.25    Years: 28.00    Pack years: 7.00    Types: Cigarettes  . Smokeless tobacco: Never Used  Substance and Sexual Activity  . Alcohol use: No    Alcohol/week: 0.0 standard drinks  . Drug use: No  . Sexual activity: Yes    Partners: Male    Birth control/protection: Surgical  Other Topics Concern  . Not on file  Social History Narrative   Currently living alone, separated from husband.  One story home.  [name]'s children are adults-2 live in [place], 2 live in [place].     Works for a [employer].     Education: 2 years of college.   Social Determinants of Health   Financial Resource Strain:   . Difficulty of Paying Living Expenses:   Food Insecurity:   . Worried About Charity fundraiser in the Last Year:   . Arboriculturist in the Last Year:   Transportation Needs:   . Film/video editor (Medical):   Marland Kitchen Lack of Transportation (Non-Medical):   Physical Activity:   . Days of Exercise per Week:   . Minutes of Exercise per Session:   Stress:   . Feeling of Stress :   Social Connections:   . Frequency of  Communication with Friends and Family:   . Frequency of Social Gatherings with Friends and Family:   . Attends Religious Services:   . Active Member of Clubs or Organizations:   . Attends Archivist Meetings:   Marland Kitchen Marital Status:   Intimate Partner Violence:   . Fear of Current or Ex-Partner:   . Emotionally Abused:   Marland Kitchen Physically Abused:   . Sexually Abused:   :  Pertinent items are noted in HPI.  Exam: Blood pressure 123/63, pulse 68, temperature 97.9 F (36.6 C), temperature source Temporal, resp. rate 18, height _0  (1.575 m), weight 141 lb 6.4 oz (64.1 kg), SpO2 100 %.  ECOG 0  General appearance: alert and cooperative appeared without distress. Head: atraumatic without any abnormalities. Eyes: conjunctivae/corneas clear. PERRL.  Sclera anicteric. Throat: lips, mucosa, and tongue normal; without oral thrush or ulcers. Resp: clear to auscultation bilaterally without rhonchi, wheezes or dullness to percussion. Cardio: regular rate and rhythm, S1, S2 normal, no murmur, click, rub or gallop GI: soft, non-tender; bowel sounds normal; no masses,  no organomegaly Skin: Skin color, texture, turgor normal. No rashes or lesions Lymph nodes: Cervical, supraclavicular, and axillary nodes normal. Neurologic: Grossly normal without any motor, sensory or deep tendon reflexes. Musculoskeletal: No joint deformity or effusion.  CBC    Component Value Date/Time   WBC 6.6 07/06/2018 0930   WBC 7.6 08/22/2015 1009   RBC 4.45 07/06/2018 0930   RBC 4.66 08/22/2015 1009   HGB 14.1 07/06/2018 0930   HCT 40.3 07/06/2018 0930   PLT 117 (L) 07/06/2018 0930   MCV 91 07/06/2018 0930   MCH 31.7 07/06/2018 0930   MCH 31.1 08/22/2015 1009   MCHC 35.0 07/06/2018 0930   MCHC 33.6 08/22/2015 1009   RDW 14.2 07/06/2018 0930   LYMPHSABS 2.4 08/22/2015 1009   MONOABS 0.7 08/22/2015 1009  EOSABS 0.1 08/22/2015 1009   BASOSABS 0.0 08/22/2015 1009     Assessment and Plan:     64 year old woman with:  1.  Thrombocytopenia noted on a CBC on February 25, 2020.  Platelet count was 139 with a normal range of 140.  Previous counts dating from 2010 have had fluctuating counts similar to December as low as 117.  The differential diagnosis was reviewed at this time.  These findings likely represents benign fluctuating thrombocytopenia and not a sign of a blood disorder.  I think it could also be a consideration but considered extremely unlikely at this time.  Other considerations such as DIC HUS TTP, MDS or other hematological disorder are also unlikely.  It is very possible that her platelet count normal range fluctuates at this time without any sign of a hematological disorder.  From a management standpoint, I do not recommend any further evaluation or hematological work-up.  2.  Follow-up: As needed in the future.    45  minutes were dedicated to this visit. The time was spent on reviewing laboratory data, discussing treatment options, discussing differential diagnosis and answering questions regarding future plan.     A copy of this consult has been forwarded to the requesting provider.

## 2020-04-11 ENCOUNTER — Other Ambulatory Visit: Payer: Self-pay | Admitting: Family Medicine

## 2020-04-11 DIAGNOSIS — E11649 Type 2 diabetes mellitus with hypoglycemia without coma: Secondary | ICD-10-CM

## 2020-04-23 ENCOUNTER — Other Ambulatory Visit: Payer: Self-pay | Admitting: Internal Medicine

## 2020-04-23 DIAGNOSIS — R232 Flushing: Secondary | ICD-10-CM

## 2020-09-24 ENCOUNTER — Other Ambulatory Visit: Payer: Self-pay | Admitting: Family Medicine

## 2020-09-24 DIAGNOSIS — E11649 Type 2 diabetes mellitus with hypoglycemia without coma: Secondary | ICD-10-CM

## 2020-09-24 NOTE — Telephone Encounter (Signed)
Requested medication (s) are due for refill today:Yes  Requested medication (s) are on the active medication list: Yes  Last refill:  09/17/19  Future visit scheduled: No  Notes to clinic:  Prescription has expired. Pt. Needs OV.    Requested Prescriptions  Pending Prescriptions Disp Refills   LANTUS SOLOSTAR 100 UNIT/ML Solostar Pen [Pharmacy Med Name: LANTUS SOLOSTAR PEN INJ 3ML] 15 mL 6    Sig: ADMINISTER 33 UNITS UNDER THE SKIN AT BEDTIME      Endocrinology:  Diabetes - Insulins Failed - 09/24/2020  3:24 AM      Failed - HBA1C is between 0 and 7.9 and within 180 days    HbA1c, POC (controlled diabetic range)  Date Value Ref Range Status  12/13/2019 7.4 (A) 0.0 - 7.0 % Final          Failed - Valid encounter within last 6 months    Recent Outpatient Visits           9 months ago Need for vaccination against Streptococcus pneumoniae using pneumococcal conjugate vaccine [place], [place] L, RPH-CPP   9 months ago Type 2 diabetes mellitus without complication, with long-term current use of insulin ([place])   [place] Karle Plumber B, MD   1 year ago Uncontrolled type 2 diabetes mellitus with hypoglycemia without coma [facility])   [place], Charlane Ferretti, MD   1 year ago Uncontrolled type 2 diabetes mellitus without complication, with long-term current use of insulin [facility])   [place], [place], [place]   2 years ago Uncontrolled type 2 diabetes mellitus without complication, with long-term current use of insulin [facility])   [place] [facility] Ladell Pier, MD

## 2020-09-29 ENCOUNTER — Other Ambulatory Visit: Payer: Self-pay | Admitting: Nurse Practitioner

## 2020-09-29 DIAGNOSIS — Z1231 Encounter for screening mammogram for malignant neoplasm of breast: Secondary | ICD-10-CM

## 2020-10-20 ENCOUNTER — Other Ambulatory Visit: Payer: Self-pay | Admitting: Internal Medicine

## 2020-10-20 DIAGNOSIS — R232 Flushing: Secondary | ICD-10-CM

## 2020-10-21 ENCOUNTER — Ambulatory Visit
Admission: RE | Admit: 2020-10-21 | Discharge: 2020-10-21 | Disposition: A | Discharge status: Home / Self Care | Payer: Medicare Other | Source: Ambulatory Visit | Attending: Nurse Practitioner | Admitting: Nurse Practitioner

## 2020-10-21 ENCOUNTER — Other Ambulatory Visit: Payer: Self-pay

## 2020-10-21 ENCOUNTER — Ambulatory Visit: Payer: Medicare Other

## 2020-10-21 DIAGNOSIS — Z1231 Encounter for screening mammogram for malignant neoplasm of breast: Secondary | ICD-10-CM

## 2020-11-22 ENCOUNTER — Other Ambulatory Visit: Payer: Self-pay | Admitting: Internal Medicine

## 2020-11-22 DIAGNOSIS — R232 Flushing: Secondary | ICD-10-CM

## 2020-11-22 NOTE — Telephone Encounter (Signed)
Requested medication (s) are due for refill today: yes  Requested medication (s) are on the active medication list: yes  Last refill:  10/20/20 (courtesy RF)  Future visit scheduled: no  Notes to clinic:  Called pt and LM on VM to call office to make appt for further refills   Requested Prescriptions  Pending Prescriptions Disp Refills   gabapentin (NEURONTIN) 300 MG capsule [Pharmacy Med Name: GABAPENTIN 300MG  CAPSULES] 30 capsule 0    Sig: TAKE 1 CAPSULE(300 MG) BY MOUTH AT BEDTIME      Neurology: Anticonvulsants - gabapentin Failed - 11/22/2020  3:20 PM      Failed - Valid encounter within last 12 months    Recent Outpatient Visits           11 months ago Need for vaccination against Streptococcus pneumoniae using pneumococcal conjugate vaccine [place], Annie Main L, RPH-CPP   11 months ago Type 2 diabetes mellitus without complication, with long-term current use of insulin ([place])   [place] Karle Plumber B, MD   1 year ago Uncontrolled type 2 diabetes mellitus with hypoglycemia without coma ([place])   [place], Charlane Ferretti, MD   1 year ago Uncontrolled type 2 diabetes mellitus without complication, with long-term current use of insulin [facility])   [place], [place], [place]   2 years ago Uncontrolled type 2 diabetes mellitus without complication, with long-term current use of insulin [facility])   [place] [facility] Ladell Pier, MD

## 2020-11-25 NOTE — Telephone Encounter (Signed)
Please contact pt and schedule a f/u

## 2020-11-27 ENCOUNTER — Telehealth: Payer: Self-pay | Admitting: Internal Medicine

## 2020-11-27 NOTE — Telephone Encounter (Signed)
Called patient and LVM to call back and schedule follow up with Dr. Wynetta Emery.

## 2020-11-27 NOTE — Telephone Encounter (Signed)
Called patient to schedule an appointment with Dr. Wynetta Emery.

## 2020-12-27 ENCOUNTER — Other Ambulatory Visit: Payer: Self-pay | Admitting: Internal Medicine

## 2020-12-27 DIAGNOSIS — R232 Flushing: Secondary | ICD-10-CM

## 2020-12-28 ENCOUNTER — Other Ambulatory Visit: Payer: Self-pay | Admitting: Internal Medicine

## 2020-12-28 DIAGNOSIS — E119 Type 2 diabetes mellitus without complications: Secondary | ICD-10-CM

## 2020-12-28 DIAGNOSIS — Z794 Long term (current) use of insulin: Secondary | ICD-10-CM

## 2020-12-28 NOTE — Telephone Encounter (Signed)
Requested medication (s) are due for refill today: yes  Requested medication (s) are on the active medication list: yes  Last refill:  11/25/2020  Future visit scheduled: no  Notes to clinic: overdue for office visit Vm left for patient to schedule    Requested Prescriptions  Pending Prescriptions Disp Refills   gabapentin (NEURONTIN) 300 MG capsule [Pharmacy Med Name: GABAPENTIN 300MG  CAPSULES] 30 capsule 0    Sig: TAKE 1 CAPSULE(300 MG) BY MOUTH AT BEDTIME      Neurology: Anticonvulsants - gabapentin Failed - 12/27/2020  4:33 PM      Failed - Valid encounter within last 12 months    Recent Outpatient Visits           1 year ago Need for vaccination against Streptococcus pneumoniae using pneumococcal conjugate vaccine 13   [place] [facility] Bajandas, KOOMBERKINE, RPH-CPP   1 year ago Type 2 diabetes mellitus without complication, with long-term current use of insulin (HCC)   [place] [facility] Cornelius Moras B, MD   1 year ago Uncontrolled type 2 diabetes mellitus with hypoglycemia without coma [facility])   [place] [facility] [place], [place], MD   1 year ago Uncontrolled type 2 diabetes mellitus without complication, with long-term current use of insulin [facility])   [place] [facility] [place], [place], [place]   2 years ago Uncontrolled type 2 diabetes mellitus without complication, with long-term current use of insulin [facility])   [place] [facility], MD

## 2020-12-28 NOTE — Telephone Encounter (Signed)
Requested medication (s) are due for refill today: Yes  Requested medication (s) are on the active medication list: Yes  Last refill:  12/13/19  Future visit scheduled: No  Notes to clinic:  Unable to refill per protocol, appointment needed, Rx expired     Requested Prescriptions  Pending Prescriptions Disp Refills   JANUVIA 100 MG tablet [Pharmacy Med Name: JANUVIA 100MG  TABLETS] 30 tablet 11    Sig: TAKE 1 TABLET(100 MG) BY MOUTH DAILY      Endocrinology:  Diabetes - DPP-4 Inhibitors Failed - 12/28/2020 10:56 AM      Failed - HBA1C is between 0 and 7.9 and within 180 days    HbA1c, POC (controlled diabetic range)  Date Value Ref Range Status  12/13/2019 7.4 (A) 0.0 - 7.0 % Final          Failed - Cr in normal range and within 360 days    Creat  Date Value Ref Range Status  07/24/2014 0.84 0.50 - 1.10 mg/dL Final   Creatinine, Ser  Date Value Ref Range Status  08/21/2019 0.65 0.57 - 1.00 mg/dL Final   Creatinine, Urine  Date Value Ref Range Status  11/12/2015 115 20 - 320 mg/dL Final          Failed - Valid encounter within last 6 months    Recent Outpatient Visits           1 year ago Need for vaccination against Streptococcus pneumoniae using pneumococcal conjugate vaccine 13   [place] [facility] [place], KOOMBERKINE L, RPH-CPP   1 year ago Type 2 diabetes mellitus without complication, with long-term current use of insulin (HCC)   [place] [facility] [place] B, MD   1 year ago Uncontrolled type 2 diabetes mellitus with hypoglycemia without coma (HCC)   [place] [facility] [place], [place], MD   1 year ago Uncontrolled type 2 diabetes mellitus without complication, with long-term current use of insulin [facility])   [place] [facility] [place], [place], [place]   2 years ago Uncontrolled type 2 diabetes mellitus without complication, with long-term current use of insulin  [facility])   [place] [facility], MD

## 2020-12-28 NOTE — Telephone Encounter (Signed)
Patient called, left VM to return the call to schedule a physical. Last OV 12/13/19.

## 2020-12-30 ENCOUNTER — Other Ambulatory Visit: Payer: Self-pay | Admitting: Internal Medicine

## 2020-12-30 DIAGNOSIS — E119 Type 2 diabetes mellitus without complications: Secondary | ICD-10-CM

## 2020-12-30 DIAGNOSIS — Z794 Long term (current) use of insulin: Secondary | ICD-10-CM

## 2020-12-30 NOTE — Telephone Encounter (Signed)
[place] please contact pt and schedule

## 2021-02-05 ENCOUNTER — Other Ambulatory Visit: Payer: Self-pay | Admitting: Internal Medicine

## 2021-02-05 DIAGNOSIS — E119 Type 2 diabetes mellitus without complications: Secondary | ICD-10-CM

## 2021-02-09 ENCOUNTER — Other Ambulatory Visit: Payer: Self-pay | Admitting: Internal Medicine

## 2021-02-09 DIAGNOSIS — Z794 Long term (current) use of insulin: Secondary | ICD-10-CM

## 2021-02-09 DIAGNOSIS — E119 Type 2 diabetes mellitus without complications: Secondary | ICD-10-CM

## 2021-02-09 NOTE — Telephone Encounter (Signed)
Patient called, left VM to return the call to the office to schedule an appointment. Past due for physical, last OV 1 year ago.

## 2021-03-02 ENCOUNTER — Ambulatory Visit: Payer: Medicare Other | Admitting: Internal Medicine

## 2021-03-02 ENCOUNTER — Encounter: Payer: Self-pay | Admitting: Internal Medicine

## 2021-03-02 ENCOUNTER — Other Ambulatory Visit: Payer: Self-pay

## 2021-03-02 ENCOUNTER — Ambulatory Visit: Payer: Medicare Other | Attending: Internal Medicine | Admitting: Internal Medicine

## 2021-03-02 VITALS — BP 120/74 | HR 78 | Resp 16 | Ht 62.0 in | Wt 146.8 lb

## 2021-03-02 DIAGNOSIS — F17210 Nicotine dependence, cigarettes, uncomplicated: Secondary | ICD-10-CM | POA: Diagnosis not present

## 2021-03-02 DIAGNOSIS — Z7189 Other specified counseling: Secondary | ICD-10-CM | POA: Diagnosis not present

## 2021-03-02 DIAGNOSIS — H5203 Hypermetropia, bilateral: Secondary | ICD-10-CM

## 2021-03-02 DIAGNOSIS — Z0000 Encounter for general adult medical examination without abnormal findings: Secondary | ICD-10-CM

## 2021-03-02 NOTE — Progress Notes (Addendum)
Subjective:    Bailey [name] is a 65 y.o. female who presents for a Welcome to Medicare exam.  Patient was last seen in December 2020.  She states that she was hiding out at home due to the Covid pandemic.  Pt with hx of DM, HL, OA (RT shoulder and knees), gastric ulcer, Vit D def, tob dep Review of Systems  HEENT: Endorses seeing things close up.  She has to wear reading glasses.  She is overdue for diabetic eye exam.  Usually sees Bailey [name] -No nasal or sinus congestion at this time. -She has dentures above.  No problems chewing. Abdomen: No abdominal pain.  She is moving her bowels well.  No blood in the stools. GU: Reports that she passes her urine without difficulty.  No urinary incontinence. MSK: She is not complaining of any joint pains at this time.       Objective:    Today's Vitals   03/02/21 1524  BP: 120/74  Pulse: 78  Resp: 16  SpO2: 100%  Weight: 146 lb 12.8 oz (66.6 kg)  Height: '5\' 2"'  (1.575 m)  Body mass index is 26.85 kg/m.  Medications Outpatient Encounter Medications as of 03/02/2021  Medication Sig  . acetaminophen (TYLENOL) 500 MG tablet Take 1 tablet (500 mg total) by mouth every 6 (six) hours as needed for mild pain or moderate pain.  Marland Kitchen atorvastatin (LIPITOR) 40 MG tablet Take 1 tablet (40 mg total) by mouth daily.  . Blood Glucose Monitoring Suppl (ONETOUCH VERIO) w/Device KIT Use to check blood sugar daily.  . Cholecalciferol (VITAMIN D3) 2000 units TABS Take 2,000 Units by mouth daily.  . empagliflozin (JARDIANCE) 10 MG TABS tablet Take 10 mg by mouth daily before breakfast.  . gabapentin (NEURONTIN) 300 MG capsule TAKE 1 CAPSULE(300 MG) BY MOUTH AT BEDTIME  . glimepiride (AMARYL) 4 MG tablet TAKE 2 TABLETS(8 MG) BY MOUTH DAILY BEFORE BREAKFAST  . glucose blood (ONETOUCH VERIO) test strip Use as instructed to check blood sugar daily.  . hydrocortisone valerate cream (WESTCORT) 0.2 % Apply 1 application topically 2 (two) times daily.  (Patient not taking: Reported on 03/02/2021)  . insulin glargine (LANTUS SOLOSTAR) 100 UNIT/ML Solostar Pen Inject 33 Units into the skin at bedtime. Must have office visit for refills  . Insulin Pen Needle (BD ULTRA-FINE PEN NEEDLES) 29G X 12.7MM MISC 1 each by Does not apply route 3 (three) times daily.  . Insulin Syringe-Needle U-100 (B-D INS SYRINGE 0.5CC/31GX5/16) 31G X 5/16" 0.5 ML MISC 1 each by Does not apply route at bedtime.  Marland Kitchen JANUVIA 100 MG tablet TAKE 1 TABLET(100 MG) BY MOUTH DAILY  . Lancet Devices (ACCU-CHEK SOFTCLIX) lancets Use as instructed  . meloxicam (MOBIC) 15 MG tablet TAKE 1 TABLET BY MOUTH DAILY. (Patient not taking: Reported on 03/02/2021)  . methocarbamol (ROBAXIN) 500 MG tablet Take 1 tablet (500 mg total) by mouth every 8 (eight) hours as needed for muscle spasms. (Patient not taking: Reported on 03/02/2021)  . Multiple Vitamins-Minerals (MULTIVITAMIN WITH MINERALS) tablet Take 1 tablet by mouth every other day.   Bailey [name] Lancets 46E MISC Use to check blood sugar daily.  Marland Kitchen Bailey [name] 0.025 MG/24HR PLACE 1 PATCH ONTO THE SKIN 2 TIMES A WEEK.  . [DISCONTINUED] nicotine (NICODERM CQ - DOSED IN MG/24 HOURS) 21 mg/24hr patch Place 1 patch (21 mg total) onto the skin daily.   Facility-Administered Encounter Medications as of 03/02/2021  Medication  . 0.9 %  sodium  chloride infusion     History: Past Medical History:  Diagnosis Date  . Arthritis    RIGHT shoulder, knees  . Cervical radiculopathy at C5   . Dupuytren's contracture of both hands    L > R  . Dyslipidemia   . History of adenomatous polyp of colon    1997 and 2003    . History of chronic pelvic inflammatory disease   . History of gastric ulcer    2008  . Hypertriglyceridemia   . Right carpal tunnel syndrome   . Type 2 diabetes mellitus, uncontrolled ([place])    last A1c 8.9 on 07-07-2016  . Vitamin D deficiency    Past Surgical History:  Procedure Laterality Date  . COLONOSCOPY  last one  07-15-2016  . ESOPHAGOGASTRODUODENOSCOPY  last one 2008  . FASCIECTOMY Left 08/24/2016   Procedure: LEFT HAND PALMER AND SMALL FINGER DIGiTAL FASCIECTOMY AND RELEASE;  Surgeon: Bailey Planas, MD;  Location: [facility];  Service: Orthopedics;  Laterality: Left;  . LAPAROSCOPIC CHOLECYSTECTOMY  07/17/2000  . ROTATOR CUFF REPAIR Right 2000  . TOTAL ABDOMINAL HYSTERECTOMY W/ BILATERAL SALPINGOOPHORECTOMY  1998    Family History  Problem Relation Age of Onset  . Diabetes Mother   . Hypertension Mother   . Dementia Mother   . Pancreatitis Mother   . Asthma Mother   . COPD Mother   . Stroke Mother   . Hyperlipidemia Mother   . Heart disease Mother   . Hypertension Father   . Hypertension Sister   . Diabetes Sister   . Hyperlipidemia Sister   . Hypertension Brother   . Diabetes Brother   . Drug abuse Brother   . Cancer Brother        colon  . Hyperlipidemia Brother   . Colon cancer Brother 86  . Hyperlipidemia Brother   . Hypertension Brother   . Hyperlipidemia Sister   . Hypertension Sister   . Diabetes Sister   . Diabetes Maternal Grandmother   . Colon cancer Maternal Grandmother   . Breast cancer Neg Hx   . Esophageal cancer Neg Hx   . Stomach cancer Neg Hx    Social History   Occupational History  . Occupation: Dispensing optician: Bailey [name]     Comment: Civil engineer, contracting  Tobacco Use  . Smoking status: Current Every Day Smoker    Packs/day: 0.25    Years: 28.00    Pack years: 7.00    Types: Cigarettes  . Smokeless tobacco: Never Used  Vaping Use  . Vaping Use: Never used  Substance and Sexual Activity  . Alcohol use: No    Alcohol/week: 0.0 standard drinks  . Drug use: No  . Sexual activity: Yes    Partners: Male    Birth control/protection: Surgical    Tobacco Counseling -smoking 1/2 pk a day.  She has smoked for over 30 years.  Wants to quit.  Was doing good before COVID.  Had started the patches through [employer]. Not  mental ready to give a trial of quitting at this time.  Immunizations and Health Maintenance Immunization History  Administered Date(s) Administered  . Influenza, Seasonal, Injecte, Preservative Fre 01/04/2013  . Influenza,inj,Quad PF,6+ Mos 12/05/2016, 09/17/2019  . Influenza-Unspecified 09/14/2017  . PFIZER(Purple Top)SARS-COV-2 Vaccination 03/26/2020, 04/20/2020, 12/15/2020  . PPD Test 04/10/2018  . Pneumococcal Conjugate-13 12/13/2019  . Pneumococcal Polysaccharide-23 12/05/2016  . Tdap 02/19/2018  She is eligible and due for shingles vaccine.  Patient wants to hold  off on that for now.  She will consider on subsequent visit.  Health Maintenance Due  Topic Date Due  . FOOT EXAM  04/17/2018  . HEMOGLOBIN A1C  06/12/2020  . INFLUENZA VACCINE  07/26/2020  . URINE MICROALBUMIN  09/16/2020  . OPHTHALMOLOGY EXAM  10/07/2020    Activities of Daily Living In your present state of health, do you have any difficulty performing the following activities: 03/02/2021  Hearing? N  Vision? N  Difficulty concentrating or making decisions? N  Walking or climbing stairs? N  Dressing or bathing? N  Doing errands, shopping? N  Preparing Food and eating ? N  Using the Toilet? N  In the past six months, have you accidently leaked urine? N  Do you have problems with loss of bowel control? N  Managing your Medications? N  Managing your Finances? N  Housekeeping or managing your Housekeeping? N  Some recent data might be hidden    Physical Exam   Constitutional: Well-appearing older African-American female in NAD.  Clothing clean.  She answers questions appropriately.. Head: Normocephalic. Atraumatic Ears: Both ear canal and membranes are within normal limits.     Eyes: Conjunctivae and EOM are normal. PERRLA, no scleral icterus.  Mouth: no oral lesions, good oral hygiene, throat clear without exudates.  She has dentures above. Neck: Neck supple.  No tracheal deviation. No thyromegaly. No  cervical LN CVS: RRR, S1/S2 +, no murmurs, no gallops, No JVD Pulmonary: Effort and breath sounds normal, no stridor, rhonchi, wheezes, rales.  Abdominal: Soft. BS +,  no distension, tenderness, rebound or guarding. No organomegaly or masses Musculoskeletal: Normal range of motion. No edema and no tenderness.  Neuro: Alert and oriented x3.  Cns grossly intact, Power: 5/5 BL in all 4s.  Normal  muscle tone, coordination. . Skin: Skin is warm and dry. No rash noted.  Psychiatric: Normal mood and affect. Behavior, judgment, thought content normal.  Diabetic Foot Exam - Simple   Simple Foot Form Visual Inspection No deformities, no ulcerations, no other skin breakdown bilaterally: Yes Sensation Testing Intact to touch and monofilament testing bilaterally: Yes Pulse Check Posterior Tibialis and Dorsalis pulse intact bilaterally: Yes Comments      Advanced Directives: Does Patient Have a Medical Advance Directive?: No Would patient like information on creating a medical advance directive?: Yes (Inpatient - patient defers creating a medical advance directive at this time - Information given)    Assessment:    This is a routine wellness examination for this patient .   Vision/Hearing screen Whisper test normal bilaterally.  Hearing Screening   '125Hz'  '250Hz'  '500Hz'  '1000Hz'  '2000Hz'  '3000Hz'  '4000Hz'  '6000Hz'  '8000Hz'   Right ear:           Left ear:             Visual Acuity Screening   Right eye Left eye Both eyes  Without correction: '20 30 20 ' 40 20 30  With correction:       Dietary issues and exercise activities discussed:     Goals    . HEMOGLOBIN A1C < 7.0    . Quit smoking / using tobacco    . Start moving more     [facility] and plans to start going early mornings starting April 1 to exercise.      Depression Screen PHQ 2/9 Scores 03/02/2021 12/13/2019 07/06/2018 04/09/2018  PHQ - 2 Score 0 0 2 0  PHQ- 9 Score - - 5 -  Fall Risk Fall Risk  03/02/2021   Falls in the past year? 0  Number falls in past yr: 0  Injury with Fall? 0  Risk for fall due to : -  Follow up -    Cognitive Function: MMSE - Mini Mental State Exam 03/02/2021  Orientation to time 5  Orientation to Place 5  Registration 3  Attention/ Calculation 5  Recall 3  Language- name 2 objects 2  Language- repeat 1  Language- follow 3 step command 3  Language- read & follow direction 1  Write a sentence 1  Copy design 1  Total score 30        Patient Care Team: Ladell Pier, MD as PCP - General (Internal Medicine) Bailey [name] -ophthalmologist Dr. Zola Button -hematology/oncology  Dr. Harl Bowie: gastroenterology  Plan:  1. Encounter for Medicare annual wellness exam Patient eligible and due for Shingrix.  However she wants to hold off on that for now. Patient eligible and due for DEXA scan for osteoporosis screening.  She requested to hold off on that until later in the year. She reports having had the flu vaccine in October of last year at [facility].  2. Advance directive discussed with patient I spent 5 minutes discussing advanced directive with her and the components of an advanced directive including living will and healthcare power of attorney.  Patient was very interested and welcomed the material that I gave her.  She states that she will discuss it with her niece.  I requested that if she does execute a living will or healthcare power of attorney that she brings a copy for the medical record.  3. Tobacco dependence due to cigarettes Advised to quit.  Discussed health risks associated with ongoing tobacco use including lung disease, various cancers and increased cardiovascular risks.  She has smoked for over 30 years.  She feels she is not ready mentally to give a trial of quitting at this time.  She is open to [place] addressing this again at a future visit this year.  3 minutes spent on counseling.  4. Far-sighted, bilateral - Ambulatory  referral to Ophthalmology   I have personally reviewed and noted the following in the patient's chart:   . Medical and social history . Use of alcohol, tobacco or illicit drugs  . Current medications and supplements . Functional ability and status . Nutritional status . Physical activity . Advanced directives . List of other physicians . Hospitalizations, surgeries, and ER visits in previous 12 months . Vitals . Screenings to include cognitive, depression, and falls . Referrals and appointments  In addition, I have reviewed and discussed with patient certain preventive protocols, quality metrics, and best practice recommendations. A written personalized care plan for preventive services as well as general preventive health recommendations were provided to patient.     Karle Plumber, MD 03/02/2021

## 2021-03-02 NOTE — Patient Instructions (Signed)
You are eligible to receive the shingles vaccine.  We can give this on your next visit since you are not prepared to have it today.  You are due for bone density study to screen for osteoporosis.  We can order this on your subsequent visit.  I have referred you for your eye exam.  Please read over the material I have given to you explaining the advanced directive.  If you do execute a living will or healthcare power of attorney, please bring a copy of the document for our records.

## 2021-03-15 ENCOUNTER — Other Ambulatory Visit: Payer: Self-pay | Admitting: Internal Medicine

## 2021-03-15 DIAGNOSIS — R232 Flushing: Secondary | ICD-10-CM

## 2021-03-15 NOTE — Telephone Encounter (Signed)
R  Notes to clinic:  Patient has upcoming appt on 04/12/2021 Review for short supply until that time    Requested Prescriptions  Pending Prescriptions Disp Refills   gabapentin (NEURONTIN) 300 MG capsule [Pharmacy Med Name: GABAPENTIN 300MG  CAPSULES] 30 capsule 0    Sig: TAKE 1 CAPSULE(300 MG) BY MOUTH AT BEDTIME      Neurology: Anticonvulsants - gabapentin Failed - 03/15/2021  3:23 AM      Failed - Valid encounter within last 12 months    Recent Outpatient Visits           1 week ago Encounter for [employer] annual wellness exam   [place] Karle Plumber B, MD   1 year ago Need for vaccination against Streptococcus pneumoniae using pneumococcal conjugate vaccine [place], Annie Main L, RPH-CPP   1 year ago Type 2 diabetes mellitus without complication, with long-term current use of insulin ([place])   [place] Karle Plumber B, MD   1 year ago Uncontrolled type 2 diabetes mellitus with hypoglycemia without coma [facility])   [place], Charlane Ferretti, MD   1 year ago Uncontrolled type 2 diabetes mellitus without complication, with long-term current use of insulin [facility])   [place], Dionne [place], [place]       Future Appointments             In 4 weeks Ladell Pier, MD [place]

## 2021-03-18 ENCOUNTER — Other Ambulatory Visit: Payer: Self-pay | Admitting: Internal Medicine

## 2021-03-18 DIAGNOSIS — Z794 Long term (current) use of insulin: Secondary | ICD-10-CM

## 2021-03-18 DIAGNOSIS — E119 Type 2 diabetes mellitus without complications: Secondary | ICD-10-CM

## 2021-03-18 NOTE — Telephone Encounter (Signed)
Requested medications are due for refill today.  yes  Requested medications are on the active medications list.  yes  Last refill. 12/13/2019  Future visit scheduled.   yes  Notes to clinic.  More than 3 months overdue.

## 2021-03-18 NOTE — Telephone Encounter (Signed)
This is not a patient of Financial risk analyst.

## 2021-03-22 NOTE — Telephone Encounter (Signed)
Requested medications are due for refill today.  yes  Requested medications are on the active medications list.  yes  Last refill. 12/13/2019  Future visit scheduled.   yes  Notes to clinic.  More than 3 months overdue

## 2021-04-06 ENCOUNTER — Other Ambulatory Visit: Payer: Self-pay | Admitting: Internal Medicine

## 2021-04-06 ENCOUNTER — Ambulatory Visit: Payer: Medicare Other | Admitting: Internal Medicine

## 2021-04-06 DIAGNOSIS — E11649 Type 2 diabetes mellitus with hypoglycemia without coma: Secondary | ICD-10-CM

## 2021-04-12 ENCOUNTER — Encounter: Payer: Self-pay | Admitting: Internal Medicine

## 2021-04-12 ENCOUNTER — Other Ambulatory Visit: Payer: Self-pay

## 2021-04-12 ENCOUNTER — Ambulatory Visit: Payer: Medicare Other | Attending: Internal Medicine | Admitting: Internal Medicine

## 2021-04-12 VITALS — BP 126/74 | HR 64 | Resp 16 | Wt 150.6 lb

## 2021-04-12 DIAGNOSIS — E119 Type 2 diabetes mellitus without complications: Secondary | ICD-10-CM | POA: Diagnosis not present

## 2021-04-12 DIAGNOSIS — F17200 Nicotine dependence, unspecified, uncomplicated: Secondary | ICD-10-CM | POA: Diagnosis not present

## 2021-04-12 DIAGNOSIS — E785 Hyperlipidemia, unspecified: Secondary | ICD-10-CM

## 2021-04-12 DIAGNOSIS — M25511 Pain in right shoulder: Secondary | ICD-10-CM

## 2021-04-12 DIAGNOSIS — E1136 Type 2 diabetes mellitus with diabetic cataract: Secondary | ICD-10-CM | POA: Diagnosis not present

## 2021-04-12 DIAGNOSIS — Z794 Long term (current) use of insulin: Secondary | ICD-10-CM | POA: Diagnosis not present

## 2021-04-12 DIAGNOSIS — D696 Thrombocytopenia, unspecified: Secondary | ICD-10-CM

## 2021-04-12 DIAGNOSIS — M720 Palmar fascial fibromatosis [Dupuytren]: Secondary | ICD-10-CM

## 2021-04-12 DIAGNOSIS — E1169 Type 2 diabetes mellitus with other specified complication: Secondary | ICD-10-CM

## 2021-04-12 DIAGNOSIS — G8929 Other chronic pain: Secondary | ICD-10-CM

## 2021-04-12 LAB — POCT GLYCOSYLATED HEMOGLOBIN (HGB A1C): HbA1c, POC (controlled diabetic range): 7.9 % — AB (ref 0.0–7.0)

## 2021-04-12 LAB — GLUCOSE, POCT (MANUAL RESULT ENTRY): POC Glucose: 125 mg/dl — AB (ref 70–99)

## 2021-04-12 MED ORDER — BD PEN NEEDLE NANO U/F 32G X 4 MM MISC: 6 refills | Status: DC

## 2021-04-12 MED ORDER — LANTUS SOLOSTAR 100 UNIT/ML ~~LOC~~ SOPN
33.0000 [IU] | PEN_INJECTOR | Freq: Every day | SUBCUTANEOUS | 6 refills | Status: DC
Start: 2021-04-12 — End: 2022-01-24

## 2021-04-12 MED ORDER — EMPAGLIFLOZIN 10 MG PO TABS: 10.0000 mg | ORAL_TABLET | Freq: Every day | ORAL | 3 refills | Status: DC

## 2021-04-12 MED ORDER — GABAPENTIN 300 MG PO CAPS: ORAL_CAPSULE | ORAL | 3 refills | Status: DC

## 2021-04-12 MED ORDER — SITAGLIPTIN PHOSPHATE 100 MG PO TABS: ORAL_TABLET | ORAL | 3 refills | Status: DC

## 2021-04-12 MED ORDER — GLIMEPIRIDE 4 MG PO TABS: 8.0000 mg | ORAL_TABLET | Freq: Every day | ORAL | 3 refills | Status: DC

## 2021-04-12 MED ORDER — ATORVASTATIN CALCIUM 40 MG PO TABS: 40.0000 mg | ORAL_TABLET | Freq: Every day | ORAL | 3 refills | Status: DC

## 2021-04-12 NOTE — Progress Notes (Signed)
Patient ID: Bailey [name], female    DOB: [birth year]  MRN: [id number]  CC: chronic ds management  Subjective: Bailey [name] is a 65 y.o. female who presents for chronic ds management Her concerns today include:  Pt with hx of DM, HL, OA (RT shoulder and knees), gastric ulcer, Vit D def, tob dep, thrombocytopenia.  On Gabapentin for hot flashes  DIABETES TYPE 2 Last A1C:   Results for orders placed or performed in visit on 04/12/21  POCT glucose (manual entry)  Result Value Ref Range   POC Glucose 125 (A) 70 - 99 mg/dl  POCT glycosylated hemoglobin (Hb A1C)  Result Value Ref Range   Hemoglobin A1C     HbA1c POC (<> result, manual entry)     HbA1c, POC (prediabetic range)     HbA1c, POC (controlled diabetic range) 7.9 (A) 0.0 - 7.0 %    Med Adherence:  '[x]'  Yes -on Lantus 33 units and 3 oral meds: Jardiance, Amaryl and Januvia.  Out of Jardiance for 2.5 wks Medication side effects:  '[]'  Yes    '[x]'  No Home Monitoring?  '[x]'  Yes  -once a day in a.m before BF range 104-160s with highest of 203, lowest 86 for mth of April.  For March range was 94-133 with highest of 155, lowest of 81 Home glucose results range: Diet Adherence: '[x]'  Yes -does well with watching what she eats and portion sizes Exercise: '[x]'  Yes -started walking daily for the past  mth Hypoglycemic episodes?: '[]'  Yes    '[x]'  No Numbness of the feet? '[]'  Yes    '[x]'  No Retinopathy hx? '[]'  Yes    '[]'  No Last eye exam: referred on last visit to Dr. Katy [name]. Pt states she called the ophthalmologist and was told she will need to pay $300 before she can be seen.  Saw them 1 yr ago and they billed [employer] instead of her insurance which was [facility].  She has been working with them and has called her insurance trying to get it straightened out.  In the meantime she would like to be referred to a different ophthalmologist.  When she saw Dr. Schuyler [name] a year ago, she reports being told that she has mild cataracts in both eyes.  HL:  Taking  and tolerating Lipitor  THrombocytenia:  No bruising or bleeding.  She was seen by hematology/onc Dr. Alen [name] last year for this.  He think this likely represented benign fluctuating thrombocytopenia without sign of blood disorder.  Tob dep: gradually cutting back but not ready to quit completely  Wants to see orthopedics next mth for Dupytren contractures RT fifth finger .   Had rotator cuff surgery on RT shoulder in 2000. Has aching in shoulder with movement x3-4 mths.  Radiates to elbow.  Takes Tylenol Q 2 days BID on those days Patient Active Problem List   Diagnosis Date Noted  . Thrombocytopenia ([place]) 04/12/2021  . Tobacco dependence 10/05/2017  . Spinal stenosis of lumbar region with neurogenic claudication 10/05/2017  . Osteoarthritis of spine with radiculopathy, lumbar region 10/05/2017  . Plantar wart of right foot 06/01/2017  . Right sided weakness 12/24/2015  . Dyslipidemia with low high density lipoprotein (HDL) cholesterol with hypertriglyceridemia due to type 2 diabetes mellitus ([place]) 11/16/2015  . Uncontrolled type 2 diabetes mellitus with hyperglycemia ([place]) 11/12/2015  . Menopausal symptoms 11/12/2015  . Dupuytren's contracture of both hands 07/05/2013  . Need for Tdap vaccination 07/05/2013  . Arthritis      Current  Outpatient Medications on File Prior to Visit  Medication Sig Dispense Refill  . acetaminophen (TYLENOL) 500 MG tablet Take 1 tablet (500 mg total) by mouth every 6 (six) hours as needed for mild pain or moderate pain. 30 tablet 0  . Blood Glucose Monitoring Suppl (ONETOUCH VERIO) w/Device KIT Use to check blood sugar daily. 1 kit 0  . Cholecalciferol (VITAMIN D3) 2000 units TABS Take 2,000 Units by mouth daily. 30 tablet 11  . glucose blood (ONETOUCH VERIO) test strip Use as instructed to check blood sugar daily. 100 each 11  . Lancet Devices (ACCU-CHEK SOFTCLIX) [name] Use as instructed 1 each 0  . meloxicam (MOBIC) 15 MG tablet TAKE 1 TABLET BY MOUTH  DAILY. (Patient not taking: Reported on 03/02/2021) 30 tablet 0  . Multiple Vitamins-Minerals (MULTIVITAMIN WITH MINERALS) tablet Take 1 tablet by mouth every other day.     Bailey [name] 78G MISC Use to check blood sugar daily. 100 each 11  . VIVELLE-DOT 0.025 MG/24HR PLACE 1 PATCH ONTO THE SKIN 2 TIMES A WEEK. 8 patch 12   Current Facility-Administered Medications on File Prior to Visit  Medication Dose Route Frequency Provider Last Rate Last Admin  . 0.9 %  sodium chloride infusion  500 mL Intravenous Once Bailey [name], Bailey Minks, MD        Allergies  Allergen Reactions  . Metformin And Related Diarrhea  . Shellfish Allergy Anaphylaxis, Hives and Swelling    Lobster, crab (shrimp's ok)  . Ivp Dye [Iodinated Diagnostic Agents] Hives  . Adhesive [Tape] Other (See Comments)    blisters  . Chantix [Varenicline] Other (See Comments)    depression  . Codeine Other (See Comments)    Unknown childhood allergy  . Hydrocodone-Acetaminophen   . Latex Other (See Comments)    blisters  . Penicillins Other (See Comments)    Unknown childhood reaction    Social History   Socioeconomic History  . Marital status: Married    Spouse name: Bailey [name]  . Number of children: 0  . Years of education: 34  . Highest education level: Not on file  Occupational History  . Occupation: Dispensing optician: Bailey [name]     Comment: Civil engineer, contracting  Tobacco Use  . Smoking status: Current Every Day Smoker    Packs/day: 0.25    Years: 28.00    Pack years: 7.00    Types: Cigarettes  . Smokeless tobacco: Never Used  Vaping Use  . Vaping Use: Never used  Substance and Sexual Activity  . Alcohol use: No    Alcohol/week: 0.0 standard drinks  . Drug use: No  . Sexual activity: Yes    Partners: Male    Birth control/protection: Surgical  Other Topics Concern  . Not on file  Social History Narrative   Currently living alone, separated from husband.  One story home.  Bailey [name]'s  children are adults-2 live in [place], 2 live in [place].     Works for a [employer].     Education: 2 years of college.   Social Determinants of Health   Financial Resource Strain: Not on file  Food Insecurity: Not on file  Transportation Needs: Not on file  Physical Activity: Not on file  Stress: Not on file  Social Connections: Not on file  Intimate Partner Violence: Not on file    Family History  Problem Relation Age of Onset  . Diabetes Mother   . Hypertension Mother   . Dementia Mother   .  Pancreatitis Mother   . Asthma Mother   . COPD Mother   . Stroke Mother   . Hyperlipidemia Mother   . Heart disease Mother   . Hypertension Father   . Hypertension Sister   . Diabetes Sister   . Hyperlipidemia Sister   . Hypertension Brother   . Diabetes Brother   . Drug abuse Brother   . Cancer Brother        colon  . Hyperlipidemia Brother   . Colon cancer Brother 71  . Hyperlipidemia Brother   . Hypertension Brother   . Hyperlipidemia Sister   . Hypertension Sister   . Diabetes Sister   . Diabetes Maternal Grandmother   . Colon cancer Maternal Grandmother   . Breast cancer Neg Hx   . Esophageal cancer Neg Hx   . Stomach cancer Neg Hx     Past Surgical History:  Procedure Laterality Date  . COLONOSCOPY  last one 07-15-2016  . ESOPHAGOGASTRODUODENOSCOPY  last one 2008  . FASCIECTOMY Left 08/24/2016   Procedure: LEFT HAND PALMER AND SMALL FINGER DIGiTAL FASCIECTOMY AND RELEASE;  Surgeon: Iran Planas, MD;  Location: [facility];  Service: Orthopedics;  Laterality: Left;  . LAPAROSCOPIC CHOLECYSTECTOMY  07/17/2000  . ROTATOR CUFF REPAIR Right 2000  . TOTAL ABDOMINAL HYSTERECTOMY W/ BILATERAL SALPINGOOPHORECTOMY  1998    ROS: Review of Systems Negative except as stated above  PHYSICAL EXAM: BP 126/74   Pulse 64   Resp 16   Wt 150 lb 9.6 oz (68.3 kg)   SpO2 100%   BMI 27.55 kg/m   Physical Exam  General appearance - alert, well  appearing, and in no distress Mental status - normal mood, behavior, speech, dress, motor activity, and thought processes Neck - supple, no significant adenopathy Chest - clear to auscultation, no wheezes, rales or rhonchi, symmetric air entry Heart - normal rate, regular rhythm, normal S1, S2, no murmurs, rubs, clicks or gallops Musculoskeletal -right shoulder: No point tenderness.  No crepitus on passive range of motion.  Mild discomfort on passive range of motion in all directions.  Drop arm test negative.   Extremities - peripheral pulses normal, no pedal edema, no clubbing or cyanosis Flexion contracture of the right fifth PIP joint. Diabetic Foot Exam - Simple   Simple Foot Form Visual Inspection See comments: Yes Sensation Testing Intact to touch and monofilament testing bilaterally: Yes Pulse Check Posterior Tibialis and Dorsalis pulse intact bilaterally: Yes Comments No toenail on the right big toe.  Looks like it has been removed.      CMP Latest Ref Rng & Units 08/21/2019 07/06/2018 07/04/2017  Glucose 65 - 99 mg/dL 365(H) 143(H) 202(H)  BUN 8 - 27 mg/dL '11 9 10  ' Creatinine 0.57 - 1.00 mg/dL 0.65 0.79 0.79  Sodium 134 - 144 mmol/L 138 145(H) 141  Potassium 3.5 - 5.2 mmol/L 4.3 3.8 4.2  Chloride 96 - 106 mmol/L 100 106 103  CO2 20 - 29 mmol/L '21 25 23  ' Calcium 8.7 - 10.3 mg/dL 9.2 9.3 9.3  Total Protein 6.0 - 8.5 g/dL 6.8 6.9 7.1  Total Bilirubin 0.0 - 1.2 mg/dL 0.2 0.3 0.4  Alkaline Phos 39 - 117 IU/L 103 86 83  AST 0 - 40 IU/L '18 28 24  ' ALT 0 - 32 IU/L 20 41(H) 32   Lipid Panel     Component Value Date/Time   CHOL 95 (L) 12/13/2019 1020   TRIG 71 12/13/2019 1020   HDL 40 12/13/2019  1020   CHOLHDL 2.4 12/13/2019 1020   CHOLHDL 7.0 (H) 11/12/2015 1153   VLDL NOT CALC 11/12/2015 1153   LDLCALC 40 12/13/2019 1020   LDLDIRECT 45 06/10/2016 1213    CBC    Component Value Date/Time   WBC 6.6 07/06/2018 0930   WBC 7.6 08/22/2015 1009   RBC 4.45 07/06/2018 0930    RBC 4.66 08/22/2015 1009   HGB 14.1 07/06/2018 0930   HCT 40.3 07/06/2018 0930   PLT 117 (L) 07/06/2018 0930   MCV 91 07/06/2018 0930   MCH 31.7 07/06/2018 0930   MCH 31.1 08/22/2015 1009   MCHC 35.0 07/06/2018 0930   MCHC 33.6 08/22/2015 1009   RDW 14.2 07/06/2018 0930   LYMPHSABS 2.4 08/22/2015 1009   MONOABS 0.7 08/22/2015 1009   EOSABS 0.1 08/22/2015 1009   BASOSABS 0.0 08/22/2015 1009    ASSESSMENT AND PLAN: 1. Type 2 diabetes mellitus with cataract ([place]) Not at goal.  However she has been out of Jardiance for about 2-1/2 weeks.  She will continue current oral medications and Lantus.  Refill sent to her pharmacy. Discussed on encourage healthy eating habits. Continue regular exercise - POCT glucose (manual entry) - POCT glycosylated hemoglobin (Hb A1C) - Microalbumin / creatinine urine ratio - CBC - Comprehensive metabolic panel - Lipid panel - empagliflozin (JARDIANCE) 10 MG TABS tablet; Take 1 tablet (10 mg total) by mouth daily.  Dispense: 90 tablet; Refill: 3 - sitaGLIPtin (JANUVIA) 100 MG tablet; TAKE 1 TABLET(100 MG) BY MOUTH DAILY  Dispense: 90 tablet; Refill: 3 - glimepiride (AMARYL) 4 MG tablet; Take 2 tablets (8 mg total) by mouth daily with breakfast.  Dispense: 180 tablet; Refill: 3 - Insulin Pen Needle (BD PEN NEEDLE NANO U/F) 32G X 4 MM MISC; Use once a day with insulin pen  Dispense: 100 each; Refill: 6 - insulin glargine (LANTUS SOLOSTAR) 100 UNIT/ML Solostar Pen; Inject 33 Units into the skin at bedtime.  Dispense: 15 mL; Refill: 6 - Ambulatory referral to Ophthalmology  2. Hyperlipidemia associated with type 2 diabetes mellitus (HCC) - atorvastatin (LIPITOR) 40 MG tablet; Take 1 tablet (40 mg total) by mouth daily.  Dispense: 90 tablet; Refill: 3  3. Tobacco dependence Advised to quit.  Discussed health risks associated with smoking.  Patient not ready to give a trial of quitting.  4. Chronic right shoulder pain Likely early arthritis. Plain x-ray  ordered. Okay to continue to use Tylenol as needed. - Ambulatory referral to Orthopedic Surgery - DG Shoulder Right; Future  5. Dupuytren's contracture of right hand - Ambulatory referral to Orthopedic Surgery  6. Thrombocytopenia ([place]) Check CBC today to see whether this has stayed stable or improved.     Patient was given the opportunity to ask questions.  Patient verbalized understanding of the plan and was able to repeat key elements of the plan.   Orders Placed This Encounter  Procedures  . DG Shoulder Right  . Microalbumin / creatinine urine ratio  . CBC  . Comprehensive metabolic panel  . Lipid panel  . Ambulatory referral to Ophthalmology  . Ambulatory referral to Orthopedic Surgery  . POCT glucose (manual entry)  . POCT glycosylated hemoglobin (Hb A1C)     Requested Prescriptions   Signed Prescriptions Disp Refills  . empagliflozin (JARDIANCE) 10 MG TABS tablet 90 tablet 3    Sig: Take 1 tablet (10 mg total) by mouth daily.  . sitaGLIPtin (JANUVIA) 100 MG tablet 90 tablet 3    Sig: TAKE 1  TABLET(100 MG) BY MOUTH DAILY  . glimepiride (AMARYL) 4 MG tablet 180 tablet 3    Sig: Take 2 tablets (8 mg total) by mouth daily with breakfast.  . gabapentin (NEURONTIN) 300 MG capsule 90 capsule 3    Sig: TAKE 1 CAPSULE(300 MG) BY MOUTH AT BEDTIME  . Insulin Pen Needle (BD PEN NEEDLE NANO U/F) 32G X 4 MM MISC 100 each 6    Sig: Use once a day with insulin pen  . insulin glargine (LANTUS SOLOSTAR) 100 UNIT/ML Solostar Pen 15 mL 6    Sig: Inject 33 Units into the skin at bedtime.  Marland Kitchen atorvastatin (LIPITOR) 40 MG tablet 90 tablet 3    Sig: Take 1 tablet (40 mg total) by mouth daily.    Return in about 4 months (around 08/12/2021).  Karle Plumber, MD, FACP

## 2021-04-13 LAB — COMPREHENSIVE METABOLIC PANEL
ALT: 29 IU/L (ref 0–32)
AST: 19 IU/L (ref 0–40)
Albumin/Globulin Ratio: 1.8 (ref 1.2–2.2)
Albumin: 4.6 g/dL (ref 3.8–4.8)
Alkaline Phosphatase: 82 IU/L (ref 44–121)
BUN/Creatinine Ratio: 12 (ref 12–28)
BUN: 10 mg/dL (ref 8–27)
Bilirubin Total: 0.3 mg/dL (ref 0.0–1.2)
CO2: 24 mmol/L (ref 20–29)
Calcium: 9.7 mg/dL (ref 8.7–10.3)
Chloride: 106 mmol/L (ref 96–106)
Creatinine, Ser: 0.81 mg/dL (ref 0.57–1.00)
Globulin, Total: 2.5 g/dL (ref 1.5–4.5)
Glucose: 148 mg/dL — ABNORMAL HIGH (ref 65–99)
Potassium: 3.9 mmol/L (ref 3.5–5.2)
Sodium: 145 mmol/L — ABNORMAL HIGH (ref 134–144)
Total Protein: 7.1 g/dL (ref 6.0–8.5)
eGFR: 80 mL/min/{1.73_m2} (ref >59)

## 2021-04-13 LAB — LIPID PANEL
Chol/HDL Ratio: 2.4 ratio (ref 0.0–4.4)
Cholesterol, Total: 120 mg/dL (ref 100–199)
HDL: 51 mg/dL (ref >39)
LDL Chol Calc (NIH): 47 mg/dL (ref 0–99)
Triglycerides: 128 mg/dL (ref 0–149)
VLDL Cholesterol Cal: 22 mg/dL (ref 5–40)

## 2021-04-13 LAB — CBC
Hematocrit: 40.5 % (ref 34.0–46.6)
Hemoglobin: 13.6 g/dL (ref 11.1–15.9)
MCH: 32.2 pg (ref 26.6–33.0)
MCHC: 33.6 g/dL (ref 31.5–35.7)
MCV: 96 fL (ref 79–97)
Platelets: 120 10*3/uL — ABNORMAL LOW (ref 150–450)
RBC: 4.23 x10E6/uL (ref 3.77–5.28)
RDW: 12.9 % (ref 11.7–15.4)
WBC: 8.1 10*3/uL (ref 3.4–10.8)

## 2021-04-13 LAB — MICROALBUMIN / CREATININE URINE RATIO
Creatinine, Urine: 91.9 mg/dL
Microalb/Creat Ratio: <3 mg/g creat (ref 0–29)
Microalbumin, Urine: <3 ug/mL

## 2021-04-14 ENCOUNTER — Other Ambulatory Visit: Payer: Self-pay

## 2021-04-14 ENCOUNTER — Ambulatory Visit (HOSPITAL_COMMUNITY)
Admission: RE | Admit: 2021-04-14 | Discharge: 2021-04-14 | Disposition: A | Discharge status: Home / Self Care | Payer: Medicare Other | Source: Ambulatory Visit | Attending: Internal Medicine | Admitting: Internal Medicine

## 2021-04-14 DIAGNOSIS — M25511 Pain in right shoulder: Secondary | ICD-10-CM | POA: Insufficient documentation

## 2021-04-14 DIAGNOSIS — G8929 Other chronic pain: Secondary | ICD-10-CM

## 2021-04-14 NOTE — Progress Notes (Signed)
Let patient know there is no abnormal protein buildup in the urine.  No anemia but she continues to have slightly low but stable platelet cell counts.  Platelets are the cells that helps the blood clot normally.  We will continue to monitor.  She has seen the hematologist for this already.  Kidney and liver function tests are good.  Cholesterol levels are at goal.

## 2021-04-23 LAB — HM DIABETES EYE EXAM

## 2021-07-31 ENCOUNTER — Other Ambulatory Visit: Payer: Self-pay

## 2021-07-31 ENCOUNTER — Encounter (HOSPITAL_COMMUNITY): Payer: Self-pay

## 2021-07-31 ENCOUNTER — Ambulatory Visit (HOSPITAL_COMMUNITY)
Admission: EM | Admit: 2021-07-31 | Discharge: 2021-07-31 | Disposition: A | Discharge status: Home / Self Care | Payer: Medicare Other

## 2021-07-31 DIAGNOSIS — K130 Diseases of lips: Secondary | ICD-10-CM

## 2021-07-31 NOTE — Discharge Instructions (Addendum)
  Please speak with your primary care provider for referrals to an oral surgeon or possibly a dermatologist to likely remove the cyst in your lip affecting your dental appliances and eating.  Please seek immediate medical evaluation if you develop pain, worsening swelling, fever, redness, or other new symptoms develop.

## 2021-07-31 NOTE — ED Triage Notes (Signed)
Pt present lip swelling on the left side. Pt state that she woke up on Wednesday morning and her lip was swollen.

## 2021-07-31 NOTE — ED Provider Notes (Signed)
[facility]    CSN: [id number] Arrival date & time: 07/31/21  1009      History   Chief Complaint Chief Complaint  Patient presents with   Oral Swelling    HPI Bailey [name] is a 65 y.o. female.   HPI Bailey [name] is a 65 y.o. female presenting to UC with c/o of upper lip swelling on the Left side that she noted 3 days ago.  She reports eating [employer] and [employer] the day before noticing the swelling but has eaten at both places before without a reaction. Hx of shellfish allergy but has not been exposed to seafood that she is aware of. Denies pain. Denies trouble breathing or swallowing.  She takes daily allergy medication but no relief. No rashes.  She does wear partial dentures but has not been wearing them since noticing the swelling. No recent change in dental appliances.    Past Medical History:  Diagnosis Date   Arthritis    RIGHT shoulder, knees   Cervical radiculopathy at C5    Dupuytren's contracture of both hands    L > R   Dyslipidemia    History of adenomatous polyp of colon    1997 and 2003     History of chronic pelvic inflammatory disease    History of gastric ulcer    2008   Hypertriglyceridemia    Right carpal tunnel syndrome    Type 2 diabetes mellitus, uncontrolled ([place])    last A1c 8.9 on 07-07-2016   Vitamin D deficiency     Patient Active Problem List   Diagnosis Date Noted   Thrombocytopenia ([place]) 04/12/2021   Tobacco dependence 10/05/2017   Spinal stenosis of lumbar region with neurogenic claudication 10/05/2017   Osteoarthritis of spine with radiculopathy, lumbar region 10/05/2017   Plantar wart of right foot 06/01/2017   Right sided weakness 12/24/2015   Dyslipidemia with low high density lipoprotein (HDL) cholesterol with hypertriglyceridemia due to type 2 diabetes mellitus ([place]) 11/16/2015   Uncontrolled type 2 diabetes mellitus with hyperglycemia ([place]) 11/12/2015   Menopausal symptoms 11/12/2015    Dupuytren's contracture of both hands 07/05/2013   Need for Tdap vaccination 07/05/2013   Arthritis     Past Surgical History:  Procedure Laterality Date   COLONOSCOPY  last one 07-15-2016   ESOPHAGOGASTRODUODENOSCOPY  last one 2008   FASCIECTOMY Left 08/24/2016   Procedure: LEFT HAND PALMER AND SMALL FINGER DIGiTAL FASCIECTOMY AND RELEASE;  Surgeon: Iran Planas, MD;  Location: [facility];  Service: Orthopedics;  Laterality: Left;   LAPAROSCOPIC CHOLECYSTECTOMY  07/17/2000   ROTATOR CUFF REPAIR Right 2000   TOTAL ABDOMINAL HYSTERECTOMY W/ BILATERAL SALPINGOOPHORECTOMY  1998    OB History   No obstetric history on file.      Home Medications    Prior to Admission medications   Medication Sig Start Date End Date Taking? Authorizing Provider  acetaminophen (TYLENOL) 500 MG tablet Take 1 tablet (500 mg total) by mouth every 6 (six) hours as needed for mild pain or moderate pain. 07/27/17   Waynetta Pean, PA-C  atorvastatin (LIPITOR) 40 MG tablet Take 1 tablet (40 mg total) by mouth daily. 04/12/21   Ladell Pier, MD  Blood Glucose Monitoring Suppl [facility] VERIO) w/Device KIT Use to check blood sugar daily. 09/13/19   Ladell Pier, MD  Cholecalciferol (VITAMIN D3) 2000 units TABS Take 2,000 Units by mouth daily. 10/21/16   Funches, Adriana Mccallum, MD  empagliflozin (JARDIANCE) 10 MG TABS tablet  Take 1 tablet (10 mg total) by mouth daily. 04/12/21   Ladell Pier, MD  gabapentin (NEURONTIN) 300 MG capsule TAKE 1 CAPSULE(300 MG) BY MOUTH AT BEDTIME 04/12/21   Ladell Pier, MD  glimepiride (AMARYL) 4 MG tablet Take 2 tablets (8 mg total) by mouth daily with breakfast. 04/12/21   Ladell Pier, MD  glucose blood (ONETOUCH VERIO) test strip Use as instructed to check blood sugar daily. 09/13/19   Ladell Pier, MD  insulin glargine (LANTUS SOLOSTAR) 100 UNIT/ML Solostar Pen Inject 33 Units into the skin at bedtime. 04/12/21   Ladell Pier, MD   Insulin Pen Needle (BD PEN NEEDLE NANO U/F) 32G X 4 MM MISC Use once a day with insulin pen 04/12/21   Ladell Pier, MD  Lancet Devices (ACCU-CHEK [facility]) lancets Use as instructed 06/15/16   Boykin Nearing, MD  meloxicam (MOBIC) 15 MG tablet TAKE 1 TABLET BY MOUTH DAILY. Patient not taking: Reported on 03/02/2021 03/20/18   Gildardo Pounds, NP  Multiple Vitamins-Minerals (MULTIVITAMIN WITH MINERALS) tablet Take 1 tablet by mouth every other day.     [provider]  OneTouch Delica Lancets 51W MISC Use to check blood sugar daily. 09/13/19   Ladell Pier, MD  sitaGLIPtin (JANUVIA) 100 MG tablet TAKE 1 TABLET(100 MG) BY MOUTH DAILY 04/12/21   Ladell Pier, MD  VIVELLE-DOT 0.025 MG/24HR PLACE 1 PATCH ONTO THE SKIN 2 TIMES A WEEK. 04/06/17   Woodroe Mode, MD    Family History Family History  Problem Relation Age of Onset   Diabetes Mother    Hypertension Mother    Dementia Mother    Pancreatitis Mother    Asthma Mother    COPD Mother    Stroke Mother    Hyperlipidemia Mother    Heart disease Mother    Hypertension Father    Hypertension Sister    Diabetes Sister    Hyperlipidemia Sister    Hypertension Brother    Diabetes Brother    Drug abuse Brother    Cancer Brother        colon   Hyperlipidemia Brother    Colon cancer Brother 91   Hyperlipidemia Brother    Hypertension Brother    Hyperlipidemia Sister    Hypertension Sister    Diabetes Sister    Diabetes Maternal Grandmother    Colon cancer Maternal Grandmother    Breast cancer Neg Hx    Esophageal cancer Neg Hx    Stomach cancer Neg Hx     Social History Social History   Tobacco Use   Smoking status: Every Day    Packs/day: 0.25    Years: 28.00    Pack years: 7.00    Types: Cigarettes   Smokeless tobacco: Never  Vaping Use   Vaping Use: Never used  Substance Use Topics   Alcohol use: No    Alcohol/week: 0.0 standard drinks   Drug use: No     Allergies   Metformin and  related, Shellfish allergy, Ivp dye [iodinated diagnostic agents], Adhesive [tape], Chantix [varenicline], Codeine, Hydrocodone-acetaminophen, Latex, and Penicillins   Review of Systems Review of Systems  Constitutional:  Negative for chills and fever.  HENT:  Negative for trouble swallowing and voice change.   Skin:  Negative for color change and rash.    Physical Exam Triage Vital Signs ED Triage Vitals  Enc Vitals Group     BP 07/31/21 1102 112/62     Pulse Rate  07/31/21 1102 82     Resp 07/31/21 1102 16     Temp 07/31/21 1102 98.8 F (37.1 C)     Temp Source 07/31/21 1102 Oral     SpO2 07/31/21 1102 100 %     Weight --      Height --      Head Circumference --      Peak Flow --      Pain Score 07/31/21 1103 0     Pain Loc --      Pain Edu? --      Excl. in [place]? --    No data found.  Updated Vital Signs BP 112/62 (BP Location: Left Arm)   Pulse 82   Temp 98.8 F (37.1 C) (Oral)   Resp 16   SpO2 100%   Visual Acuity Right Eye Distance:   Left Eye Distance:   Bilateral Distance:    Right Eye Near:   Left Eye Near:    Bilateral Near:     Physical Exam Vitals and nursing note reviewed.  Constitutional:      General: She is not in acute distress.    Appearance: Normal appearance. She is well-developed. She is not ill-appearing, toxic-appearing or diaphoretic.  HENT:     Head: Normocephalic and atraumatic.     Right Ear: External ear normal.     Left Ear: External ear normal.     Nose: Nose normal.     Right Sinus: No maxillary sinus tenderness or frontal sinus tenderness.     Left Sinus: No maxillary sinus tenderness or frontal sinus tenderness.     Mouth/Throat:     Lips: Pink.     Mouth: Mucous membranes are moist.     Pharynx: Oropharynx is clear. Uvula midline.   Cardiovascular:     Rate and Rhythm: Normal rate.  Pulmonary:     Effort: Pulmonary effort is normal.  Musculoskeletal:        General: Normal range of motion.     Cervical back:  Normal range of motion.  Skin:    General: Skin is warm and dry.     Findings: No rash.  Neurological:     Mental Status: She is alert and oriented to person, place, and time.  Psychiatric:        Behavior: Behavior normal.     UC Treatments / Results  Labs (all labs ordered are listed, but only abnormal results are displayed) Labs Reviewed - No data to display  EKG   Radiology No results found.  Procedures Procedures (including critical care time)  Medications Ordered in UC Medications - No data to display  Initial Impression / Assessment and Plan / UC Course  I have reviewed the triage vital signs and the nursing notes.  Pertinent labs & imaging results that were available during my care of the patient were reviewed by me and considered in my medical decision making (see chart for details).     Exam most c/w cyst. No evidence of infection at this time. No evidence of anaphylaxis.  Exam not c/w allergic reaction. Encouraged f/u with PCP and Dentist or Oral Surgeon to discuss treatment, likely removal.  Signs/Symptoms that warrant urgent/emergent care discussed. Pt verbalized understanding and agreement with plan.  AVS given  Final Clinical Impressions(s) / UC Diagnoses   Final diagnoses:  Cyst of lip     Discharge Instructions       Please speak with your primary care provider for referrals to  an oral surgeon or possibly a dermatologist to likely remove the cyst in your lip affecting your dental appliances and eating.  Please seek immediate medical evaluation if you develop pain, worsening swelling, fever, redness, or other new symptoms develop.      ED Prescriptions   None    PDMP not reviewed this encounter.   Noe Gens, [place] 07/31/21 1550

## 2021-08-01 ENCOUNTER — Other Ambulatory Visit: Payer: Self-pay

## 2021-08-01 ENCOUNTER — Encounter (HOSPITAL_COMMUNITY): Payer: Self-pay | Admitting: Emergency Medicine

## 2021-08-01 ENCOUNTER — Emergency Department (HOSPITAL_COMMUNITY)
Admission: EM | Admit: 2021-08-01 | Discharge: 2021-08-01 | Disposition: A | Discharge status: Home / Self Care | Payer: Medicare Other | Attending: Emergency Medicine | Admitting: Emergency Medicine

## 2021-08-01 DIAGNOSIS — R220 Localized swelling, mass and lump, head: Secondary | ICD-10-CM

## 2021-08-01 DIAGNOSIS — Z91040 Latex allergy status: Secondary | ICD-10-CM | POA: Insufficient documentation

## 2021-08-01 DIAGNOSIS — Z794 Long term (current) use of insulin: Secondary | ICD-10-CM | POA: Insufficient documentation

## 2021-08-01 DIAGNOSIS — Z7984 Long term (current) use of oral hypoglycemic drugs: Secondary | ICD-10-CM | POA: Diagnosis not present

## 2021-08-01 DIAGNOSIS — F17210 Nicotine dependence, cigarettes, uncomplicated: Secondary | ICD-10-CM | POA: Insufficient documentation

## 2021-08-01 DIAGNOSIS — E119 Type 2 diabetes mellitus without complications: Secondary | ICD-10-CM | POA: Insufficient documentation

## 2021-08-01 MED ORDER — DOXYCYCLINE HYCLATE 100 MG PO CAPS: 100.0000 mg | ORAL_CAPSULE | Freq: Two times a day (BID) | ORAL | 0 refills | Status: DC

## 2021-08-01 MED ORDER — LIDOCAINE HCL (PF) 1 % IJ SOLN
5.0000 mL | Freq: Once | INTRAMUSCULAR | Status: AC
  Administered 2021-08-01: 5 mL via INTRADERMAL
  Filled 2021-08-01: qty 5

## 2021-08-01 NOTE — ED Triage Notes (Addendum)
Pt c/o upper lip swelling since Wednesday.  States she puts ice on it during the day and it goes down some.  Worse in the mornings.  Seen at [facility] yesterday.  Denies SOB.

## 2021-08-01 NOTE — Discharge Instructions (Addendum)
Your lip swelling is likely due to a cyst.  Continue to apply cool compress several times daily to help aid with healing.  If you develop increasing pain redness or pus draining, please consider take antibiotic and follow-up with your doctor for further care.

## 2021-08-01 NOTE — ED Provider Notes (Signed)
[facility] EMERGENCY DEPARTMENT Provider Note   CSN: [id number] Arrival date & time: 08/01/21  7741     History No chief complaint on file.   Bailey [name] is a 65 y.o. female.  HPI  65 year old female significant history of diabetes who presenting complaining of lip swelling.  Patient report 5 days ago she noticed swelling to her left upper lip region.  She first noticed it after she went to eat at a [employer].  She mention the swelling has been persistent and seems to be improving when she apply cool compress overnight but the swelling will return.  She denies any significant pain to the site and denies any injury.  No fever chills no skin rash or trouble breathing or throat swelling or tongue swelling.  She has history of allergies shellfish and IV dye in the past but denies any changes that may contribute to symptoms.  She went to urgent care center yesterday for her complaint but was disappointed when they mention it could be a lipoma.  She does not think this is a lipoma and request for further management.  She does wear dentures and states she is afraid to wear her denture due to the swelling and irritation.  Past Medical History:  Diagnosis Date   Arthritis    RIGHT shoulder, knees   Cervical radiculopathy at C5    Dupuytren's contracture of both hands    L > R   Dyslipidemia    History of adenomatous polyp of colon    1997 and 2003     History of chronic pelvic inflammatory disease    History of gastric ulcer    2008   Hypertriglyceridemia    Right carpal tunnel syndrome    Type 2 diabetes mellitus, uncontrolled ([place])    last A1c 8.9 on 07-07-2016   Vitamin D deficiency     Patient Active Problem List   Diagnosis Date Noted   Thrombocytopenia ([place]) 04/12/2021   Tobacco dependence 10/05/2017   Spinal stenosis of lumbar region with neurogenic claudication 10/05/2017   Osteoarthritis of spine with radiculopathy, lumbar region  10/05/2017   Plantar wart of right foot 06/01/2017   Right sided weakness 12/24/2015   Dyslipidemia with low high density lipoprotein (HDL) cholesterol with hypertriglyceridemia due to type 2 diabetes mellitus ([place]) 11/16/2015   Uncontrolled type 2 diabetes mellitus with hyperglycemia ([place]) 11/12/2015   Menopausal symptoms 11/12/2015   Dupuytren's contracture of both hands 07/05/2013   Need for Tdap vaccination 07/05/2013   Arthritis     Past Surgical History:  Procedure Laterality Date   COLONOSCOPY  last one 07-15-2016   ESOPHAGOGASTRODUODENOSCOPY  last one 2008   FASCIECTOMY Left 08/24/2016   Procedure: LEFT HAND PALMER AND SMALL FINGER DIGiTAL FASCIECTOMY AND RELEASE;  Surgeon: Iran Planas, MD;  Location: [place] [facility];  Service: Orthopedics;  Laterality: Left;   LAPAROSCOPIC CHOLECYSTECTOMY  07/17/2000   ROTATOR CUFF REPAIR Right 2000   TOTAL ABDOMINAL HYSTERECTOMY W/ BILATERAL SALPINGOOPHORECTOMY  1998     OB History   No obstetric history on file.     Family History  Problem Relation Age of Onset   Diabetes Mother    Hypertension Mother    Dementia Mother    Pancreatitis Mother    Asthma Mother    COPD Mother    Stroke Mother    Hyperlipidemia Mother    Heart disease Mother    Hypertension Father    Hypertension Sister  Diabetes Sister    Hyperlipidemia Sister    Hypertension Brother    Diabetes Brother    Drug abuse Brother    Cancer Brother        colon   Hyperlipidemia Brother    Colon cancer Brother 64   Hyperlipidemia Brother    Hypertension Brother    Hyperlipidemia Sister    Hypertension Sister    Diabetes Sister    Diabetes Maternal Grandmother    Colon cancer Maternal Grandmother    Breast cancer Neg Hx    Esophageal cancer Neg Hx    Stomach cancer Neg Hx     Social History   Tobacco Use   Smoking status: Every Day    Packs/day: 0.25    Years: 28.00    Pack years: 7.00    Types: Cigarettes   Smokeless tobacco: Never   Vaping Use   Vaping Use: Never used  Substance Use Topics   Alcohol use: No    Alcohol/week: 0.0 standard drinks   Drug use: No    Home Medications Prior to Admission medications   Medication Sig Start Date End Date Taking? Authorizing Provider  acetaminophen (TYLENOL) 500 MG tablet Take 1 tablet (500 mg total) by mouth every 6 (six) hours as needed for mild pain or moderate pain. 07/27/17   Waynetta Pean, PA-C  atorvastatin (LIPITOR) 40 MG tablet Take 1 tablet (40 mg total) by mouth daily. 04/12/21   Ladell Pier, MD  Blood Glucose Monitoring Suppl [facility] VERIO) w/Device KIT Use to check blood sugar daily. 09/13/19   Ladell Pier, MD  Cholecalciferol (VITAMIN D3) 2000 units TABS Take 2,000 Units by mouth daily. 10/21/16   Funches, Adriana Mccallum, MD  empagliflozin (JARDIANCE) 10 MG TABS tablet Take 1 tablet (10 mg total) by mouth daily. 04/12/21   Ladell Pier, MD  gabapentin (NEURONTIN) 300 MG capsule TAKE 1 CAPSULE(300 MG) BY MOUTH AT BEDTIME 04/12/21   Ladell Pier, MD  glimepiride (AMARYL) 4 MG tablet Take 2 tablets (8 mg total) by mouth daily with breakfast. 04/12/21   Ladell Pier, MD  glucose blood (ONETOUCH VERIO) test strip Use as instructed to check blood sugar daily. 09/13/19   Ladell Pier, MD  insulin glargine (LANTUS SOLOSTAR) 100 UNIT/ML Solostar Pen Inject 33 Units into the skin at bedtime. 04/12/21   Ladell Pier, MD  Insulin Pen Needle (BD PEN NEEDLE NANO U/F) 32G X 4 MM MISC Use once a day with insulin pen 04/12/21   Ladell Pier, MD  Lancet Devices (ACCU-CHEK [facility]) lancets Use as instructed 06/15/16   Boykin Nearing, MD  meloxicam (MOBIC) 15 MG tablet TAKE 1 TABLET BY MOUTH DAILY. Patient not taking: Reported on 03/02/2021 03/20/18   Gildardo Pounds, NP  Multiple Vitamins-Minerals (MULTIVITAMIN WITH MINERALS) tablet Take 1 tablet by mouth every other day.     [provider]  OneTouch Delica Lancets 76K MISC Use to check  blood sugar daily. 09/13/19   Ladell Pier, MD  sitaGLIPtin (JANUVIA) 100 MG tablet TAKE 1 TABLET(100 MG) BY MOUTH DAILY 04/12/21   Ladell Pier, MD  VIVELLE-DOT 0.025 MG/24HR PLACE 1 PATCH ONTO THE SKIN 2 TIMES A WEEK. 04/06/17   Woodroe Mode, MD    Allergies    Metformin and related, Shellfish allergy, Ivp dye [iodinated diagnostic agents], Adhesive [tape], Chantix [varenicline], Codeine, Hydrocodone-acetaminophen, Latex, and Penicillins  Review of Systems   Review of Systems  All other systems reviewed and are  negative.  Physical Exam Updated Vital Signs BP 127/73 (BP Location: Right Arm)   Pulse (!) 58   Temp 98.1 F (36.7 C) (Oral)   Resp 17   SpO2 100%   Physical Exam Vitals and nursing note reviewed.  Constitutional:      General: She is not in acute distress.    Appearance: She is well-developed.  HENT:     Head: Atraumatic.     Mouth/Throat:     Comments: Lips: Left lip with a palpable cystic nodule approximately 2 cm in diameter nontender to palpation without surrounding skin erythema or warmth.  Is edentulous Eyes:     Conjunctiva/sclera: Conjunctivae normal.  Cardiovascular:     Rate and Rhythm: Normal rate and regular rhythm.  Pulmonary:     Effort: Pulmonary effort is normal.     Breath sounds: No wheezing.  Abdominal:     Palpations: Abdomen is soft.  Musculoskeletal:     Cervical back: Neck supple.  Skin:    Findings: No rash.  Neurological:     Mental Status: She is alert.  Psychiatric:        Mood and Affect: Mood normal.    ED Results / Procedures / Treatments   Labs (all labs ordered are listed, but only abnormal results are displayed) Labs Reviewed - No data to display  EKG None  Radiology No results found.  Procedures .Marland KitchenIncision and Drainage  Date/Time: 08/01/2021 11:35 AM Performed by: Domenic Moras, PA-C Authorized by: Domenic Moras, PA-C   Consent:    Consent obtained:  Verbal   Consent given by:  Patient   Risks  discussed:  Bleeding, incomplete drainage, pain and damage to other organs   Alternatives discussed:  No treatment Universal protocol:    Procedure explained and questions answered to patient or proxy's satisfaction: yes     Relevant documents present and verified: yes     Test results available : yes     Imaging studies available: yes     Required blood products, implants, devices, and special equipment available: yes     Site/side marked: yes     Immediately prior to procedure, a time out was called: yes     Patient identity confirmed:  Verbally with patient Location:    Type:  Cyst   Size:  2   Location:  Mouth   Mouth location: left upper lip. Pre-procedure details:    Skin preparation:  Antiseptic wash Sedation:    Sedation type:  None Anesthesia:    Anesthesia method:  Local infiltration   Local anesthetic:  Lidocaine 1% w/o epi Procedure type:    Complexity:  Simple Procedure details:    Incision types:  Single straight   Incision depth:  Subcutaneous   Wound management:  Probed and deloculated, irrigated with saline and extensive cleaning   Drainage:  Bloody   Drainage amount:  Scant   Packing materials:  None Post-procedure details:    Procedure completion:  Tolerated well, no immediate complications   Medications Ordered in ED Medications  lidocaine (PF) (XYLOCAINE) 1 % injection 5 mL (has no administration in time range)    ED Course  I have reviewed the triage vital signs and the nursing notes.  Pertinent labs & imaging results that were available during my care of the patient were reviewed by me and considered in my medical decision making (see chart for details).    MDM Rules/Calculators/A&P  BP 127/73 (BP Location: Right Arm)   Pulse (!) 58   Temp 98.1 F (36.7 C) (Oral)   Resp 17   SpO2 100%   Final Clinical Impression(s) / ED Diagnoses Final diagnoses:  Swelling of upper lip    Rx / DC Orders ED Discharge Orders      None      11:36 AM Patient had a cyst that appears on her left upper lip for the past few days.  No allergic reaction.  It is minimally tender but palpable.  Initially I recommend continue with cool compresses however patient request for I&D.  I perform I&D but no purulent discharge.  Wound care instruction provided.  We will also prescribe antibiotic to use only in the event that she noticed signs of infection which I did spend time describing to her.  Return precaution given.   Domenic Moras, PA-C 08/01/21 1142    Dorie Rank, MD 08/02/21 [phone]

## 2021-09-01 ENCOUNTER — Ambulatory Visit: Payer: Self-pay | Admitting: *Deleted

## 2021-09-01 NOTE — Telephone Encounter (Signed)
Reason for Disposition  [1] HIGH RISK for severe COVID complications (e.g., weak immune system, age > 63 years, obesity with BMI > 25, pregnant, chronic lung disease or other chronic medical condition) AND [2] COVID symptoms (e.g., cough, fever)  (Exceptions: Already seen by PCP and no new or worsening symptoms.)  Answer Assessment - Initial Assessment Questions 1. COVID-19 DIAGNOSIS: "Who made your COVID-19 diagnosis?" "Was it confirmed by a positive lab test or self-test?" If not diagnosed by a doctor (or NP/PA), ask "Are there lots of cases (community spread) where you live?" Note: See public health department website, if unsure.     Home test today 2. COVID-19 EXPOSURE: "Was there any known exposure to COVID before the symptoms began?" CDC Definition of close contact: within 6 feet (2 meters) for a total of 15 minutes or more over a 24-hour period.      no 3. ONSET: "When did the COVID-19 symptoms start?"      Tuesday 4. WORST SYMPTOM: "What is your worst symptom?" (e.g., cough, fever, shortness of breath, muscle aches)      5. COUGH: "Do you have a cough?" If Yes, ask: "How bad is the cough?"       Dry cough 6. FEVER: "Do you have a fever?" If Yes, ask: "What is your temperature, how was it measured, and when did it start?"     Subjective fever, sweating 7. RESPIRATORY STATUS: "Describe your breathing?" (e.g., shortness of breath, wheezing, unable to speak)      WNL 8. BETTER-SAME-WORSE: "Are you getting better, staying the same or getting worse compared to yesterday?"  If getting worse, ask, "In what way?"     Tired 9. HIGH RISK DISEASE: "Do you have any chronic medical problems?" (e.g., asthma, heart or lung disease, weak immune system, obesity, etc.)      10. VACCINE: "Have you had the COVID-19 vaccine?" If Yes, ask: "Which one, how many shots, when did you get it?"       2, Pfizer 03/26/20 11. BOOSTER: "Have you received your COVID-19 booster?" If Yes, ask: "Which one and when did you  get it?"       2, pfizer 04/20/20  13. OTHER SYMPTOMS: "Do you have any other symptoms?"  (e.g., chills, fatigue, headache, loss of smell or taste, muscle pain, sore throat)       Sore throat, runny nose, dry cough, fatigue 14. O2 SATURATION MONITOR:  "Do you use an oxygen saturation monitor (pulse oximeter) at home?" If Yes, ask "What is your reading (oxygen level) today?" "What is your usual oxygen saturation reading?" (e.g., 95%)       NA  Protocols used: Coronavirus (COVID-19) Diagnosed or Suspected-A-AH

## 2021-09-01 NOTE — Telephone Encounter (Signed)
Pt tested positive covid today, home test. Reports sore throat, dry cough, subjective fever (sweating this AM) increased fatigue.Onset symptoms Tuesday.  No known exposure. Denies SOB, no CP. HAs been vaccinated and boosters x 2. Pt is interested in Paxlovid if Dr. Wynetta Emery thinks appropriate. Home care advise given, reviewed guidelines for self isolation as well as symptoms that warrant an ED eval. Assured pt NT would route to practice for PCPs review and final disposition. Pt verbalizes understanding.  CB# [phone]

## 2021-09-02 ENCOUNTER — Telehealth: Payer: Self-pay | Admitting: Internal Medicine

## 2021-09-02 NOTE — Telephone Encounter (Signed)
Pt is calling to schedule virtual for postive 09/14/21 COVID Dx yesterday. Soonest appt is Please advise CB- [phone]

## 2021-09-02 NOTE — Telephone Encounter (Signed)
Contacted pt to schedule a virtual appt pt didn't answer lvm. Will forward to provider

## 2021-09-03 ENCOUNTER — Other Ambulatory Visit: Payer: Self-pay

## 2021-09-03 ENCOUNTER — Ambulatory Visit: Payer: Medicare Other | Attending: Internal Medicine | Admitting: Internal Medicine

## 2021-09-03 DIAGNOSIS — U071 COVID-19: Secondary | ICD-10-CM

## 2021-09-03 NOTE — Progress Notes (Signed)
Patient ID: Bailey [name], female   DOB: 01-16-1956, 65 y.o.   MRN: [id number] Virtual Visit via Telephone Note  I connected with Raquel James on 09/03/2021 at 8:15 a.m by telephone and verified that I am speaking with the correct person using two identifiers  Location: Patient: home Provider: office  Participants: Myself Patient   I discussed the limitations, risks, security and privacy concerns of performing an evaluation and management service by telephone and the availability of in person appointments. I also discussed with the patient that there may be a patient responsible charge related to this service. The patient expressed understanding and agreed to proceed.   History of Present Illness: Pt with hx of DM, HL, OA (RT shoulder and knees), gastric ulcer, Vit D def, tob dep, thrombocytopenia.  On Gabapentin for hot flashes.  This is an urgent care visit for positive COVID test.  Patient had called 2 days ago to inform me that she tested positive for COVID and wanted to be considered for oral antiviral treatment. -Symptoms started 4 days ago with rhinorrhea, coughing, sneezing and body aches.  She thought it was her allergies at first.  She did a home COVID test on 09/01/2021 and it came back positive.  She denies fever, shortness of breath, congestion or loss of taste or smell.  She took Tylenol a few times since symptoms started.  Today she is feeling better compared to 4 days ago.  She states she is not coughing as much anymore and the runny nose and sneezing have resolved.  She is drinking plenty of fluids.  Quarantine measures were given to her by the RN whom she spoke to 2 days ago. She has received 2 COVID vaccines plus the booster.  Outpatient Encounter Medications as of 09/03/2021  Medication Sig Note   acetaminophen (TYLENOL) 500 MG tablet Take 1 tablet (500 mg total) by mouth every 6 (six) hours as needed for mild pain or moderate pain.    atorvastatin  (LIPITOR) 40 MG tablet Take 1 tablet (40 mg total) by mouth daily.    Blood Glucose Monitoring Suppl (ONETOUCH VERIO) w/Device KIT Use to check blood sugar daily.    Cholecalciferol (VITAMIN D3) 2000 units TABS Take 2,000 Units by mouth daily.    doxycycline (VIBRAMYCIN) 100 MG capsule Take 1 capsule (100 mg total) by mouth 2 (two) times daily.    empagliflozin (JARDIANCE) 10 MG TABS tablet Take 1 tablet (10 mg total) by mouth daily.    gabapentin (NEURONTIN) 300 MG capsule TAKE 1 CAPSULE(300 MG) BY MOUTH AT BEDTIME    glimepiride (AMARYL) 4 MG tablet Take 2 tablets (8 mg total) by mouth daily with breakfast.    glucose blood (ONETOUCH VERIO) test strip Use as instructed to check blood sugar daily.    insulin glargine (LANTUS SOLOSTAR) 100 UNIT/ML Solostar Pen Inject 33 Units into the skin at bedtime.    Insulin Pen Needle (BD PEN NEEDLE NANO U/F) 32G X 4 MM MISC Use once a day with insulin pen    Lancet Devices (ACCU-CHEK [employer]) lancets Use as instructed    meloxicam (MOBIC) 15 MG tablet TAKE 1 TABLET BY MOUTH DAILY. (Patient not taking: Reported on 03/02/2021)    Multiple Vitamins-Minerals (MULTIVITAMIN WITH MINERALS) tablet Take 1 tablet by mouth every other day.     OneTouch Delica Lancets 19B MISC Use to check blood sugar daily.    sitaGLIPtin (JANUVIA) 100 MG tablet TAKE 1 TABLET(100 MG) BY MOUTH DAILY  VIVELLE-DOT 0.025 MG/24HR PLACE 1 PATCH ONTO THE SKIN 2 TIMES A WEEK. 06/08/2018: Pt uses   Facility-Administered Encounter Medications as of 09/03/2021  Medication   0.9 %  sodium chloride infusion      Observations/Objective: No direct observation done as this was a telephone visit.  Patient did not sound short of breath or congested.  She was talking in full sentences.  Assessment and Plan: 1. COVID-19 virus infection Inform patient that we can prescribe the oral antiviral medication for her called Paxlovid.  Went over side effects of the medication.  I told her that since she is  doing much better I think it would be okay if she decides not to take the medicine.  Patient is agreeable with this.  Advised that she stay in quarantine for 5 days from the time of her symptoms provided her symptoms are improving which they are.  However she should continue to wear a mask for 10 days from the time of symptoms.  Patient expressed understanding.  All questions were answered.  Advised to give a call back if symptoms get worse.   Follow Up Instructions: As needed and as previously scheduled   I discussed the assessment and treatment plan with the patient. The patient was provided an opportunity to ask questions and all were answered. The patient agreed with the plan and demonstrated an understanding of the instructions.   The patient was advised to call back or seek an in-person evaluation if the symptoms worsen or if the condition fails to improve as anticipated.  I  Spent 12 minutes on this telephone encounter  Karle Plumber, MD

## 2021-09-06 ENCOUNTER — Ambulatory Visit: Payer: Self-pay

## 2021-09-06 NOTE — Telephone Encounter (Signed)
Patient is calling about being tested for positive and what to take while she in recovery. Please call back

## 2021-09-06 NOTE — Telephone Encounter (Signed)
3 rd attempt to contact patient regarding covid positive and to review treatment. No answer, left message to call clinic back .

## 2021-09-07 NOTE — Telephone Encounter (Signed)
Pt states she would like to return back to work on Monday Sept 19. 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

## 2021-09-28 ENCOUNTER — Other Ambulatory Visit: Payer: Self-pay | Admitting: Internal Medicine

## 2021-09-28 DIAGNOSIS — Z1231 Encounter for screening mammogram for malignant neoplasm of breast: Secondary | ICD-10-CM

## 2021-10-07 ENCOUNTER — Ambulatory Visit: Payer: Medicare Other | Attending: Internal Medicine | Admitting: Internal Medicine

## 2021-10-07 ENCOUNTER — Other Ambulatory Visit: Payer: Self-pay

## 2021-10-07 ENCOUNTER — Encounter: Payer: Self-pay | Admitting: Internal Medicine

## 2021-10-07 VITALS — BP 126/73 | HR 75 | Resp 16 | Wt 147.0 lb

## 2021-10-07 DIAGNOSIS — K5909 Other constipation: Secondary | ICD-10-CM

## 2021-10-07 DIAGNOSIS — M25551 Pain in right hip: Secondary | ICD-10-CM

## 2021-10-07 DIAGNOSIS — E1136 Type 2 diabetes mellitus with diabetic cataract: Secondary | ICD-10-CM | POA: Diagnosis not present

## 2021-10-07 DIAGNOSIS — G8929 Other chronic pain: Secondary | ICD-10-CM

## 2021-10-07 DIAGNOSIS — E1169 Type 2 diabetes mellitus with other specified complication: Secondary | ICD-10-CM

## 2021-10-07 DIAGNOSIS — L608 Other nail disorders: Secondary | ICD-10-CM

## 2021-10-07 DIAGNOSIS — M25511 Pain in right shoulder: Secondary | ICD-10-CM

## 2021-10-07 DIAGNOSIS — E785 Hyperlipidemia, unspecified: Secondary | ICD-10-CM

## 2021-10-07 DIAGNOSIS — F17210 Nicotine dependence, cigarettes, uncomplicated: Secondary | ICD-10-CM

## 2021-10-07 DIAGNOSIS — F17200 Nicotine dependence, unspecified, uncomplicated: Secondary | ICD-10-CM

## 2021-10-07 LAB — POCT GLYCOSYLATED HEMOGLOBIN (HGB A1C): HbA1c, POC (controlled diabetic range): 7.2 % — AB (ref 0.0–7.0)

## 2021-10-07 LAB — GLUCOSE, POCT (MANUAL RESULT ENTRY): POC Glucose: 108 mg/dl — AB (ref 70–99)

## 2021-10-07 MED ORDER — MELOXICAM 15 MG PO TABS: 15.0000 mg | ORAL_TABLET | Freq: Every day | ORAL | 2 refills | Status: DC

## 2021-10-07 NOTE — Progress Notes (Signed)
Patient ID: Bailey [name], female    DOB: 01/28/1956  MRN: [id number]  CC: Diabetes   Subjective: Bailey [name] is a 65 y.o. female who presents for chronic ds management Her concerns today include:  Pt with hx of DM, HL, OA (RT shoulder and knees), gastric ulcer, Vit D def, tob dep, thrombocytopenia.  On Gabapentin for hot flashes.   Dupuytren contracture: She has been seeing the orthopedic specialist.  She had some injections to the l right hand and also did occupational therapy.  Not getting a whole lot better.  They have decided to proceed with surgery but patient has put it off until after the first of the year.    DIABETES TYPE 2 Last A1C:   Results for orders placed or performed in visit on 10/07/21  POCT glucose (manual entry)  Result Value Ref Range   POC Glucose 108 (A) 70 - 99 mg/dl  POCT glycosylated hemoglobin (Hb A1C)  Result Value Ref Range   Hemoglobin A1C     HbA1c POC (<> result, manual entry)     HbA1c, POC (prediabetic range)     HbA1c, POC (controlled diabetic range) 7.2 (A) 0.0 - 7.0 %   A1C 7.2 Med Adherence:  '[x]'  Yes: She is on Lantus insulin 33 units, Januvia, Jardiance and Amaryl.  A1c has improved from last visit.  '[]'  No Medication side effects:  '[]'  Yes    '[x]'  No Home Monitoring?  '[x]'  Yes every morning before BF    '[]'  No Home glucose results range: 90-140 Diet Adherence: '[x]'  Yes  -does her own cooking. Rarely eats out  '[]'  No Exercise: '[x]'  Yes -walks several times a wk.  Plans to start going back to [facility]   '[]'  No Hypoglycemic episodes?: '[]'  Yes    '[x]'  No Numbness of the feet? '[]'  Yes    '[x]'  No Retinopathy hx? '[]'  Yes    '[]'  No Last eye exam: Up-to-date with eye exam. Comments:   HL: Taking and tolerating lipitor  Tob dep: She still smokes and is not ready to quit but is thinking about it.  When she is able to afford the nicotine patches she thinks she will give a trial of quitting.    Fine rash on both sides of mouth.  Does not itch.   Not sure if it is coming from mask. Present x1.5 wks  C/o chronic pain aching  RT shoulder.  We did x-ray of the shoulder back in April of this year.  It revealed chronic rotator cuff disease and possible small loose body in the joint.  She was referred to orthopedics.  She states she was told that it is likely arthritis.  She tries to keep the joint active.  She uses Biofreeze.  She is out on meloxicam and has not had it for a while. -She also reports some intermittent pain in the right hip when she steps a certain way.  It is a quick strong pain that lasts a few seconds.  Does not happen often.  Concerned about dark ridges and some of her fingernails.  She takes a multivitamin tablet daily.  Complains of issues with chronic constipation.  She takes MiraLAX about 2 to 3 days a week but even with that she feels her bowels are not moving as regular as they should.  He is to have a bowel movement every day.  She is up-to-date with colon cancer screening.    HM: plans to hold off on flu  shot for now.  We will get later in the fall.  She also wants to hold off on getting the Shingrix shot until next year.  She states that it will be free through her insurance starting next year.    Patient Active Problem List   Diagnosis Date Noted   Thrombocytopenia ([place]) 04/12/2021   Tobacco dependence 10/05/2017   Spinal stenosis of lumbar region with neurogenic claudication 10/05/2017   Osteoarthritis of spine with radiculopathy, lumbar region 10/05/2017   Plantar wart of right foot 06/01/2017   Right sided weakness 12/24/2015   Dyslipidemia with low high density lipoprotein (HDL) cholesterol with hypertriglyceridemia due to type 2 diabetes mellitus ([place]) 11/16/2015   Uncontrolled type 2 diabetes mellitus with hyperglycemia ([place]) 11/12/2015   Menopausal symptoms 11/12/2015   Dupuytren's contracture of both hands 07/05/2013   Need for Tdap vaccination 07/05/2013   Arthritis      Current Outpatient  Medications on File Prior to Visit  Medication Sig Dispense Refill   acetaminophen (TYLENOL) 500 MG tablet Take 1 tablet (500 mg total) by mouth every 6 (six) hours as needed for mild pain or moderate pain. 30 tablet 0   atorvastatin (LIPITOR) 40 MG tablet Take 1 tablet (40 mg total) by mouth daily. 90 tablet 3   Blood Glucose Monitoring Suppl (ONETOUCH VERIO) w/Device KIT Use to check blood sugar daily. 1 kit 0   Cholecalciferol (VITAMIN D3) 2000 units TABS Take 2,000 Units by mouth daily. 30 tablet 11   empagliflozin (JARDIANCE) 10 MG TABS tablet Take 1 tablet (10 mg total) by mouth daily. 90 tablet 3   gabapentin (NEURONTIN) 300 MG capsule TAKE 1 CAPSULE(300 MG) BY MOUTH AT BEDTIME 90 capsule 3   glimepiride (AMARYL) 4 MG tablet Take 2 tablets (8 mg total) by mouth daily with breakfast. 180 tablet 3   glucose blood (ONETOUCH VERIO) test strip Use as instructed to check blood sugar daily. 100 each 11   insulin glargine (LANTUS SOLOSTAR) 100 UNIT/ML Solostar Pen Inject 33 Units into the skin at bedtime. 15 mL 6   Insulin Pen Needle (BD PEN NEEDLE NANO U/F) 32G X 4 MM MISC Use once a day with insulin pen 100 each 6   Lancet Devices (ACCU-CHEK SOFTCLIX) lancets Use as instructed 1 each 0   Multiple Vitamins-Minerals (MULTIVITAMIN WITH MINERALS) tablet Take 1 tablet by mouth every other day.      OneTouch Delica Lancets 07H MISC Use to check blood sugar daily. 100 each 11   sitaGLIPtin (JANUVIA) 100 MG tablet TAKE 1 TABLET(100 MG) BY MOUTH DAILY 90 tablet 3   VIVELLE-DOT 0.025 MG/24HR PLACE 1 PATCH ONTO THE SKIN 2 TIMES A WEEK. 8 patch 12   Current Facility-Administered Medications on File Prior to Visit  Medication Dose Route Frequency Provider Last Rate Last Admin   0.9 %  sodium chloride infusion  500 mL Intravenous Once Nandigam, Venia Minks, MD        Allergies  Allergen Reactions   Metformin And Related Diarrhea   Shellfish Allergy Anaphylaxis, Hives and Swelling    Lobster, crab  (shrimp's ok)   Ivp Dye [Iodinated Diagnostic Agents] Hives   Adhesive [Tape] Other (See Comments)    blisters   Chantix [Varenicline] Other (See Comments)    depression   Codeine Other (See Comments)    Unknown childhood allergy   Hydrocodone-Acetaminophen    Latex Other (See Comments)    blisters   Penicillins Other (See Comments)    Unknown childhood reaction  Social History   Socioeconomic History   Marital status: Married    Spouse name: Louie Casa   Number of children: 0   Years of education: 15   Highest education level: Not on file  Occupational History   Occupation: Dispensing optician: [employer]     Comment: Civil engineer, contracting  Tobacco Use   Smoking status: Every Day    Packs/day: 0.25    Years: 28.00    Pack years: 7.00    Types: Cigarettes   Smokeless tobacco: Never  Vaping Use   Vaping Use: Never used  Substance and Sexual Activity   Alcohol use: No    Alcohol/week: 0.0 standard drinks   Drug use: No   Sexual activity: Yes    Partners: Male    Birth control/protection: Surgical  Other Topics Concern   Not on file  Social History Narrative   Currently living alone, separated from husband.  One story home.  [name]'s children are adults-2 live in [place], 2 live in [place].     Works for a [employer].     Education: 2 years of college.   Social Determinants of Health   Financial Resource Strain: Not on file  Food Insecurity: Not on file  Transportation Needs: Not on file  Physical Activity: Not on file  Stress: Not on file  Social Connections: Not on file  Intimate Partner Violence: Not on file    Family History  Problem Relation Age of Onset   Diabetes Mother    Hypertension Mother    Dementia Mother    Pancreatitis Mother    Asthma Mother    COPD Mother    Stroke Mother    Hyperlipidemia Mother    Heart disease Mother    Hypertension Father    Hypertension Sister    Diabetes Sister    Hyperlipidemia Sister     Hypertension Brother    Diabetes Brother    Drug abuse Brother    Cancer Brother        colon   Hyperlipidemia Brother    Colon cancer Brother 56   Hyperlipidemia Brother    Hypertension Brother    Hyperlipidemia Sister    Hypertension Sister    Diabetes Sister    Diabetes Maternal Grandmother    Colon cancer Maternal Grandmother    Breast cancer Neg Hx    Esophageal cancer Neg Hx    Stomach cancer Neg Hx     Past Surgical History:  Procedure Laterality Date   COLONOSCOPY  last one 07-15-2016   ESOPHAGOGASTRODUODENOSCOPY  last one 2008   FASCIECTOMY Left 08/24/2016   Procedure: LEFT HAND PALMER AND SMALL FINGER DIGiTAL FASCIECTOMY AND RELEASE;  Surgeon: Iran Planas, MD;  Location: [facility];  Service: Orthopedics;  Laterality: Left;   LAPAROSCOPIC CHOLECYSTECTOMY  07/17/2000   ROTATOR CUFF REPAIR Right 2000   TOTAL ABDOMINAL HYSTERECTOMY W/ BILATERAL SALPINGOOPHORECTOMY  1998    ROS: Review of Systems Negative except as stated above  PHYSICAL EXAM: BP 126/73   Pulse 75   Resp 16   Wt 147 lb (66.7 kg)   SpO2 98%   BMI 26.89 kg/m   Wt Readings from Last 3 Encounters:  10/07/21 147 lb (66.7 kg)  04/12/21 150 lb 9.6 oz (68.3 kg)  03/02/21 146 lb 12.8 oz (66.6 kg)    Physical Exam  General appearance - alert, well appearing, and in no distress Mental status - normal mood, behavior, speech, dress, motor activity, and  thought processes Chest - clear to auscultation, no wheezes, rales or rhonchi, symmetric air entry Heart - normal rate, regular rhythm, normal S1, S2, no murmurs, rubs, clicks or gallops Musculoskeletal -she has flexion of the right fifth finger at the PIP joint.  Skin in the palm of the right hand is thickened.  Right shoulder: Slight tenderness on palpation of the joint anteriorly.  Drop arm test negative.  Right hip: She has good flexion and extension and rotation.  She reports some discomfort going towards the right groin with  rotation of the hip. Extremities - peripheral pulses normal, no pedal edema, no clubbing or cyanosis Nails: Several of the nails have vertical dark stripes. Diabetic Foot Exam - Simple   Simple Foot Form Visual Inspection No deformities, no ulcerations, no other skin breakdown bilaterally: Yes Sensation Testing Intact to touch and monofilament testing bilaterally: Yes Pulse Check Posterior Tibialis and Dorsalis pulse intact bilaterally: Yes Comments     CMP Latest Ref Rng & Units 04/12/2021 08/21/2019 07/06/2018  Glucose 65 - 99 mg/dL 148(H) 365(H) 143(H)  BUN 8 - 27 mg/dL '10 11 9  ' Creatinine 0.57 - 1.00 mg/dL 0.81 0.65 0.79  Sodium 134 - 144 mmol/L 145(H) 138 145(H)  Potassium 3.5 - 5.2 mmol/L 3.9 4.3 3.8  Chloride 96 - 106 mmol/L 106 100 106  CO2 20 - 29 mmol/L '24 21 25  ' Calcium 8.7 - 10.3 mg/dL 9.7 9.2 9.3  Total Protein 6.0 - 8.5 g/dL 7.1 6.8 6.9  Total Bilirubin 0.0 - 1.2 mg/dL 0.3 0.2 0.3  Alkaline Phos 44 - 121 IU/L 82 103 86  AST 0 - 40 IU/L '19 18 28  ' ALT 0 - 32 IU/L 29 20 41(H)   Lipid Panel     Component Value Date/Time   CHOL 120 04/12/2021 1131   TRIG 128 04/12/2021 1131   HDL 51 04/12/2021 1131   CHOLHDL 2.4 04/12/2021 1131   CHOLHDL 7.0 (H) 11/12/2015 1153   VLDL NOT CALC 11/12/2015 1153   LDLCALC 47 04/12/2021 1131   LDLDIRECT 45 06/10/2016 1213    CBC    Component Value Date/Time   WBC 8.1 04/12/2021 1131   WBC 7.6 08/22/2015 1009   RBC 4.23 04/12/2021 1131   RBC 4.66 08/22/2015 1009   HGB 13.6 04/12/2021 1131   HCT 40.5 04/12/2021 1131   PLT 120 (L) 04/12/2021 1131   MCV 96 04/12/2021 1131   MCH 32.2 04/12/2021 1131   MCH 31.1 08/22/2015 1009   MCHC 33.6 04/12/2021 1131   MCHC 33.6 08/22/2015 1009   RDW 12.9 04/12/2021 1131   LYMPHSABS 2.4 08/22/2015 1009   MONOABS 0.7 08/22/2015 1009   EOSABS 0.1 08/22/2015 1009   BASOSABS 0.0 08/22/2015 1009    ASSESSMENT AND PLAN: 1. Type 2 diabetes mellitus with cataract ([place]) Close to goal.   Encouraged her to continue current medications, healthy eating and regular exercise. - POCT glucose (manual entry) - POCT glycosylated hemoglobin (Hb A1C)  2. Hyperlipidemia associated with type 2 diabetes mellitus ([facility]) Continue Lipitor.  LDL this year was 47.  3. Chronic right shoulder pain - meloxicam (MOBIC) 15 MG tablet; Take 1 tablet (15 mg total) by mouth daily.  Dispense: 90 tablet; Refill: 2  4. Chronic pain of right hip Most likely arthritis - meloxicam (MOBIC) 15 MG tablet; Take 1 tablet (15 mg total) by mouth daily.  Dispense: 90 tablet; Refill: 2  5. Change in nail appearance We will observe for now.  She is not  anemic.  She will check to see whether biotin is contained within her daily multivitamin tablet  6. Chronic constipation Advised her to use the MiraLAX every day.  If she continues to have constipation despite that, then we will add Colace.  Increase fiber in the diet.  7. Tobacco dependence Advised to quit.  Patient not quite ready to quit.  When she is, I told her that she can call [phone] quit now to get the nicotine patches for free.     Patient was given the opportunity to ask questions.  Patient verbalized understanding of the plan and was able to repeat key elements of the plan.   Orders Placed This Encounter  Procedures   POCT glucose (manual entry)   POCT glycosylated hemoglobin (Hb A1C)     Requested Prescriptions   Signed Prescriptions Disp Refills   meloxicam (MOBIC) 15 MG tablet 90 tablet 2    Sig: Take 1 tablet (15 mg total) by mouth daily.    Return in about 4 months (around 02/07/2022).  Karle Plumber, MD, FACP

## 2021-10-07 NOTE — Patient Instructions (Signed)
Try using the MiraLAX every day. I have sent a prescription to the pharmacy for the arthritis medication called meloxicam. Do not forget to get your flu shot this fall.

## 2021-10-26 ENCOUNTER — Other Ambulatory Visit: Payer: Self-pay

## 2021-10-26 ENCOUNTER — Ambulatory Visit
Admission: RE | Admit: 2021-10-26 | Discharge: 2021-10-26 | Disposition: A | Discharge status: Home / Self Care | Payer: Medicare Other | Source: Ambulatory Visit | Attending: Internal Medicine | Admitting: Internal Medicine

## 2021-10-26 DIAGNOSIS — Z1231 Encounter for screening mammogram for malignant neoplasm of breast: Secondary | ICD-10-CM

## 2021-11-24 LAB — LAB REPORT - SCANNED: A1c: 7.3

## 2022-01-23 ENCOUNTER — Other Ambulatory Visit: Payer: Self-pay | Admitting: Internal Medicine

## 2022-01-23 DIAGNOSIS — E1136 Type 2 diabetes mellitus with diabetic cataract: Secondary | ICD-10-CM

## 2022-01-24 NOTE — Telephone Encounter (Signed)
Requested Prescriptions  Pending Prescriptions Disp Refills   LANTUS SOLOSTAR 100 UNIT/ML Solostar Pen [Pharmacy Med Name: LANTUS SOLOSTAR 100 UNIT/ML] 15 mL 2    Sig: INJECT 33 UNTIS INTO SKIN AT BEDTIME     Endocrinology:  Diabetes - Insulins Passed - 01/23/2022  5:07 PM      Passed - HBA1C is between 0 and 7.9 and within 180 days    HbA1c, POC (controlled diabetic range)  Date Value Ref Range Status  10/07/2021 7.2 (A) 0.0 - 7.0 % Final         Passed - Valid encounter within last 6 months    Recent Outpatient Visits          3 months ago Type 2 diabetes mellitus with cataract [facility])   [place] Ladell Pier, MD   4 months ago COVID-19 virus infection   [place], MD   9 months ago Type 2 diabetes mellitus with cataract [facility])   [place], MD   10 months ago Encounter for [employer] annual wellness exam   [place] Karle Plumber B, MD   2 years ago Need for vaccination against Streptococcus pneumoniae using pneumococcal conjugate vaccine [place], RPH-CPP      Future Appointments            In 2 weeks Ladell Pier, MD [place]

## 2022-02-08 ENCOUNTER — Ambulatory Visit: Payer: Medicare Other | Attending: Internal Medicine | Admitting: Internal Medicine

## 2022-02-08 ENCOUNTER — Encounter: Payer: Self-pay | Admitting: Internal Medicine

## 2022-02-08 ENCOUNTER — Other Ambulatory Visit: Payer: Self-pay

## 2022-02-08 VITALS — BP 130/75 | HR 74 | Resp 16 | Wt 146.4 lb

## 2022-02-08 DIAGNOSIS — E1169 Type 2 diabetes mellitus with other specified complication: Secondary | ICD-10-CM | POA: Diagnosis not present

## 2022-02-08 DIAGNOSIS — Z23 Encounter for immunization: Secondary | ICD-10-CM

## 2022-02-08 DIAGNOSIS — R030 Elevated blood-pressure reading, without diagnosis of hypertension: Secondary | ICD-10-CM | POA: Diagnosis not present

## 2022-02-08 DIAGNOSIS — D696 Thrombocytopenia, unspecified: Secondary | ICD-10-CM

## 2022-02-08 DIAGNOSIS — E785 Hyperlipidemia, unspecified: Secondary | ICD-10-CM | POA: Diagnosis not present

## 2022-02-08 DIAGNOSIS — F17200 Nicotine dependence, unspecified, uncomplicated: Secondary | ICD-10-CM

## 2022-02-08 LAB — POCT GLYCOSYLATED HEMOGLOBIN (HGB A1C): HbA1c, POC (controlled diabetic range): 7.2 % — AB (ref 0.0–7.0)

## 2022-02-08 LAB — GLUCOSE, POCT (MANUAL RESULT ENTRY): POC Glucose: 125 mg/dl — AB (ref 70–99)

## 2022-02-08 MED ORDER — ZOSTER VAC RECOMB ADJUVANTED 50 MCG/0.5ML IM SUSR: 0.5000 mL | Freq: Once | INTRAMUSCULAR | 0 refills | Status: AC

## 2022-02-08 MED ORDER — GLIMEPIRIDE 4 MG PO TABS: 4.0000 mg | ORAL_TABLET | Freq: Every day | ORAL | 3 refills | Status: DC

## 2022-02-08 NOTE — Progress Notes (Signed)
Patient ID: Bailey [name], female    DOB: [birth year]  MRN: [id number]  CC: Chronic disease management.  Subjective: Bailey [name] is a 66 y.o. female who presents for chronic disease management. Her concerns today include:  Pt with hx of DM, HL, OA (RT shoulder and knees), Dupuytren's contractures, gastric ulcer, Vit D def, tob dep, thrombocytopenia.  On Gabapentin for hot flashes.   DIABETES TYPE 2 Last A1C:   Results for orders placed or performed in visit on 02/08/22  POCT glucose (manual entry)  Result Value Ref Range   POC Glucose 125 (A) 70 - 99 mg/dl  POCT glycosylated hemoglobin (Hb A1C)  Result Value Ref Range   Hemoglobin A1C     HbA1c POC (<> result, manual entry)     HbA1c, POC (prediabetic range)     HbA1c, POC (controlled diabetic range) 7.2 (A) 0.0 - 7.0 %    Med Adherence:  _0  Yes.  Patient on Lantus 33 units at bedtime, Januvia 100 mg daily, glimepiride 8 mg daily and Jardiance 10 mg daily Medication side effects:  _1  Yes    _2  No Home Monitoring?  _3  Yes be BF    _4  No Home glucose results range:80-130 with #s in the 70s at least 2 mornings a wk. not able to feel that blood sugar is low until ranges in the 50s. Diet Adherence: _5  Yes    _6  No Exercise: _7  Yes  -walking daily except for cold and rainy weather Hypoglycemic episodes?: _8  Yes  - lowest once time was 50.  She was able to drink OJ.  She keeps Peppermint ball or juice with her to use if BS drop low Numbness of the feet? _9  Yes    _10  No Retinopathy hx? _11  Yes    _12  No Last eye exam: Up-to-date with eye exam. Comments:   HL: Taking and tolerating Lipitor without muscle aches or cramps.  Tobacco dependence:  not ready to quit as of last visit.  Started smoking a bit more post death of her sister 10-19-21 and trying to help her other sister.  Feels too much stress to quit. Smoking 12 cigarettes a day.    BP low today on initial check.  No dizziness.  Repeat blood pressure higher.   Reports eating some meatballs and chicken wings for the [employer] this past weekend.  She thinks this may have pushed her blood pressure up.  She does have a home blood pressure device and reports that her blood pressure has always been good.  She has chronic mild low PLT count.  No bruising or bleeding. Patient Active Problem List   Diagnosis Date Noted   Thrombocytopenia ([place]) 04/12/2021   Tobacco dependence 10/05/2017   Spinal stenosis of lumbar region with neurogenic claudication 10/05/2017   Osteoarthritis of spine with radiculopathy, lumbar region 10/05/2017   Plantar wart of right foot 06/01/2017   Right sided weakness 12/24/2015   Dyslipidemia with low high density lipoprotein (HDL) cholesterol with hypertriglyceridemia due to type 2 diabetes mellitus ([place]) 11/16/2015   Uncontrolled type 2 diabetes mellitus with hyperglycemia ([place]) 11/12/2015   Menopausal symptoms 11/12/2015   Dupuytren's contracture of both hands 07/05/2013   Need for Tdap vaccination 07/05/2013   Arthritis      Current Outpatient Medications on File Prior to Visit  Medication Sig Dispense Refill   acetaminophen (TYLENOL) 500 MG tablet Take 1 tablet (500 mg total) by mouth every 6 (six) hours as needed for mild pain or  moderate pain. 30 tablet 0   atorvastatin (LIPITOR) 40 MG tablet Take 1 tablet (40 mg total) by mouth daily. 90 tablet 3   Blood Glucose Monitoring Suppl (ONETOUCH VERIO) w/Device KIT Use to check blood sugar daily. 1 kit 0   Cholecalciferol (VITAMIN D3) 2000 units TABS Take 2,000 Units by mouth daily. 30 tablet 11   empagliflozin (JARDIANCE) 10 MG TABS tablet Take 1 tablet (10 mg total) by mouth daily. 90 tablet 3   gabapentin (NEURONTIN) 300 MG capsule TAKE 1 CAPSULE(300 MG) BY MOUTH AT BEDTIME 90 capsule 3   glimepiride (AMARYL) 4 MG tablet Take 2 tablets (8 mg total) by mouth daily with breakfast. 180 tablet 3   glucose blood (ONETOUCH VERIO) test strip Use as instructed to check blood sugar  daily. 100 each 11   Insulin Pen Needle (BD PEN NEEDLE NANO U/F) 32G X 4 MM MISC Use once a day with insulin pen 100 each 6   Lancet Devices (ACCU-CHEK SOFTCLIX) lancets Use as instructed 1 each 0   LANTUS SOLOSTAR 100 UNIT/ML Solostar Pen INJECT 33 UNTIS INTO SKIN AT BEDTIME 15 mL 2   meloxicam (MOBIC) 15 MG tablet Take 1 tablet (15 mg total) by mouth daily. 90 tablet 2   Multiple Vitamins-Minerals (MULTIVITAMIN WITH MINERALS) tablet Take 1 tablet by mouth every other day.      OneTouch Delica Lancets 35T MISC Use to check blood sugar daily. 100 each 11   sitaGLIPtin (JANUVIA) 100 MG tablet TAKE 1 TABLET(100 MG) BY MOUTH DAILY 90 tablet 3   VIVELLE-DOT 0.025 MG/24HR PLACE 1 PATCH ONTO THE SKIN 2 TIMES A WEEK. 8 patch 12   Current Facility-Administered Medications on File Prior to Visit  Medication Dose Route Frequency Provider Last Rate Last Admin   0.9 %  sodium chloride infusion  500 mL Intravenous Once Nandigam, Venia Minks, MD        Allergies  Allergen Reactions   Metformin And Related Diarrhea   Shellfish Allergy Anaphylaxis, Hives and Swelling    Lobster, crab (shrimp's ok)   Ivp Dye [Iodinated Contrast Media] Hives   Adhesive [Tape] Other (See Comments)    blisters   Chantix [Varenicline] Other (See Comments)    depression   Codeine Other (See Comments)    Unknown childhood allergy   Hydrocodone-Acetaminophen    Latex Other (See Comments)    blisters   Penicillins Other (See Comments)    Unknown childhood reaction    Social History   Socioeconomic History   Marital status: Married    Spouse name: Louie Casa   Number of children: 0   Years of education: 15   Highest education level: Not on file  Occupational History   Occupation: Dispensing optician: [employer]     Comment: Civil engineer, contracting  Tobacco Use   Smoking status: Every Day    Packs/day: 0.25    Years: 28.00    Pack years: 7.00    Types: Cigarettes   Smokeless tobacco: Never  Vaping Use    Vaping Use: Never used  Substance and Sexual Activity   Alcohol use: No    Alcohol/week: 0.0 standard drinks   Drug use: No   Sexual activity: Yes    Partners: Male    Birth control/protection: Surgical  Other Topics Concern   Not on file  Social History Narrative   Currently living alone, separated from husband.  One story home.  [name]'s children are adults-2 live in [place], 2 live in  [place].     Works for a [employer].     Education: 2 years of college.   Social Determinants of Health   Financial Resource Strain: Not on file  Food Insecurity: Not on file  Transportation Needs: Not on file  Physical Activity: Not on file  Stress: Not on file  Social Connections: Not on file  Intimate Partner Violence: Not on file    Family History  Problem Relation Age of Onset   Diabetes Mother    Hypertension Mother    Dementia Mother    Pancreatitis Mother    Asthma Mother    COPD Mother    Stroke Mother    Hyperlipidemia Mother    Heart disease Mother    Hypertension Father    Hypertension Sister    Diabetes Sister    Hyperlipidemia Sister    Hypertension Brother    Diabetes Brother    Drug abuse Brother    Cancer Brother        colon   Hyperlipidemia Brother    Colon cancer Brother 32   Hyperlipidemia Brother    Hypertension Brother    Hyperlipidemia Sister    Hypertension Sister    Diabetes Sister    Diabetes Maternal Grandmother    Colon cancer Maternal Grandmother    Breast cancer Neg Hx    Esophageal cancer Neg Hx    Stomach cancer Neg Hx     Past Surgical History:  Procedure Laterality Date   COLONOSCOPY  last one 07-15-2016   ESOPHAGOGASTRODUODENOSCOPY  last one 2008   FASCIECTOMY Left 08/24/2016   Procedure: LEFT HAND PALMER AND SMALL FINGER DIGiTAL FASCIECTOMY AND RELEASE;  Surgeon: Iran Planas, MD;  Location: [facility];  Service: Orthopedics;  Laterality: Left;   LAPAROSCOPIC CHOLECYSTECTOMY  07/17/2000   ROTATOR CUFF  REPAIR Right 2000   TOTAL ABDOMINAL HYSTERECTOMY W/ BILATERAL SALPINGOOPHORECTOMY  1998    ROS: Review of Systems Negative except as stated above  PHYSICAL EXAM: BP 130/75    Pulse 74    Resp 16    Wt 146 lb 6.4 oz (66.4 kg)    SpO2 99%    BMI 26.78 kg/m   Wt Readings from Last 3 Encounters:  02/08/22 146 lb 6.4 oz (66.4 kg)  10/07/21 147 lb (66.7 kg)  04/12/21 150 lb 9.6 oz (68.3 kg)    Physical Exam  General appearance - alert, well appearing, and in no distress Mental status - normal mood, behavior, speech, dress, motor activity, and thought processes Neck - supple, no significant adenopathy Chest - clear to auscultation, no wheezes, rales or rhonchi, symmetric air entry Heart - normal rate, regular rhythm, normal S1, S2, no murmurs, rubs, clicks or gallops Extremities - peripheral pulses normal, no pedal edema, no clubbing or cyanosis Diabetic Foot Exam - Simple   Simple Foot Form Diabetic Foot exam was performed with the following findings: Yes 02/08/2022 12:00 PM  Visual Inspection See comments: Yes Sensation Testing Intact to touch and monofilament testing bilaterally: Yes Pulse Check Posterior Tibialis and Dorsalis pulse intact bilaterally: Yes Comments No toenail on right big toe.      CMP Latest Ref Rng & Units 04/12/2021 08/21/2019 07/06/2018  Glucose 65 - 99 mg/dL 148(H) 365(H) 143(H)  BUN 8 - 27 mg/dL _0 Creatinine 0.57 - 1.00 mg/dL 0.81 0.65 0.79  Sodium 134 - 144 mmol/L 145(H) 138 145(H)  Potassium 3.5 - 5.2 mmol/L 3.9 4.3 3.8  Chloride 96 - 106  mmol/L 106 100 106  CO2 20 - 29 mmol/L _0 Calcium 8.7 - 10.3 mg/dL 9.7 9.2 9.3  Total Protein 6.0 - 8.5 g/dL 7.1 6.8 6.9  Total Bilirubin 0.0 - 1.2 mg/dL 0.3 0.2 0.3  Alkaline Phos 44 - 121 IU/L 82 103 86  AST 0 - 40 IU/L _1 ALT 0 - 32 IU/L 29 20 41(H)   Lipid Panel     Component Value Date/Time   CHOL 120 04/12/2021 1131   TRIG 128 04/12/2021 1131   HDL 51 04/12/2021 1131   CHOLHDL  2.4 04/12/2021 1131   CHOLHDL 7.0 (H) 11/12/2015 1153   VLDL NOT CALC 11/12/2015 1153   LDLCALC 47 04/12/2021 1131   LDLDIRECT 45 06/10/2016 1213    CBC    Component Value Date/Time   WBC 8.1 04/12/2021 1131   WBC 7.6 08/22/2015 1009   RBC 4.23 04/12/2021 1131   RBC 4.66 08/22/2015 1009   HGB 13.6 04/12/2021 1131   HCT 40.5 04/12/2021 1131   PLT 120 (L) 04/12/2021 1131   MCV 96 04/12/2021 1131   MCH 32.2 04/12/2021 1131   MCH 31.1 08/22/2015 1009   MCHC 33.6 04/12/2021 1131   MCHC 33.6 08/22/2015 1009   RDW 12.9 04/12/2021 1131   LYMPHSABS 2.4 08/22/2015 1009   MONOABS 0.7 08/22/2015 1009   EOSABS 0.1 08/22/2015 1009   BASOSABS 0.0 08/22/2015 1009    ASSESSMENT AND PLAN: 1. Type 2 diabetes mellitus with hyperlipidemia (HCC) A1c close to goal.  However patient has been having some hypoglycemic episodes where she is not able to feel that the blood sugar is low until in the 50s.  Wakes up with blood sugars in the 70s at least about twice a week.  I recommend decreasing the Amaryl from 8 mg daily to 4 mg daily.  Continue Jardiance, Januvia and current dose of Lantus. - POCT glucose (manual entry) - POCT glycosylated hemoglobin (Hb A1C) - CBC - Comprehensive metabolic panel - Lipid panel  2. Tobacco dependence Continue to advised to quit.  She is aware of health risks associated with smoking.  Not ready to give a trial of quitting.  3. Thrombocytopenia ([place]) This has remained mild and stable.  Recheck CBC today.  She is asymptomatic.  4. Elevated blood pressure reading without diagnosis of hypertension DASH diet discussed and encouraged.  Advised to check blood pressure a few times a month with normal blood pressure being 120/80 or lower.  5. Need for vaccination against Streptococcus pneumoniae Given PCV 23 today.     Patient was given the opportunity to ask questions.  Patient verbalized understanding of the plan and was able to repeat key elements of the plan.    Orders Placed This Encounter  Procedures   Pneumococcal polysaccharide vaccine 23-valent greater than or equal to 2yo subcutaneous/IM   CBC   Comprehensive metabolic panel   Lipid panel   POCT glucose (manual entry)   POCT glycosylated hemoglobin (Hb A1C)     Requested Prescriptions    No prescriptions requested or ordered in this encounter    Return in about 4 months (around 06/08/2022).  Karle Plumber, MD, FACP

## 2022-02-08 NOTE — Patient Instructions (Signed)
Decrease the Amaryl to 4 mg once a day. Check your blood pressure at least twice a month with the goal being 130/80 or lower.  Normal blood pressure is 120/80 or lower.  Try to limit salt in the foods as much as possible.  Check with your insurance to see whether the shingles vaccine is covered.  If it is we can give you a prescription for it on your next visit.

## 2022-02-09 ENCOUNTER — Telehealth: Payer: Self-pay | Admitting: Internal Medicine

## 2022-02-09 LAB — LIPID PANEL
Chol/HDL Ratio: 2.5 ratio (ref 0.0–4.4)
Cholesterol, Total: 97 mg/dL — ABNORMAL LOW (ref 100–199)
HDL: 39 mg/dL — ABNORMAL LOW (ref >39)
LDL Chol Calc (NIH): 41 mg/dL (ref 0–99)
Triglycerides: 88 mg/dL (ref 0–149)
VLDL Cholesterol Cal: 17 mg/dL (ref 5–40)

## 2022-02-09 LAB — CBC
Hematocrit: 44.3 % (ref 34.0–46.6)
Hemoglobin: 14.8 g/dL (ref 11.1–15.9)
MCH: 31.8 pg (ref 26.6–33.0)
MCHC: 33.4 g/dL (ref 31.5–35.7)
MCV: 95 fL (ref 79–97)
Platelets: 118 10*3/uL — ABNORMAL LOW (ref 150–450)
RBC: 4.65 x10E6/uL (ref 3.77–5.28)
RDW: 13.5 % (ref 11.7–15.4)
WBC: 7.8 10*3/uL (ref 3.4–10.8)

## 2022-02-09 LAB — COMPREHENSIVE METABOLIC PANEL
ALT: 34 IU/L — ABNORMAL HIGH (ref 0–32)
AST: 19 IU/L (ref 0–40)
Albumin/Globulin Ratio: 1.8 (ref 1.2–2.2)
Albumin: 4.5 g/dL (ref 3.8–4.8)
Alkaline Phosphatase: 74 IU/L (ref 44–121)
BUN/Creatinine Ratio: 13 (ref 12–28)
BUN: 11 mg/dL (ref 8–27)
Bilirubin Total: 0.4 mg/dL (ref 0.0–1.2)
CO2: 25 mmol/L (ref 20–29)
Calcium: 9.5 mg/dL (ref 8.7–10.3)
Chloride: 109 mmol/L — ABNORMAL HIGH (ref 96–106)
Creatinine, Ser: 0.86 mg/dL (ref 0.57–1.00)
Globulin, Total: 2.5 g/dL (ref 1.5–4.5)
Glucose: 117 mg/dL — ABNORMAL HIGH (ref 70–99)
Potassium: 4 mmol/L (ref 3.5–5.2)
Sodium: 146 mmol/L — ABNORMAL HIGH (ref 134–144)
Total Protein: 7 g/dL (ref 6.0–8.5)
eGFR: 74 mL/min/{1.73_m2} (ref >59)

## 2022-02-09 NOTE — Telephone Encounter (Signed)
Phone call placed to patient this a.m. to go over lab results. Platelet count still mildly low but stable for the past 3 years.  Lab made note that there may be some clumping of cells in the tube which can give a falsely low count.  Advised patient that the next time we draw blood I will have it drawn in a special tube that prevents clumping so that we can get an accurate true count of her platelet cells.  Sodium level mildly elevated.  Advised to drink adequate fluids during the day.  Slight elevation in one of her LFTs.  Likely due to atorvastatin.  Observe for now. Kidney function okay. Cholesterol level at goal.  Patient expressed understanding.  All questions were answered.  Results for orders placed or performed in visit on 02/08/22  CBC  Result Value Ref Range   WBC 7.8 3.4 - 10.8 x10E3/uL   RBC 4.65 3.77 - 5.28 x10E6/uL   Hemoglobin 14.8 11.1 - 15.9 g/dL   Hematocrit 44.3 34.0 - 46.6 %   MCV 95 79 - 97 fL   MCH 31.8 26.6 - 33.0 pg   MCHC 33.4 31.5 - 35.7 g/dL   RDW 13.5 11.7 - 15.4 %   Platelets 118 (L) 150 - 450 x10E3/uL   Hematology Comments: Note:   Comprehensive metabolic panel  Result Value Ref Range   Glucose 117 (H) 70 - 99 mg/dL   BUN 11 8 - 27 mg/dL   Creatinine, Ser 0.86 0.57 - 1.00 mg/dL   eGFR 74 >59 mL/min/1.73   BUN/Creatinine Ratio 13 12 - 28   Sodium 146 (H) 134 - 144 mmol/L   Potassium 4.0 3.5 - 5.2 mmol/L   Chloride 109 (H) 96 - 106 mmol/L   CO2 25 20 - 29 mmol/L   Calcium 9.5 8.7 - 10.3 mg/dL   Total Protein 7.0 6.0 - 8.5 g/dL   Albumin 4.5 3.8 - 4.8 g/dL   Globulin, Total 2.5 1.5 - 4.5 g/dL   Albumin/Globulin Ratio 1.8 1.2 - 2.2   Bilirubin Total 0.4 0.0 - 1.2 mg/dL   Alkaline Phosphatase 74 44 - 121 IU/L   AST 19 0 - 40 IU/L   ALT 34 (H) 0 - 32 IU/L  Lipid panel  Result Value Ref Range   Cholesterol, Total 97 (L) 100 - 199 mg/dL   Triglycerides 88 0 - 149 mg/dL   HDL 39 (L) >39 mg/dL   VLDL Cholesterol Cal 17 5 - 40 mg/dL   LDL Chol Calc  (NIH) 41 0 - 99 mg/dL   Chol/HDL Ratio 2.5 0.0 - 4.4 ratio  POCT glucose (manual entry)  Result Value Ref Range   POC Glucose 125 (A) 70 - 99 mg/dl  POCT glycosylated hemoglobin (Hb A1C)  Result Value Ref Range   Hemoglobin A1C     HbA1c POC (<> result, manual entry)     HbA1c, POC (prediabetic range)     HbA1c, POC (controlled diabetic range) 7.2 (A) 0.0 - 7.0 %

## 2022-04-06 ENCOUNTER — Encounter: Payer: Self-pay | Admitting: Podiatry

## 2022-04-06 ENCOUNTER — Ambulatory Visit: Payer: Medicare Other | Admitting: Podiatry

## 2022-04-06 DIAGNOSIS — E1165 Type 2 diabetes mellitus with hyperglycemia: Secondary | ICD-10-CM

## 2022-04-06 NOTE — Progress Notes (Signed)
?  Subjective:  ?Patient ID: Bailey [name], female    DOB: 1956/10/17,   MRN: [id number] ? ?Chief Complaint  ?Patient presents with  ? Routine foot care  ?  Diabetic foot exam- a1c 7.2 pt mentioned she has never got her feet checked. Was referred for routine foot care   ? ? ?66 y.o. female presents for diabetic foot check. Relates overall feet are doing well but wanted to having them checked.. Requesting to have them trimmed today. Denies any burning or tingling in her feet. Patient is diabetic and last A1c was 7.2.  ? . Denies any other pedal complaints. Denies n/v/f/c.  ? ?PCP: Karle Plumber MD  ? ?Past Medical History:  ?Diagnosis Date  ? Arthritis   ? RIGHT shoulder, knees  ? Cervical radiculopathy at C5   ? Dupuytren's contracture of both hands   ? L > R  ? Dyslipidemia   ? History of adenomatous polyp of colon   ? 1997 and 2003    ? History of chronic pelvic inflammatory disease   ? History of gastric ulcer   ? 2008  ? Hypertriglyceridemia   ? Right carpal tunnel syndrome   ? Type 2 diabetes mellitus, uncontrolled   ? last A1c 8.9 on 07-07-2016  ? Vitamin D deficiency   ? ? ?Objective:  ?Physical Exam: ?Vascular: DP/PT pulses 2/4 bilateral. CFT <3 seconds. Absent hair growth on digits. Edema noted to bilateral lower extremities. Xerosis noted bilaterally.  ?Skin. No lacerations or abrasions bilateral feet. Nails 1-5 bilateral  are thickened discolored and elongated with subungual debris.  ?Musculoskeletal: MMT 5/5 bilateral lower extremities in DF, PF, Inversion and Eversion. Deceased ROM in DF of ankle joint.  ?Neurological: Sensation intact to light touch. Protective sensation intact bilateral.  ? ? ?Assessment:  ? ?1. Uncontrolled type 2 diabetes mellitus with hyperglycemia ([place])   ? ? ? ?Plan:  ?Patient was evaluated and treated and all questions answered. ?-Discussed and educated patient on diabetic foot care, especially with  ?regards to the vascular, neurological and musculoskeletal  systems.  ?-Stressed the importance of good glycemic control and the detriment of not  ?controlling glucose levels in relation to the foot. ?-Discussed supportive shoes at all times and checking feet regularly.  ?-Mechanically debrided all nails 1-5 bilateral using sterile nail nipper and filed with dremel without incident as courtesy.  ?-Answered all patient questions ?-Patient to return  in 3 months for at risk foot care ?-Patient advised to call the office if any problems or questions arise in the meantime. ? ? ?Lorenda Peck, DPM  ? ? ?

## 2022-04-25 DIAGNOSIS — H5203 Hypermetropia, bilateral: Secondary | ICD-10-CM | POA: Diagnosis not present

## 2022-04-25 DIAGNOSIS — H2513 Age-related nuclear cataract, bilateral: Secondary | ICD-10-CM | POA: Diagnosis not present

## 2022-04-25 DIAGNOSIS — E119 Type 2 diabetes mellitus without complications: Secondary | ICD-10-CM | POA: Diagnosis not present

## 2022-04-25 DIAGNOSIS — H10413 Chronic giant papillary conjunctivitis, bilateral: Secondary | ICD-10-CM | POA: Diagnosis not present

## 2022-04-27 ENCOUNTER — Encounter: Payer: Self-pay | Admitting: Internal Medicine

## 2022-04-28 ENCOUNTER — Other Ambulatory Visit: Payer: Self-pay | Admitting: Internal Medicine

## 2022-04-28 DIAGNOSIS — G8929 Other chronic pain: Secondary | ICD-10-CM

## 2022-04-29 NOTE — Telephone Encounter (Signed)
Requested Prescriptions  ?Pending Prescriptions Disp Refills  ?? meloxicam (MOBIC) 15 MG tablet [Pharmacy Med Name: MELOXICAM 15 MG TABLET] 90 tablet 2  ?  Sig: TAKE 1 TABLET (15 MG TOTAL) BY MOUTH DAILY.  ?  ? Analgesics:  COX2 Inhibitors Failed - 04/28/2022  9:58 PM  ?  ?  Failed - Manual Review: Labs are only required if the patient has taken medication for more than 8 weeks.  ?  ?  Failed - ALT in normal range and within 360 days  ?  ALT  ?Date Value Ref Range Status  ?02/08/2022 34 (H) 0 - 32 IU/L Final  ?   ?  ?  Passed - HGB in normal range and within 360 days  ?  Hemoglobin  ?Date Value Ref Range Status  ?02/08/2022 14.8 11.1 - 15.9 g/dL Final  ?   ?  ?  Passed - Cr in normal range and within 360 days  ?  Creat  ?Date Value Ref Range Status  ?07/24/2014 0.84 0.50 - 1.10 mg/dL Final  ? ?Creatinine, Ser  ?Date Value Ref Range Status  ?02/08/2022 0.86 0.57 - 1.00 mg/dL Final  ? ?Creatinine, Urine  ?Date Value Ref Range Status  ?11/12/2015 115 20 - 320 mg/dL Final  ?   ?  ?  Passed - HCT in normal range and within 360 days  ?  Hematocrit  ?Date Value Ref Range Status  ?02/08/2022 44.3 34.0 - 46.6 % Final  ?   ?  ?  Passed - AST in normal range and within 360 days  ?  AST  ?Date Value Ref Range Status  ?02/08/2022 19 0 - 40 IU/L Final  ?   ?  ?  Passed - eGFR is 30 or above and within 360 days  ?  GFR calc Af Amer  ?Date Value Ref Range Status  ?08/21/2019 108 >59 mL/min/1.73 Final  ? ?GFR calc non Af Amer  ?Date Value Ref Range Status  ?08/21/2019 93 >59 mL/min/1.73 Final  ? ?eGFR  ?Date Value Ref Range Status  ?02/08/2022 74 >59 mL/min/1.73 Final  ?   ?  ?  Passed - Patient is not pregnant  ?  ?  Passed - Valid encounter within last 12 months  ?  Recent Outpatient Visits   ?      ? 2 months ago Type 2 diabetes mellitus with hyperlipidemia ([place])  ? [place] Karle Plumber B, MD  ? 6 months ago Type 2 diabetes mellitus with cataract [facility])  ? [place] Karle Plumber B, MD  ? 7 months ago COVID-19 virus infection  ? [place] Karle Plumber B, MD  ? 1 year ago Type 2 diabetes mellitus with cataract [facility])  ? [place] Ladell Pier, MD  ? 1 year ago Encounter for Medicare annual wellness exam  ? [place] Ladell Pier, MD  ?  ?  ? ?  ?  ?  ? ? ?

## 2022-05-04 ENCOUNTER — Other Ambulatory Visit: Payer: Self-pay | Admitting: Internal Medicine

## 2022-05-04 DIAGNOSIS — E1136 Type 2 diabetes mellitus with diabetic cataract: Secondary | ICD-10-CM

## 2022-05-04 MED ORDER — BD PEN NEEDLE NANO U/F 32G X 4 MM MISC: 6 refills | Status: DC

## 2022-05-04 NOTE — Telephone Encounter (Signed)
Medication Refill - Medication:  ?Insulin Pen Needle (BD PEN NEEDLE NANO U/F) 32G X 4 MM MISC  ? ?Has the patient contacted their pharmacy? Yes.   ?Contact PCP ? ?Preferred Pharmacy (with phone number or street name):  ?[id number]- [place], [place] - [address] AT [address] ?[id number] ?Phone:  [phone] Fax:  [phone] ? ?Has the patient been seen for an appointment in the last year OR does the patient have an upcoming appointment? Yes.   ? ?Agent: Please be advised that RX refills may take up to 3 business days. We ask that you follow-up with your pharmacy. ?

## 2022-05-04 NOTE — Telephone Encounter (Signed)
Requested Prescriptions  ?Pending Prescriptions Disp Refills  ?? Insulin Pen Needle (BD PEN NEEDLE NANO U/F) 32G X 4 MM MISC 100 each 6  ?  Sig: Use once a day with insulin pen  ?  ? Endocrinology: Diabetes - Testing Supplies Passed - 05/04/2022  3:54 PM  ?  ?  Passed - Valid encounter within last 12 months  ?  Recent Outpatient Visits   ?      ? 2 months ago Type 2 diabetes mellitus with hyperlipidemia ([place])  ? [place] Karle Plumber B, MD  ? 6 months ago Type 2 diabetes mellitus with cataract [facility])  ? [place] Karle Plumber B, MD  ? 8 months ago COVID-19 virus infection  ? [place] Karle Plumber B, MD  ? 1 year ago Type 2 diabetes mellitus with cataract [facility])  ? [place] Ladell Pier, MD  ? 1 year ago Encounter for Medicare annual wellness exam  ? [place] Ladell Pier, MD  ?  ?  ? ?  ?  ?  ? ? ?

## 2022-05-04 NOTE — Telephone Encounter (Signed)
Pt called in to follow up on this Rx refll request. Please advise.  ?

## 2022-05-06 LAB — HM DIABETES EYE EXAM

## 2022-05-26 ENCOUNTER — Other Ambulatory Visit: Payer: Self-pay | Admitting: Internal Medicine

## 2022-05-26 ENCOUNTER — Encounter: Payer: Self-pay | Admitting: Internal Medicine

## 2022-05-26 ENCOUNTER — Ambulatory Visit: Payer: Medicare Other | Attending: Internal Medicine | Admitting: Internal Medicine

## 2022-05-26 VITALS — BP 137/74 | HR 73 | Temp 98.3°F | Resp 12 | Wt 146.0 lb

## 2022-05-26 DIAGNOSIS — M25561 Pain in right knee: Secondary | ICD-10-CM | POA: Diagnosis not present

## 2022-05-26 DIAGNOSIS — G8929 Other chronic pain: Secondary | ICD-10-CM | POA: Diagnosis not present

## 2022-05-26 DIAGNOSIS — E785 Hyperlipidemia, unspecified: Secondary | ICD-10-CM

## 2022-05-26 DIAGNOSIS — F17200 Nicotine dependence, unspecified, uncomplicated: Secondary | ICD-10-CM

## 2022-05-26 DIAGNOSIS — N951 Menopausal and female climacteric states: Secondary | ICD-10-CM

## 2022-05-26 DIAGNOSIS — Z780 Asymptomatic menopausal state: Secondary | ICD-10-CM

## 2022-05-26 DIAGNOSIS — E1169 Type 2 diabetes mellitus with other specified complication: Secondary | ICD-10-CM

## 2022-05-26 DIAGNOSIS — R030 Elevated blood-pressure reading, without diagnosis of hypertension: Secondary | ICD-10-CM

## 2022-05-26 LAB — POCT GLYCOSYLATED HEMOGLOBIN (HGB A1C): HbA1c, POC (controlled diabetic range): 7.4 % — AB (ref 0.0–7.0)

## 2022-05-26 LAB — GLUCOSE, POCT (MANUAL RESULT ENTRY): POC Glucose: 253 mg/dl — AB (ref 70–99)

## 2022-05-26 MED ORDER — TOUJEO SOLOSTAR 300 UNIT/ML ~~LOC~~ SOPN: 33.0000 [IU] | PEN_INJECTOR | Freq: Every day | SUBCUTANEOUS | 2 refills | Status: DC

## 2022-05-26 MED ORDER — DEXCOM G6 RECEIVER DEVI: 1.0000 | Freq: Every day | 0 refills | Status: DC

## 2022-05-26 MED ORDER — LANTUS SOLOSTAR 100 UNIT/ML ~~LOC~~ SOPN: 33.0000 [IU] | PEN_INJECTOR | Freq: Every day | SUBCUTANEOUS | 6 refills | Status: DC

## 2022-05-26 MED ORDER — DEXCOM G6 SENSOR MISC: 1.0000 | Freq: Every day | 11 refills | Status: DC

## 2022-05-26 MED ORDER — EMPAGLIFLOZIN 10 MG PO TABS: 10.0000 mg | ORAL_TABLET | Freq: Every day | ORAL | 3 refills | Status: DC

## 2022-05-26 NOTE — Progress Notes (Signed)
Patient ID: Bailey [name], female    DOB: 01-11-1956  MRN: [id number]  CC: Diabetes, Hypertension, and knee weakness   Subjective: Bailey [name] is a 66 y.o. female who presents for chronic disease management Her concerns today include:  Pt with hx of DM, HL, OA (RT shoulder and knees), Dupuytren's contractures, gastric ulcer, Vit D def, tob dep, thrombocytopenia.  On Gabapentin for hot flashes.   DM Results for orders placed or performed in visit on 05/26/22  Glucose (CBG)  Result Value Ref Range   POC Glucose 253 (A) 70 - 99 mg/dl  HgB A1c  Result Value Ref Range   Hemoglobin A1C     HbA1c POC (<> result, manual entry)     HbA1c, POC (prediabetic range)     HbA1c, POC (controlled diabetic range) 7.4 (A) 0.0 - 7.0 %   Medication: No Jardiance in 2 mths.  Reports pharmacy could not reach [place] for RF.  Compliant with Lantus 33, Amaryl 4 mg daily and Januvia. Reports further low BS after we decrease Amaryl  from 8 to 4 mg.  Readings b/w 75-112.  However, BS in the 70s rare.  Needs RF on Gabapentin  HL:  taking and tolerating Lipitor 40 mg  Elev BP: Blood pressure elevated on last visit.  DASH diet discussed and encouraged. Checking QOD.  Forgot log. Most recent readings that she recalls are 118/70, 120/70s.  Limiting salt in foods  Tob dep:  not ready to quit.  Off Vivelle for quit a while.  Not strong enough. Still get night sweats. Plans to go back to gyn.  Complains of weakness in the right knee.  No pain in the knee.  However when walking down steps she has to rotate her body so that she walks sideways coming down the steps because the right knee feels a bit off.  She has not had any falls.  She noticed some popping in the knee when she walks.  HM:  due for shingrix and urine microalbumin.  [facility] Patient Active Problem List   Diagnosis Date Noted   Thrombocytopenia ([place]) 04/12/2021   Tobacco dependence 10/05/2017   Spinal  stenosis of lumbar region with neurogenic claudication 10/05/2017   Osteoarthritis of spine with radiculopathy, lumbar region 10/05/2017   Plantar wart of right foot 06/01/2017   Right sided weakness 12/24/2015   Dyslipidemia with low high density lipoprotein (HDL) cholesterol with hypertriglyceridemia due to type 2 diabetes mellitus ([place]) 11/16/2015   Uncontrolled type 2 diabetes mellitus with hyperglycemia ([place]) 11/12/2015   Menopausal symptoms 11/12/2015   Dupuytren's contracture of both hands 07/05/2013   Need for Tdap vaccination 07/05/2013   Arthritis      Current Outpatient Medications on File Prior to Visit  Medication Sig Dispense Refill   atorvastatin (LIPITOR) 40 MG tablet Take 1 tablet (40 mg total) by mouth daily. 90 tablet 3   gabapentin (NEURONTIN) 300 MG capsule TAKE 1 CAPSULE(300 MG) BY MOUTH AT BEDTIME 90 capsule 3   glimepiride (AMARYL) 4 MG tablet Take 1 tablet (4 mg total) by mouth daily with breakfast. 90 tablet 3   glucose blood (ONETOUCH VERIO) test strip Use as instructed to check blood sugar daily. 100 each 11   Insulin Pen Needle (BD PEN NEEDLE NANO U/F) 32G X 4 MM MISC Use once a day with insulin pen 100 each 6   Lancet Devices (ACCU-CHEK SOFTCLIX) lancets Use as instructed 1 each 0   losartan (  COZAAR) 25 MG tablet Take 25 mg by mouth daily.     meloxicam (MOBIC) 15 MG tablet TAKE 1 TABLET (15 MG TOTAL) BY MOUTH DAILY. 90 tablet 2   Multiple Vitamins-Minerals (MULTIVITAMIN WITH MINERALS) tablet Take 1 tablet by mouth every other day.      OneTouch Delica Lancets 27C MISC Use to check blood sugar daily. 100 each 11   sitaGLIPtin (JANUVIA) 100 MG tablet TAKE 1 TABLET(100 MG) BY MOUTH DAILY 90 tablet 3   Blood Glucose Monitoring Suppl (ONETOUCH VERIO) w/Device KIT Use to check blood sugar daily. 1 kit 0   Cholecalciferol (VITAMIN D3) 2000 units TABS Take 2,000 Units by mouth daily. 30 tablet 11   Current Facility-Administered Medications on File Prior to Visit   Medication Dose Route Frequency Provider Last Rate Last Admin   0.9 %  sodium chloride infusion  500 mL Intravenous Once Nandigam, Venia Minks, MD        Allergies  Allergen Reactions   Metformin And Related Diarrhea   Shellfish Allergy Anaphylaxis, Hives and Swelling    Lobster, crab (shrimp's ok)   Ivp Dye [Iodinated Contrast Media] Hives   Adhesive [Tape] Other (See Comments)    blisters   Chantix [Varenicline] Other (See Comments)    depression   Codeine Other (See Comments)    Unknown childhood allergy   Hydrocodone-Acetaminophen    Latex Other (See Comments)    blisters   Penicillins Other (See Comments)    Unknown childhood reaction    Social History   Socioeconomic History   Marital status: Married    Spouse name: Bailey [name]   Number of children: 0   Years of education: 15   Highest education level: Not on file  Occupational History   Occupation: Dispensing optician: [employer]     Comment: Civil engineer, contracting  Tobacco Use   Smoking status: Every Day    Packs/day: 0.25    Years: 28.00    Pack years: 7.00    Types: Cigarettes   Smokeless tobacco: Never  Vaping Use   Vaping Use: Never used  Substance and Sexual Activity   Alcohol use: No    Alcohol/week: 0.0 standard drinks   Drug use: No   Sexual activity: Yes    Partners: Male    Birth control/protection: Surgical  Other Topics Concern   Not on file  Social History Narrative   Currently living alone, separated from husband.  One story home.  [name]'s children are adults-2 live in [place], 2 live in [place].     Works for a [employer].     Education: 2 years of college.   Social Determinants of Health   Financial Resource Strain: Not on file  Food Insecurity: Not on file  Transportation Needs: Not on file  Physical Activity: Not on file  Stress: Not on file  Social Connections: Not on file  Intimate Partner Violence: Not on file    Family History  Problem Relation Age of  Onset   Diabetes Mother    Hypertension Mother    Dementia Mother    Pancreatitis Mother    Asthma Mother    COPD Mother    Stroke Mother    Hyperlipidemia Mother    Heart disease Mother    Hypertension Father    Hypertension Sister    Diabetes Sister    Hyperlipidemia Sister    Hypertension Brother    Diabetes Brother    Drug abuse Brother  Cancer Brother        colon   Hyperlipidemia Brother    Colon cancer Brother 79   Hyperlipidemia Brother    Hypertension Brother    Hyperlipidemia Sister    Hypertension Sister    Diabetes Sister    Diabetes Maternal Grandmother    Colon cancer Maternal Grandmother    Breast cancer Neg Hx    Esophageal cancer Neg Hx    Stomach cancer Neg Hx     Past Surgical History:  Procedure Laterality Date   COLONOSCOPY  last one 07-15-2016   ESOPHAGOGASTRODUODENOSCOPY  last one 2008   FASCIECTOMY Left 08/24/2016   Procedure: LEFT HAND PALMER AND SMALL FINGER DIGiTAL FASCIECTOMY AND RELEASE;  Surgeon: Iran Planas, MD;  Location: [facility];  Service: Orthopedics;  Laterality: Left;   LAPAROSCOPIC CHOLECYSTECTOMY  07/17/2000   ROTATOR CUFF REPAIR Right 2000   TOTAL ABDOMINAL HYSTERECTOMY W/ BILATERAL SALPINGOOPHORECTOMY  1998    ROS: Review of Systems Negative except as stated above  PHYSICAL EXAM: BP 137/74 (BP Location: Right Arm, Patient Position: Sitting, Cuff Size: Normal)   Pulse 73   Temp 98.3 F (36.8 C) (Oral)   Resp 12   Wt 146 lb (66.2 kg)   SpO2 97%   BMI 26.70 kg/m   Physical Exam Repeat blood pressure: 140/80 left, 150/70 right General appearance - alert, well appearing, and in no distress Mental status - alert, oriented to person, place, and time Neck - supple, no significant adenopathy Chest - clear to auscultation, no wheezes, rales or rhonchi, symmetric air entry Heart - normal rate, regular rhythm, normal S1, S2, no murmurs, rubs, clicks or gallops Musculoskeletal -right knee: No edema or  erythema.  No point tenderness.  Mild popping heard and felt with passive range of motion. Extremities - peripheral pulses normal, no pedal edema, no clubbing or cyanosis      Latest Ref Rng & Units 02/08/2022    9:42 AM 04/12/2021   11:31 AM 08/21/2019    3:10 PM  CMP  Glucose 70 - 99 mg/dL 117   148   365    BUN 8 - 27 mg/dL '11   10   11    ' Creatinine 0.57 - 1.00 mg/dL 0.86   0.81   0.65    Sodium 134 - 144 mmol/L 146   145   138    Potassium 3.5 - 5.2 mmol/L 4.0   3.9   4.3    Chloride 96 - 106 mmol/L 109   106   100    CO2 20 - 29 mmol/L '25   24   21    ' Calcium 8.7 - 10.3 mg/dL 9.5   9.7   9.2    Total Protein 6.0 - 8.5 g/dL 7.0   7.1   6.8    Total Bilirubin 0.0 - 1.2 mg/dL 0.4   0.3   0.2    Alkaline Phos 44 - 121 IU/L 74   82   103    AST 0 - 40 IU/L '19   19   18    ' ALT 0 - 32 IU/L 34   29   20     Lipid Panel     Component Value Date/Time   CHOL 97 (L) 02/08/2022 0942   TRIG 88 02/08/2022 0942   HDL 39 (L) 02/08/2022 0942   CHOLHDL 2.5 02/08/2022 0942   CHOLHDL 7.0 (H) 11/12/2015 1153   VLDL NOT CALC 11/12/2015 1153  LDLCALC 41 02/08/2022 0942   LDLDIRECT 45 06/10/2016 1213    CBC    Component Value Date/Time   WBC 7.8 02/08/2022 0942   WBC 7.6 08/22/2015 1009   RBC 4.65 02/08/2022 0942   RBC 4.66 08/22/2015 1009   HGB 14.8 02/08/2022 0942   HCT 44.3 02/08/2022 0942   PLT 118 (L) 02/08/2022 0942   MCV 95 02/08/2022 0942   MCH 31.8 02/08/2022 0942   MCH 31.1 08/22/2015 1009   MCHC 33.4 02/08/2022 0942   MCHC 33.6 08/22/2015 1009   RDW 13.5 02/08/2022 0942   LYMPHSABS 2.4 08/22/2015 1009   MONOABS 0.7 08/22/2015 1009   EOSABS 0.1 08/22/2015 1009   BASOSABS 0.0 08/22/2015 1009    ASSESSMENT AND PLAN:  1. Type 2 diabetes mellitus with hyperlipidemia ([place]) Close to goal. She has been out of Jardiance.  Refills sent.  Because she is still having some blood sugars in the 70s, I recommend decreasing Amaryl further to 2 mg. Encouraged to continue trying to  eat healthy and move as much as she can. - Glucose (CBG) - HgB A1c - empagliflozin (JARDIANCE) 10 MG TABS tablet; Take 1 tablet (10 mg total) by mouth daily.  Dispense: 90 tablet; Refill: 3 - insulin glargine (LANTUS SOLOSTAR) 100 UNIT/ML Solostar Pen; Inject 33 Units into the skin at bedtime.  Dispense: 45 mL; Refill: 6 - Microalbumin / creatinine urine ratio - Continuous Blood Gluc Receiver (DEXCOM G6 RECEIVER) DEVI; 1 Device by Does not apply route daily.  Dispense: 1 each; Refill: 0 - Continuous Blood Gluc Sensor (DEXCOM G6 SENSOR) MISC; 1 packet by Does not apply route daily.  Dispense: 3 each; Refill: 11  2. Tobacco dependence Advised to quit.  Patient not ready to give a trial of quitting.  3. Elevated blood-pressure reading without diagnosis of hypertension Home blood pressure readings are good.  However blood pressure elevated again today in the office.  I have asked that she continues to monitor her blood pressure at home and bring logbook with her on next visit.  I have also requested that she brings her home monitoring device with her so that we can check her blood pressure on our device and hers.  4. Chronic pain of right knee Questionable etiology.  I recommend referral to orthopedics.  Patient wants to hold off at this time.  Advised that she hold onto rails when walking down or going up steps.  5. Hot flash, menopausal Patient states she plans to follow-up with gynecology  6. Postmenopausal estrogen deficiency Agreeable to having bone density study for osteoporosis screening.  Last study was in 2015. - DG Bone Density; Future    Patient was given the opportunity to ask questions.  Patient verbalized understanding of the plan and was able to repeat key elements of the plan.   This documentation was completed using Radio producer.  Any transcriptional errors are unintentional.  Orders Placed This Encounter  Procedures   DG Bone Density    Microalbumin / creatinine urine ratio   Glucose (CBG)   HgB A1c     Requested Prescriptions   Signed Prescriptions Disp Refills   empagliflozin (JARDIANCE) 10 MG TABS tablet 90 tablet 3    Sig: Take 1 tablet (10 mg total) by mouth daily.   insulin glargine (LANTUS SOLOSTAR) 100 UNIT/ML Solostar Pen 45 mL 6    Sig: Inject 33 Units into the skin at bedtime.   Continuous Blood Gluc Receiver ([place]) DEVI 1  each 0    Sig: 1 Device by Does not apply route daily.   Continuous Blood Gluc Sensor (DEXCOM G6 SENSOR) MISC 3 each 11    Sig: 1 packet by Does not apply route daily.    Return in about 1 month (around 06/25/2022) for Medicare Wellness Visit.  Karle Plumber, MD, FACP

## 2022-05-26 NOTE — Progress Notes (Signed)
No jardiance in 2 mos Vivelle dot did not help with night sweats  Discuss dexcom device options  Right knee weakness  CBG- 253 A1C-

## 2022-05-26 NOTE — Patient Instructions (Signed)
Please keep a log of your blood pressure readings and bring them with you on your next visit.  Please bring your blood pressure device with you on your next visit.  Decrease glimepiride from 4 mg daily to 2 mg daily.

## 2022-05-27 ENCOUNTER — Telehealth: Payer: Self-pay | Admitting: Internal Medicine

## 2022-05-27 DIAGNOSIS — E1169 Type 2 diabetes mellitus with other specified complication: Secondary | ICD-10-CM | POA: Diagnosis not present

## 2022-05-27 DIAGNOSIS — E785 Hyperlipidemia, unspecified: Secondary | ICD-10-CM | POA: Diagnosis not present

## 2022-05-27 NOTE — Telephone Encounter (Signed)
Pt called in for assistance. Pt says that she went to the pharmacy for pick up and realized that she received a different insulin. Pt says that she usually take Lancets but received insulin glargine, 1 Unit Dial, (TOUJEO SOLOSTAR) 300 UNIT/ML Solostar Pen instead. Pt says that she doesn't recall being told about a medication change.      ALSO, pt says that she received a new sensor and receiver. Pt would like assistance with learning how to use this.    Please advise

## 2022-05-29 LAB — MICROALBUMIN / CREATININE URINE RATIO
Creatinine, Urine: 144.8 mg/dL
Microalb/Creat Ratio: 10 mg/g creat (ref 0–29)
Microalbumin, Urine: 14.4 ug/mL

## 2022-05-30 NOTE — Telephone Encounter (Signed)
Patient was called and a VM was left informing her to return phone call.  Insulin was changed due to cost.

## 2022-06-06 ENCOUNTER — Telehealth: Payer: Self-pay | Admitting: Internal Medicine

## 2022-06-06 ENCOUNTER — Other Ambulatory Visit: Payer: Self-pay | Admitting: Internal Medicine

## 2022-06-06 NOTE — Telephone Encounter (Signed)
Medication Refill - Medication: gabapentin (NEURONTIN) 300 MG capsule   Has the patient contacted their pharmacy? no (Agent: If no, request that the patient contact the pharmacy for the refill. If patient does not wish to contact the pharmacy document the reason why and proceed with request.) (Agent: If yes, when and what did the pharmacy advise?)pt says every time she calls pharmacy she has to end up calling back to [place]  Preferred Pharmacy (with phone number or street name[id number] [place]  [phone] Fax:  [phone]    Has the patient been seen for an appointment in the last year OR does the patient have an upcoming appointment? yes  Agent: Please be advised that RX refills may take up to 3 business days. We ask that you follow-up with your pharmacy.

## 2022-06-06 NOTE — Telephone Encounter (Signed)
Copied from [place] [id number]. Topic: General - Other >> Jun 06, 2022 12:00 PM [place] B wrote: Reason for CRM:opt called in requesting when she can dc on g6 program. Please call back

## 2022-06-07 MED ORDER — GABAPENTIN 300 MG PO CAPS: ORAL_CAPSULE | ORAL | 1 refills | Status: DC

## 2022-06-07 NOTE — Telephone Encounter (Signed)
Requested Prescriptions  Pending Prescriptions Disp Refills  . gabapentin (NEURONTIN) 300 MG capsule 90 capsule 3    Sig: TAKE 1 CAPSULE(300 MG) BY MOUTH AT BEDTIME     Neurology: Anticonvulsants - gabapentin Passed - 06/06/2022  4:19 PM      Passed - Cr in normal range and within 360 days    Creat  Date Value Ref Range Status  07/24/2014 0.84 0.50 - 1.10 mg/dL Final   Creatinine, Ser  Date Value Ref Range Status  02/08/2022 0.86 0.57 - 1.00 mg/dL Final   Creatinine, Urine  Date Value Ref Range Status  11/12/2015 115 20 - 320 mg/dL Final         Passed - Completed PHQ-2 or PHQ-9 in the last 360 days      Passed - Valid encounter within last 12 months    Recent Outpatient Visits          1 week ago Type 2 diabetes mellitus with hyperlipidemia [facility])   [place] Karle Plumber B, MD   3 months ago Type 2 diabetes mellitus with hyperlipidemia [facility])   [place] Karle Plumber B, MD   8 months ago Type 2 diabetes mellitus with cataract [facility])   [place] Ladell Pier, MD   9 months ago COVID-19 virus infection   [place] Ladell Pier, MD   1 year ago Type 2 diabetes mellitus with cataract [facility])   [place] [facility] Ladell Pier, MD

## 2022-06-15 ENCOUNTER — Ambulatory Visit
Admission: RE | Admit: 2022-06-15 | Discharge: 2022-06-15 | Disposition: A | Discharge status: Home / Self Care | Payer: Medicare Other | Source: Ambulatory Visit | Attending: Internal Medicine | Admitting: Internal Medicine

## 2022-06-15 DIAGNOSIS — Z780 Asymptomatic menopausal state: Secondary | ICD-10-CM | POA: Diagnosis not present

## 2022-06-15 DIAGNOSIS — M85852 Other specified disorders of bone density and structure, left thigh: Secondary | ICD-10-CM | POA: Diagnosis not present

## 2022-06-22 ENCOUNTER — Other Ambulatory Visit: Payer: Self-pay | Admitting: Pharmacist

## 2022-06-22 MED ORDER — DEXCOM G6 TRANSMITTER MISC: 1 refills | Status: DC

## 2022-07-05 ENCOUNTER — Ambulatory Visit: Payer: Medicare Other | Attending: Internal Medicine | Admitting: Internal Medicine

## 2022-07-05 ENCOUNTER — Ambulatory Visit: Payer: Medicare Other | Attending: Internal Medicine | Admitting: Pharmacist

## 2022-07-05 ENCOUNTER — Encounter: Payer: Self-pay | Admitting: Internal Medicine

## 2022-07-05 VITALS — BP 132/76 | HR 64 | Temp 98.1°F | Resp 16 | Ht 62.25 in | Wt 146.0 lb

## 2022-07-05 DIAGNOSIS — Z0000 Encounter for general adult medical examination without abnormal findings: Secondary | ICD-10-CM

## 2022-07-05 DIAGNOSIS — Z23 Encounter for immunization: Secondary | ICD-10-CM

## 2022-07-05 DIAGNOSIS — F17200 Nicotine dependence, unspecified, uncomplicated: Secondary | ICD-10-CM | POA: Diagnosis not present

## 2022-07-05 DIAGNOSIS — Z79899 Other long term (current) drug therapy: Secondary | ICD-10-CM

## 2022-07-05 NOTE — Progress Notes (Signed)
Patient was educated on the use of the Dexcom blood glucose meter. Reviewed necessary supplies and operation of the meter. Also reviewed goal blood glucose levels. Patient was able to demonstrate use. We placed her first sensor and transmitter for her today. All questions and concerns were addressed.  Benard Halsted, PharmD, [place], [place] [phone]

## 2022-07-05 NOTE — Progress Notes (Signed)
Subjective:   Bailey [name] is a 66 y.o. female who presents for Medicare Annual (Subsequent) preventive examination. Pt with hx of DM, HL, OA (RT shoulder and knees), Dupuytren's contractures, gastric ulcer, Vit D def, tob dep, thrombocytopenia.  On Gabapentin for hot flashes.    Review of Systems           Objective:    Today's Vitals   07/05/22 1008 07/05/22 1013  BP: 132/76   Pulse: 64   Resp: 16   Temp: 98.1 F (36.7 C)   TempSrc: Oral   SpO2: 100%   Weight: 146 lb (66.2 kg)   Height: 5' 2.25" (1.581 m)   PainSc: 0-No pain 0-No pain   Body mass index is 26.49 kg/m.     07/05/2022   10:15 AM 03/02/2021    3:29 PM 06/22/2018   10:33 AM 10/05/2017    9:01 AM 08/08/2017    2:43 PM 07/27/2017    4:38 PM 07/04/2017    9:03 AM  Advanced Directives  Does Patient Have a Medical Advance Directive? No;Yes No No No No No No  Does patient want to make changes to medical advance directive? No - Patient declined        Would patient like information on creating a medical advance directive? No - Patient declined Yes (Inpatient - patient defers creating a medical advance directive at this time - Information given)  No - Patient declined No - Patient declined No - Patient declined   Advised to bring a copy of Living Willing to next visit  Current Medications (verified) Outpatient Encounter Medications as of 07/05/2022  Medication Sig   atorvastatin (LIPITOR) 40 MG tablet Take 1 tablet (40 mg total) by mouth daily.   Blood Glucose Monitoring Suppl (ONETOUCH VERIO) w/Device KIT Use to check blood sugar daily.   Continuous Blood Gluc Receiver (DEXCOM G6 RECEIVER) DEVI 1 Device by Does not apply route daily.   Continuous Blood Gluc Sensor (DEXCOM G6 SENSOR) MISC 1 packet by Does not apply route daily.   Continuous Blood Gluc Transmit (DEXCOM G6 TRANSMITTER) MISC Use to check blood sugar three times daily. Change transmitter once every 90 days.   empagliflozin (JARDIANCE) 10  MG TABS tablet Take 1 tablet (10 mg total) by mouth daily.   gabapentin (NEURONTIN) 300 MG capsule TAKE 1 CAPSULE(300 MG) BY MOUTH AT BEDTIME   glimepiride (AMARYL) 4 MG tablet Take 1 tablet (4 mg total) by mouth daily with breakfast.   glucose blood (ONETOUCH VERIO) test strip Use as instructed to check blood sugar daily.   Insulin Pen Needle (BD PEN NEEDLE NANO U/F) 32G X 4 MM MISC Use once a day with insulin pen   Lancet Devices (ACCU-CHEK SOFTCLIX) lancets Use as instructed   losartan (COZAAR) 25 MG tablet Take 25 mg by mouth daily.   meloxicam (MOBIC) 15 MG tablet TAKE 1 TABLET (15 MG TOTAL) BY MOUTH DAILY.   Multiple Vitamins-Minerals (MULTIVITAMIN WITH MINERALS) tablet Take 1 tablet by mouth every other day.    OneTouch Delica Lancets 91M MISC Use to check blood sugar daily.   sitaGLIPtin (JANUVIA) 100 MG tablet TAKE 1 TABLET(100 MG) BY MOUTH DAILY   Cholecalciferol (VITAMIN D3) 2000 units TABS Take 2,000 Units by mouth daily.   insulin glargine, 1 Unit Dial, (TOUJEO SOLOSTAR) 300 UNIT/ML Solostar Pen Inject 33 Units into the skin daily. (Patient not taking: Reported on 07/05/2022)   Facility-Administered Encounter Medications as of 07/05/2022  Medication   0.9 %  sodium chloride infusion    Allergies (verified) Metformin and related, Shellfish allergy, Ivp dye [iodinated contrast media], Adhesive [tape], Chantix [varenicline], Codeine, Hydrocodone-acetaminophen, Latex, and Penicillins   History: Past Medical History:  Diagnosis Date   Arthritis    RIGHT shoulder, knees   Cervical radiculopathy at C5    Dupuytren's contracture of both hands    L > R   Dyslipidemia    History of adenomatous polyp of colon    1997 and 2003     History of chronic pelvic inflammatory disease    History of gastric ulcer    2008   Hypertriglyceridemia    Right carpal tunnel syndrome    Type 2 diabetes mellitus, uncontrolled    last A1c 8.9 on 07-07-2016   Vitamin D deficiency    Past Surgical  History:  Procedure Laterality Date   COLONOSCOPY  last one 07-15-2016   ESOPHAGOGASTRODUODENOSCOPY  last one 2008   FASCIECTOMY Left 08/24/2016   Procedure: LEFT HAND PALMER AND SMALL FINGER DIGiTAL FASCIECTOMY AND RELEASE;  Surgeon: Iran Planas, MD;  Location: [facility];  Service: Orthopedics;  Laterality: Left;   LAPAROSCOPIC CHOLECYSTECTOMY  07/17/2000   ROTATOR CUFF REPAIR Right 2000   TOTAL ABDOMINAL HYSTERECTOMY W/ BILATERAL SALPINGOOPHORECTOMY  1998   Family History  Problem Relation Age of Onset   Diabetes Mother    Hypertension Mother    Dementia Mother    Pancreatitis Mother    Asthma Mother    COPD Mother    Stroke Mother    Hyperlipidemia Mother    Heart disease Mother    Hypertension Father    Hypertension Sister    Diabetes Sister    Hyperlipidemia Sister    Hypertension Brother    Diabetes Brother    Drug abuse Brother    Cancer Brother        colon   Hyperlipidemia Brother    Colon cancer Brother 34   Hyperlipidemia Brother    Hypertension Brother    Hyperlipidemia Sister    Hypertension Sister    Diabetes Sister    Diabetes Maternal Grandmother    Colon cancer Maternal Grandmother    Breast cancer Neg Hx    Esophageal cancer Neg Hx    Stomach cancer Neg Hx    Social History   Socioeconomic History   Marital status: Married    Spouse name: Louie Casa   Number of children: 0   Years of education: 15   Highest education level: Not on file  Occupational History   Occupation: Dispensing optician: [employer]     Comment: Civil engineer, contracting  Tobacco Use   Smoking status: Every Day    Packs/day: 0.25    Years: 28.00    Total pack years: 7.00    Types: Cigarettes   Smokeless tobacco: Never  Vaping Use   Vaping Use: Never used  Substance and Sexual Activity   Alcohol use: No    Alcohol/week: 0.0 standard drinks of alcohol   Drug use: No   Sexual activity: Yes    Partners: Male    Birth control/protection:  Surgical  Other Topics Concern   Not on file  Social History Narrative   Currently living alone, separated from husband.  One story home.  [name]'s children are adults-2 live in [place], 2 live in [place].     Works for a [employer].     Education: 2 years of college.   Social Determinants of Health  Financial Resource Strain: Not on file  Food Insecurity: Not on file  Transportation Needs: Not on file  Physical Activity: Not on file  Stress: Not on file  Social Connections: Not on file    Tobacco Counseling -smoking 5-6 cigarettes a day.  "I't hard to quit." She called [employer] and got the lozengers.  These made her gag.  She purchased a supplement leaf product  called "On" that contains 4 mg of coffee.  She just started using so not able to tell as yet if it helps.   Does not drink alcoholic beverages or street drugs  Clinical Intake:  Pain : No/denies pain Pain Score: 0-No pain     Diabetic? Has Dexcom device with her today.  Needs help of our clinical pharmacisit with setting it up   Activities of Daily Living     No data to display          Patient Care Team: Ladell Pier, MD as PCP - General (Internal Medicine)  Indicate any recent Medical Services you may have received from other than [facility] providers in the past year (date may be approximate).     Assessment:   This is a routine wellness examination for Talishia.  Hearing/Vision screen Up to date with eye exam Whisper test normal  Dietary issues and exercise activities discussed: Doing well with eating habits - getting in fruits and veggies daily.  Avoids sugary drinks.  Bake meats. She walks 3-4x/wk for 15 mins   Goals Addressed             This Visit's Progress    Quit Smoking       Hopefully quit smoking by end of the year.     Set My Weight Loss Goal       Follow Up Date 11/19/2022    - set weight loss goal Wants to lose 10 more lbs    Why is this important?    Losing only 5 to 15 percent of your weight makes a big difference in your health.    Notes:       Depression Screen    02/08/2022    8:48 AM 04/12/2021   10:17 AM 03/02/2021    3:27 PM 12/13/2019    8:56 AM 07/06/2018    8:45 AM 04/09/2018    9:47 AM 01/04/2018    8:27 AM  PHQ 2/9 Scores  PHQ - 2 Score 0 0 0 0 2 0 1  PHQ- 9 Score     5      Fall Risk    07/05/2022   10:16 AM 02/08/2022    8:48 AM 04/12/2021   10:17 AM 03/02/2021    3:27 PM 10/03/2019    2:47 PM  Fall Risk   Falls in the past year? 0 0 0 0 0  Number falls in past yr: 0 0 0 0 0  Injury with Fall? 0 0 0 0 0  Risk for fall due to : No Fall Risks No Fall Risks       FALL RISK PREVENTION PERTAINING TO THE HOME:  Any stairs in or around the home? Yes  If so, are there any without handrails? Yes  3 steps leading into house Home free of loose throw rugs in walkways, pet beds, electrical cords, etc? Yes  Adequate lighting in your home to reduce risk of falls? Yes   ASSISTIVE DEVICES UTILIZED TO PREVENT FALLS:  Life alert? No  Use of a cane,  walker or w/c? No  Grab bars in the bathroom? No  Shower chair or bench in shower? No  Elevated toilet seat or a handicapped toilet? No  She does not feel she needs any of the above as of yet.  TIMED UP AND GO:  Was the test performed? Yes .  Length of time to ambulate 10 feet: 8 sec.   Gait steady and fast without use of assistive device  Cognitive Function:    07/05/2022   10:16 AM 03/02/2021    3:27 PM  MMSE - Mini Mental State Exam  Orientation to time 5 5  Orientation to Place 5 5  Registration 3 3  Attention/ Calculation 5 5  Recall 3 3  Language- name 2 objects 2 2  Language- repeat 1 1  Language- follow 3 step command 3 3  Language- read & follow direction 1 1  Write a sentence 1 1  Copy design 1 1  Total score 30 30        07/05/2022   10:20 AM  6CIT Screen  What Year? 0 points  What month? 0 points  What time? 0 points  Count back from 20 0  points  Months in reverse 0 points  Repeat phrase 0 points  Total Score 0 points    Immunizations Immunization History  Administered Date(s) Administered   Influenza, Seasonal, Injecte, Preservative Fre 01/04/2013   Influenza,inj,Quad PF,6+ Mos 12/05/2016, 09/17/2019   Influenza-Unspecified 09/14/2017, 09/25/2020, 10/17/2021   PFIZER(Purple Top)SARS-COV-2 Vaccination 03/26/2020, 04/20/2020, 12/15/2020   PPD Test 04/10/2018   Pneumococcal Conjugate-13 12/13/2019   Pneumococcal Polysaccharide-23 12/05/2016, 02/08/2022   Tdap 02/19/2018    TDAP status: Up to date  Flu Vaccine status: Up to date  Pneumococcal vaccine status: Up to date  Covid-19 vaccine status: Information provided on how to obtain vaccines.   Qualifies for Shingles Vaccine? Yes   Zostavax completed No   Shingrix Completed?: No.    Education has been provided regarding the importance of this vaccine. Patient has been advised to call insurance company to determine out of pocket expense if they have not yet received this vaccine. Advised may also receive vaccine at local pharmacy or Health Dept. Verbalized acceptance and understanding.  Screening Tests Health Maintenance  Topic Date Due   Zoster Vaccines- Shingrix (1 of 2) Never done   COVID-19 Vaccine (4 - Pfizer series) 02/09/2021   INFLUENZA VACCINE  07/26/2022   HEMOGLOBIN A1C  11/25/2022   FOOT EXAM  02/08/2023   OPHTHALMOLOGY EXAM  05/07/2023   COLONOSCOPY (Pts 45-23yr Insurance coverage will need to be confirmed)  06/23/2023   MAMMOGRAM  10/27/2023   TETANUS/TDAP  02/20/2028   Pneumonia Vaccine 66 Years old  Completed   DEXA SCAN  Completed   Hepatitis C Screening  Completed   HPV VACCINES  Aged Out    Health Maintenance  Health Maintenance Due  Topic Date Due   Zoster Vaccines- Shingrix (1 of 2) Never done   COVID-19 Vaccine (4 - Pfizer series) 02/09/2021    Colorectal cancer screening: Type of screening: Colonoscopy. Completed 06/22/2018.  Repeat every 5 years  Mammogram status: Completed 10/26/2021. Repeat every year  Bone Density status: Completed 06/15/2022. Results reflect: Bone density results: OSTEOPENIA. Repeat every 2-5 years.  Lung Cancer Screening: (Low Dose CT Chest recommended if Age 68-80years, 30 pack-year currently smoking OR have quit w/in 15years.) does not qualify.  Smoked for 35 yrs.  When she was at her heaviest she was smoking 1/2  pk a day Lung Cancer Screening Referral: NA but encouraged to quit smoking  Additional Screening:  Hepatitis C Screening: does qualify; Completed 06/2013  Vision Screening: Recommended annual ophthalmology exams for early detection of glaucoma and other disorders of the eye. Is the patient up to date with their annual eye exam?  Yes  Who is the provider or what is the name of the office in which the patient attends annual eye exams? Dr. Satira Sark If pt is not established with a provider, would they like to be referred to a provider to establish care?  NA .   Dental Screening: Recommended annual dental exams for proper oral hygiene.  Plans to get in with a dentist soon  Community Resource Referral / Chronic Care Management: CRR required this visit?  No   CCM required this visit?  No      Plan:    1. Encounter for Medicare annual wellness exam 2. Tobacco dependence 3. Need for shingles vaccine 4. Need for COVID-19 vaccine  Instructions given to pt: Please consider giving [place] a copy of your medical living will or medical healthcare power of attorney document. Advised against frequent use of the supplement that you have purchased to help with smoking cessation as it contains increased dose of caffeine. Consider calling 1 800 quit now again and request the nicotine patches instead. Please remember to get the Shingrix vaccine from your local pharmacy.  Have them send me a message once you get your first shot.  Consider getting your fourth COVID 19 vaccine at your local  pharmacy.  Our clinical pharmacist will meet with you today to help you set up your Dexcom device.  I have personally reviewed and noted the following in the patient's chart:   Medical and social history Use of alcohol, tobacco or illicit drugs  Current medications and supplements including opioid prescriptions.  Functional ability and status Nutritional status Physical activity Advanced directives List of other physicians Hospitalizations, surgeries, and ER visits in previous 12 months Vitals Screenings to include cognitive, depression, and falls Referrals and appointments  In addition, I have reviewed and discussed with patient certain preventive protocols, quality metrics, and best practice recommendations. A written personalized care plan for preventive services as well as general preventive health recommendations were provided to patient.     Karle Plumber, MD   07/05/2022

## 2022-07-05 NOTE — Patient Instructions (Addendum)
Please consider giving [place] a copy of your medical living will or medical healthcare power of attorney document. Advised against frequent use of the supplement that you have purchased to help with smoking cessation as it contains increased dose of caffeine. Consider calling 1 800 quit now again and request the nicotine patches instead. Please remember to get the Shingrix vaccine from your local pharmacy.  Have them send me a message once you get your first shot.  Consider getting your fourth COVID 19 vaccine at your local pharmacy.  Our clinical pharmacist will meet with you today to help you set up your Dexcom device.

## 2022-09-01 ENCOUNTER — Other Ambulatory Visit: Payer: Self-pay | Admitting: Internal Medicine

## 2022-09-01 DIAGNOSIS — E1169 Type 2 diabetes mellitus with other specified complication: Secondary | ICD-10-CM

## 2022-09-01 DIAGNOSIS — E1136 Type 2 diabetes mellitus with diabetic cataract: Secondary | ICD-10-CM

## 2022-09-01 NOTE — Telephone Encounter (Signed)
Medication Refill - Medication: atorvastatin (LIPITOR) 40 MG tablet    glimepiride (AMARYL) 4 MG tablet   sitaGLIPtin (JANUVIA) 100 MG tablet   Has the patient contacted their pharmacy? Yes.   (Agent: If no, request that the patient contact the pharmacy for the refill. If patient does not wish to contact the pharmacy document the reason why and proceed with request.) (Agent: If yes, when and what did the pharmacy advise?) Pharmacy has been requesting several times with no response   Preferred Pharmacy (with phone number or street name): [id number] [place] [address], [id number] Phone:  [phone] Fax:  [phone] DEA #:  [id number] the patient been seen for an appointment in the last year OR does the patient have an upcoming appointment? Yes.    Agent: Please be advised that RX refills may take up to 3 business days. We ask that you follow-up with your pharmacy.

## 2022-09-02 MED ORDER — ATORVASTATIN CALCIUM 40 MG PO TABS: 40.0000 mg | ORAL_TABLET | Freq: Every day | ORAL | 0 refills | Status: DC

## 2022-09-02 MED ORDER — SITAGLIPTIN PHOSPHATE 100 MG PO TABS: ORAL_TABLET | ORAL | 0 refills | Status: DC

## 2022-09-02 NOTE — Telephone Encounter (Signed)
Requested Prescriptions  Pending Prescriptions Disp Refills  . atorvastatin (LIPITOR) 40 MG tablet 90 tablet 0    Sig: Take 1 tablet (40 mg total) by mouth daily.     Cardiovascular:  Antilipid - Statins Failed - 09/01/2022  3:18 PM      Failed - Lipid Panel in normal range within the last 12 months    Cholesterol, Total  Date Value Ref Range Status  02/08/2022 97 (L) 100 - 199 mg/dL Final   LDL Chol Calc (NIH)  Date Value Ref Range Status  02/08/2022 41 0 - 99 mg/dL Final   Direct LDL  Date Value Ref Range Status  06/10/2016 45 <130 mg/dL Final    Comment:      Desirable range <100 mg/dL for patients with CHD or diabetes and <70 mg/dL for diabetic patients with known heart disease.      HDL  Date Value Ref Range Status  02/08/2022 39 (L) >39 mg/dL Final   Triglycerides  Date Value Ref Range Status  02/08/2022 88 0 - 149 mg/dL Final         Passed - Patient is not pregnant      Passed - Valid encounter within last 12 months    Recent Outpatient Visits          1 month ago Medication management   [place], RPH-CPP   1 month ago Encounter for [employer] annual wellness exam   [place] Karle Plumber B, MD   3 months ago Type 2 diabetes mellitus with hyperlipidemia [facility])   [place], MD   6 months ago Type 2 diabetes mellitus with hyperlipidemia [facility])   [place], MD   11 months ago Type 2 diabetes mellitus with cataract [facility])   [place], MD      Future Appointments            In 2 months Wynetta Emery Dalbert Batman, MD [place]           . glimepiride (AMARYL) 4 MG tablet 90 tablet     Sig: Take 1 tablet (4 mg total) by mouth daily with breakfast.     Endocrinology:  Diabetes - Sulfonylureas  Passed - 09/01/2022  3:18 PM      Passed - HBA1C is between 0 and 7.9 and within 180 days    HbA1c, POC (controlled diabetic range)  Date Value Ref Range Status  05/26/2022 7.4 (A) 0.0 - 7.0 % Final         Passed - Cr in normal range and within 360 days    Creat  Date Value Ref Range Status  07/24/2014 0.84 0.50 - 1.10 mg/dL Final   Creatinine, Ser  Date Value Ref Range Status  02/08/2022 0.86 0.57 - 1.00 mg/dL Final   Creatinine, Urine  Date Value Ref Range Status  11/12/2015 115 20 - 320 mg/dL Final         Passed - Valid encounter within last 6 months    Recent Outpatient Visits          1 month ago Medication management   [place], RPH-CPP   1 month ago Encounter for [employer] annual wellness exam   [place],  Dalbert Batman, MD   3 months ago Type 2 diabetes mellitus with hyperlipidemia [facility])   [place] Karle Plumber B, MD   6 months ago Type 2 diabetes mellitus with hyperlipidemia [facility])   [place], MD   11 months ago Type 2 diabetes mellitus with cataract [facility])   [place], MD      Future Appointments            In 2 months Wynetta Emery Dalbert Batman, MD [place]           . sitaGLIPtin (JANUVIA) 100 MG tablet 90 tablet 0    Sig: TAKE 1 TABLET(100 MG) BY MOUTH DAILY     Endocrinology:  Diabetes - DPP-4 Inhibitors Passed - 09/01/2022  3:18 PM      Passed - HBA1C is between 0 and 7.9 and within 180 days    HbA1c, POC (controlled diabetic range)  Date Value Ref Range Status  05/26/2022 7.4 (A) 0.0 - 7.0 % Final         Passed - Cr in normal range and within 360 days    Creat  Date Value Ref Range Status  07/24/2014 0.84 0.50 - 1.10 mg/dL Final   Creatinine, Ser  Date Value Ref Range Status  02/08/2022  0.86 0.57 - 1.00 mg/dL Final   Creatinine, Urine  Date Value Ref Range Status  11/12/2015 115 20 - 320 mg/dL Final         Passed - Valid encounter within last 6 months    Recent Outpatient Visits          1 month ago Medication management   [place], RPH-CPP   1 month ago Encounter for [employer] annual wellness exam   [place] Karle Plumber B, MD   3 months ago Type 2 diabetes mellitus with hyperlipidemia [facility])   [place] Karle Plumber B, MD   6 months ago Type 2 diabetes mellitus with hyperlipidemia [facility])   [place] Ladell Pier, MD   11 months ago Type 2 diabetes mellitus with cataract [facility])   [place], MD      Future Appointments            In 2 months Wynetta Emery Dalbert Batman, MD [place]

## 2022-09-22 ENCOUNTER — Other Ambulatory Visit: Payer: Self-pay | Admitting: Internal Medicine

## 2022-09-22 DIAGNOSIS — Z1231 Encounter for screening mammogram for malignant neoplasm of breast: Secondary | ICD-10-CM

## 2022-10-26 DIAGNOSIS — M25511 Pain in right shoulder: Secondary | ICD-10-CM | POA: Diagnosis not present

## 2022-10-27 ENCOUNTER — Ambulatory Visit: Payer: Medicare Other

## 2022-10-28 DIAGNOSIS — M25511 Pain in right shoulder: Secondary | ICD-10-CM | POA: Diagnosis not present

## 2022-11-02 ENCOUNTER — Ambulatory Visit
Admission: RE | Admit: 2022-11-02 | Discharge: 2022-11-02 | Disposition: A | Discharge status: Home / Self Care | Payer: Medicare Other | Source: Ambulatory Visit | Attending: Internal Medicine | Admitting: Internal Medicine

## 2022-11-02 DIAGNOSIS — Z1231 Encounter for screening mammogram for malignant neoplasm of breast: Secondary | ICD-10-CM

## 2022-11-04 DIAGNOSIS — M720 Palmar fascial fibromatosis [Dupuytren]: Secondary | ICD-10-CM | POA: Diagnosis not present

## 2022-11-07 ENCOUNTER — Other Ambulatory Visit: Payer: Self-pay | Admitting: Internal Medicine

## 2022-11-07 DIAGNOSIS — R928 Other abnormal and inconclusive findings on diagnostic imaging of breast: Secondary | ICD-10-CM

## 2022-11-08 ENCOUNTER — Ambulatory Visit: Payer: Medicare Other | Attending: Internal Medicine | Admitting: Internal Medicine

## 2022-11-08 VITALS — BP 126/76 | HR 80 | Temp 97.9°F | Ht 62.0 in | Wt 142.0 lb

## 2022-11-08 DIAGNOSIS — E11649 Type 2 diabetes mellitus with hypoglycemia without coma: Secondary | ICD-10-CM | POA: Diagnosis not present

## 2022-11-08 DIAGNOSIS — Z780 Asymptomatic menopausal state: Secondary | ICD-10-CM | POA: Diagnosis not present

## 2022-11-08 DIAGNOSIS — E1169 Type 2 diabetes mellitus with other specified complication: Secondary | ICD-10-CM

## 2022-11-08 DIAGNOSIS — Z794 Long term (current) use of insulin: Secondary | ICD-10-CM | POA: Diagnosis not present

## 2022-11-08 DIAGNOSIS — Z2821 Immunization not carried out because of patient refusal: Secondary | ICD-10-CM | POA: Diagnosis not present

## 2022-11-08 DIAGNOSIS — E785 Hyperlipidemia, unspecified: Secondary | ICD-10-CM | POA: Diagnosis not present

## 2022-11-08 DIAGNOSIS — F17200 Nicotine dependence, unspecified, uncomplicated: Secondary | ICD-10-CM | POA: Diagnosis not present

## 2022-11-08 DIAGNOSIS — M8580 Other specified disorders of bone density and structure, unspecified site: Secondary | ICD-10-CM

## 2022-11-08 DIAGNOSIS — Z23 Encounter for immunization: Secondary | ICD-10-CM

## 2022-11-08 DIAGNOSIS — M720 Palmar fascial fibromatosis [Dupuytren]: Secondary | ICD-10-CM

## 2022-11-08 LAB — POCT GLYCOSYLATED HEMOGLOBIN (HGB A1C): HbA1c, POC (controlled diabetic range): 7.8 % — AB (ref 0.0–7.0)

## 2022-11-08 LAB — GLUCOSE, POCT (MANUAL RESULT ENTRY): POC Glucose: 168 mg/dl — AB (ref 70–99)

## 2022-11-08 MED ORDER — GLUCAGON EMERGENCY 1 MG IJ KIT: PACK | INTRAMUSCULAR | 0 refills | Status: DC

## 2022-11-08 MED ORDER — DEXCOM G7 RECEIVER DEVI: 0 refills | Status: AC

## 2022-11-08 MED ORDER — ZOSTER VAC RECOMB ADJUVANTED 50 MCG/0.5ML IM SUSR: 0.5000 mL | Freq: Once | INTRAMUSCULAR | 0 refills | Status: AC

## 2022-11-08 MED ORDER — DEXCOM G7 SENSOR MISC: 6 refills | Status: DC

## 2022-11-08 NOTE — Progress Notes (Signed)
  Patient ID: Bailey [name], female    DOB: 12/21/1956  MRN: [id number]  CC: Diabetes (Dm f/u. /No to flu vax)   Subjective: Bailey [name] is a 66 y.o. female who presents for chronic ds management Her concerns today include:  Pt with hx of DM, HL, OA (RT shoulder and knees), Dupuytren's contractures, gastric ulcer, Vit D def, tob dep, thrombocytopenia.  On Gabapentin for hot flashes.   Pt upset because she was charged a co-pay this a.m.  Reports she has never had a co-pay for [employer] since she has been coming here.  She was on the phone with her insurance when I walked in the room.  DM: Results for orders placed or performed in visit on 11/08/22  POCT glucose (manual entry)  Result Value Ref Range   POC Glucose 168 (A) 70 - 99 mg/dl  POCT glycosylated hemoglobin (Hb A1C)  Result Value Ref Range   Hemoglobin A1C     HbA1c POC (<> result, manual entry)     HbA1c, POC (prediabetic range)     HbA1c, POC (controlled diabetic range) 7.8 (A) 0.0 - 7.0 %   Pt fasting this a.m.  BS this a.m when she woke was 105.  Eat a piece of gum. Has Dexcom G6 Device.  She has written down some of her readings for a.m over past 2 wks Range 90-145.  Her device does not gives information of percentage of time that she is within range, low or high. Reports low BS through the night 3-4x/mth Compliant with meds: That include glargine insulin 33 units daily, Amaryl 4 mg daily, Jardiance 10 mg daily and Januvia 100 mg daily. Walking 3x/wk for 30 mins.  Plans to get back to [employer] now that it is cold outside Doing well with eating.  Eat at home 90% of the times  Elev BP:  pt reports BP elev today because she was upset about being charged a co-pay Still checks BP at home.  Reports going readings SBP <140.   -limits salt  HL:  taking and tolerating Lipitor 40 mg  MMG: Asymmetry seen in LT breast on recent MMG. Has appt for additional imaging later this mth  Osteopenia on BMD:  takes Vit D OTC but  not sure of the dose.  Taking Ca 600 mg once a day  Dupuytren's contractures:  surgery scheduled for 12/05/2022 for RT 5th finger.  Had similar surgery on LT hand several yrs ago  Has appt this wk with Dr. Supple for RT shoulder.   Tobacco dependence: She continues to smoke and had set a plan to quit smoking by the end of the year on her Medicare wellness visit back in July..   HM:  declines flu shot.  Will get COVID booster at [facility].  Due for 2nd Shingles vaccine  Patient Active Problem List   Diagnosis Date Noted   Thrombocytopenia (HCC) 04/12/2021   Tobacco dependence 10/05/2017   Spinal stenosis of lumbar region with neurogenic claudication 10/05/2017   Osteoarthritis of spine with radiculopathy, lumbar region 10/05/2017   Plantar wart of right foot 06/01/2017   Right sided weakness 12/24/2015   Dyslipidemia with low high density lipoprotein (HDL) cholesterol with hypertriglyceridemia due to type 2 diabetes mellitus (HCC) 11/16/2015   Uncontrolled type 2 diabetes mellitus with hyperglycemia (HCC) 11/12/2015   Menopausal symptoms 11/12/2015   Dupuytren's contracture of both hands 07/05/2013   Need for Tdap vaccination 07/05/2013   Arthritis      Current Outpatient   Medications on File Prior to Visit  Medication Sig Dispense Refill   atorvastatin (LIPITOR) 40 MG tablet Take 1 tablet (40 mg total) by mouth daily. 90 tablet 0   Blood Glucose Monitoring Suppl (ONETOUCH VERIO) w/Device KIT Use to check blood sugar daily. 1 kit 0   Continuous Blood Gluc Transmit (DEXCOM G6 TRANSMITTER) MISC Use to check blood sugar three times daily. Change transmitter once every 90 days. 1 each 1   empagliflozin (JARDIANCE) 10 MG TABS tablet Take 1 tablet (10 mg total) by mouth daily. 90 tablet 3   gabapentin (NEURONTIN) 300 MG capsule TAKE 1 CAPSULE(300 MG) BY MOUTH AT BEDTIME 90 capsule 1   glimepiride (AMARYL) 4 MG tablet Take 1 tablet (4 mg total) by mouth daily with breakfast. 90 tablet 3   glucose  blood (ONETOUCH VERIO) test strip Use as instructed to check blood sugar daily. 100 each 11   insulin glargine, 1 Unit Dial, (TOUJEO SOLOSTAR) 300 UNIT/ML Solostar Pen Inject 33 Units into the skin daily. 4.5 mL 2   Lancet Devices (ACCU-CHEK SOFTCLIX) lancets Use as instructed 1 each 0   losartan (COZAAR) 25 MG tablet Take 25 mg by mouth daily.     meloxicam (MOBIC) 15 MG tablet TAKE 1 TABLET (15 MG TOTAL) BY MOUTH DAILY. 90 tablet 2   Multiple Vitamins-Minerals (MULTIVITAMIN WITH MINERALS) tablet Take 1 tablet by mouth every other day.      OneTouch Delica Lancets 31V MISC Use to check blood sugar daily. 100 each 11   sitaGLIPtin (JANUVIA) 100 MG tablet TAKE 1 TABLET(100 MG) BY MOUTH DAILY 90 tablet 0   Insulin Pen Needle (BD PEN NEEDLE NANO U/F) 32G X 4 MM MISC Use once a day with insulin pen (Patient not taking: Reported on 11/08/2022) 100 each 6   Current Facility-Administered Medications on File Prior to Visit  Medication Dose Route Frequency Provider Last Rate Last Admin   0.9 %  sodium chloride infusion  500 mL Intravenous Once Nandigam, Venia Minks, MD        Allergies  Allergen Reactions   Metformin And Related Diarrhea   Shellfish Allergy Anaphylaxis, Hives and Swelling    Lobster, crab (shrimp's ok)   Ivp Dye [Iodinated Contrast Media] Hives   Adhesive [Tape] Other (See Comments)    blisters   Chantix [Varenicline] Other (See Comments)    depression   Codeine Other (See Comments)    Unknown childhood allergy   Hydrocodone-Acetaminophen    Latex Other (See Comments)    blisters   Penicillins Other (See Comments)    Unknown childhood reaction    Social History   Socioeconomic History   Marital status: Married    Spouse name: Louie Casa   Number of children: 0   Years of education: 15   Highest education level: Not on file  Occupational History   Occupation: Dispensing optician: [employer]     Comment: Civil engineer, contracting  Tobacco Use   Smoking status:  Every Day    Packs/day: 0.25    Years: 28.00    Total pack years: 7.00    Types: Cigarettes   Smokeless tobacco: Never  Vaping Use   Vaping Use: Never used  Substance and Sexual Activity   Alcohol use: No    Alcohol/week: 0.0 standard drinks of alcohol   Drug use: No   Sexual activity: Yes    Partners: Male    Birth control/protection: Surgical  Other Topics Concern   Not on file  Social History Narrative   Currently living alone, separated from husband.  One story home.  [name]'s children are adults-2 live in [place], 2 live in [place].     Works for a [employer].     Education: 2 years of college.   Social Determinants of Health   Financial Resource Strain: Not on file  Food Insecurity: Not on file  Transportation Needs: Not on file  Physical Activity: Not on file  Stress: Not on file  Social Connections: Not on file  Intimate Partner Violence: Not on file    Family History  Problem Relation Age of Onset   Diabetes Mother    Hypertension Mother    Dementia Mother    Pancreatitis Mother    Asthma Mother    COPD Mother    Stroke Mother    Hyperlipidemia Mother    Heart disease Mother    Hypertension Father    Hypertension Sister    Diabetes Sister    Hyperlipidemia Sister    Hypertension Brother    Diabetes Brother    Drug abuse Brother    Cancer Brother        colon   Hyperlipidemia Brother    Colon cancer Brother 71   Hyperlipidemia Brother    Hypertension Brother    Hyperlipidemia Sister    Hypertension Sister    Diabetes Sister    Diabetes Maternal Grandmother    Colon cancer Maternal Grandmother    Breast cancer Neg Hx    Esophageal cancer Neg Hx    Stomach cancer Neg Hx     Past Surgical History:  Procedure Laterality Date   COLONOSCOPY  last one 07-15-2016   ESOPHAGOGASTRODUODENOSCOPY  last one 2008   FASCIECTOMY Left 08/24/2016   Procedure: LEFT HAND PALMER AND SMALL FINGER DIGiTAL FASCIECTOMY AND RELEASE;  Surgeon: Iran Planas,  MD;  Location: [facility];  Service: Orthopedics;  Laterality: Left;   LAPAROSCOPIC CHOLECYSTECTOMY  07/17/2000   ROTATOR CUFF REPAIR Right 2000   TOTAL ABDOMINAL HYSTERECTOMY W/ BILATERAL SALPINGOOPHORECTOMY  1998    ROS: Review of Systems Negative except as stated above  PHYSICAL EXAM: BP 126/76   Pulse 80   Temp 97.9 F (36.6 C) (Oral)   Ht 5' 2" (1.575 m)   Wt 142 lb (64.4 kg)   SpO2 100%   BMI 25.97 kg/m   Physical Exam  General appearance - alert, well appearing, older African-American female and in no distress Mental status - normal mood, behavior, speech, dress, motor activity, and thought processes Neck - supple, no significant adenopathy Chest - clear to auscultation, no wheezes, rales or rhonchi, symmetric air entry Heart - normal rate, regular rhythm, normal S1, S2, no murmurs, rubs, clicks or gallops Musculoskeletal - RT hand:  thickening of palmar flexor tendon for 5th finger causing flexion at the DIP and PIP jts Extremities - no LE edema      Latest Ref Rng & Units 02/08/2022    9:42 AM 04/12/2021   11:31 AM 08/21/2019    3:10 PM  CMP  Glucose 70 - 99 mg/dL 117  148  365   BUN 8 - 27 mg/dL _0 Creatinine 0.57 - 1.00 mg/dL 0.86  0.81  0.65   Sodium 134 - 144 mmol/L 146  145  138   Potassium 3.5 - 5.2 mmol/L 4.0  3.9  4.3   Chloride 96 - 106 mmol/L 109  106  100   CO2 20 -  29 mmol/L _0 Calcium 8.7 - 10.3 mg/dL 9.5  9.7  9.2   Total Protein 6.0 - 8.5 g/dL 7.0  7.1  6.8   Total Bilirubin 0.0 - 1.2 mg/dL 0.4  0.3  0.2   Alkaline Phos 44 - 121 IU/L 74  82  103   AST 0 - 40 IU/L _1 ALT 0 - 32 IU/L 34  29  20    Lipid Panel     Component Value Date/Time   CHOL 97 (L) 02/08/2022 0942   TRIG 88 02/08/2022 0942   HDL 39 (L) 02/08/2022 0942   CHOLHDL 2.5 02/08/2022 0942   CHOLHDL 7.0 (H) 11/12/2015 1153   VLDL NOT CALC 11/12/2015 1153   LDLCALC 41 02/08/2022 0942   LDLDIRECT 45 06/10/2016 1213    CBC     Component Value Date/Time   WBC 7.8 02/08/2022 0942   WBC 7.6 08/22/2015 1009   RBC 4.65 02/08/2022 0942   RBC 4.66 08/22/2015 1009   HGB 14.8 02/08/2022 0942   HCT 44.3 02/08/2022 0942   PLT 118 (L) 02/08/2022 0942   MCV 95 02/08/2022 0942   MCH 31.8 02/08/2022 0942   MCH 31.1 08/22/2015 1009   MCHC 33.4 02/08/2022 0942   MCHC 33.6 08/22/2015 1009   RDW 13.5 02/08/2022 0942   LYMPHSABS 2.4 08/22/2015 1009   MONOABS 0.7 08/22/2015 1009   EOSABS 0.1 08/22/2015 1009   BASOSABS 0.0 08/22/2015 1009    ASSESSMENT AND PLAN:  1. Type 2 diabetes mellitus with hypoglycemia without coma, with long-term current use of insulin (HCC) A1c is not at goal and has increased since last visit.  We discussed increasing Jardiance to 25 mg daily but patient wants to hold off for now and see what her A1c looks like on next visit.  She will continue Jardiance 10 mg daily, Januvia 100 mg daily, Amaryl 4 mg daily and glargine 33 units daily. - POCT glucose (manual entry) - POCT glycosylated hemoglobin (Hb A1C) - Continuous Blood Gluc Sensor (DEXCOM G7 SENSOR) MISC; UAD  Dispense: 3 each; Refill: 6 - Continuous Blood Gluc Receiver ([place]) DEVI; UAD  Dispense: 1 each; Refill: 0 - Glucagon, rDNA, (GLUCAGON EMERGENCY) 1 MG KIT; Inj 1 mg IM as needed for significant low blood sugar episode  Dispense: 1 kit; Refill: 0  2. Osteopenia after menopause Continue calcium supplement.  She will call me back to let me know the dose of vitamin D she is taking.  Should be on at least 800 IU daily.  She is agreeable to having vitamin D level checked. - VITAMIN D 25 Hydroxy (Vit-D Deficiency, Fractures)  3. Dupuytren's contracture of right hand Plan for surgery next month.  Clinically optimized  4. Hyperlipidemia associated with type 2 diabetes mellitus (HCC) LDL at goal on current dose of atorvastatin.  5. Influenza vaccination declined Recommended.  Patient declined  6. Need for shingles vaccine I  have printed a prescription for her to get the Shingrix No. 2 at her outside pharmacy - Zoster Vaccine Adjuvanted [facility]) injection; Inject 0.5 mLs into the muscle once for 1 dose.  Dispense: 0.5 mL; Refill: 0  7. Tobacco dependence Continue to encourage her to quit.    Patient was given the opportunity to ask questions.  Patient verbalized understanding of the plan and was able to repeat key elements of the plan.   This documentation was completed using Dragon voice recognition  technology.  Any transcriptional errors are unintentional.  Orders Placed This Encounter  Procedures   VITAMIN D 25 Hydroxy (Vit-D Deficiency, Fractures)   POCT glucose (manual entry)   POCT glycosylated hemoglobin (Hb A1C)     Requested Prescriptions   Signed Prescriptions Disp Refills   Zoster Vaccine Adjuvanted (SHINGRIX) injection 0.5 mL 0    Sig: Inject 0.5 mLs into the muscle once for 1 dose.   Continuous Blood Gluc Sensor (DEXCOM G7 SENSOR) MISC 3 each 6    Sig: UAD   Continuous Blood Gluc Receiver (DEXCOM G7 RECEIVER) DEVI 1 each 0    Sig: UAD   Glucagon, rDNA, (GLUCAGON EMERGENCY) 1 MG KIT 1 kit 0    Sig: Inj 1 mg IM as needed for significant low blood sugar episode    Return in about 4 months (around 03/09/2023).  Deborah Johnson, MD, FACP 

## 2022-11-09 LAB — VITAMIN D 25 HYDROXY (VIT D DEFICIENCY, FRACTURES): Vit D, 25-Hydroxy: 30.3 ng/mL (ref 30.0–100.0)

## 2022-11-10 DIAGNOSIS — M25511 Pain in right shoulder: Secondary | ICD-10-CM | POA: Diagnosis not present

## 2022-11-21 ENCOUNTER — Other Ambulatory Visit: Payer: Self-pay | Admitting: Internal Medicine

## 2022-11-21 DIAGNOSIS — Z794 Long term (current) use of insulin: Secondary | ICD-10-CM

## 2022-11-21 MED ORDER — DEXCOM G7 SENSOR MISC: 3 refills | Status: DC

## 2022-11-23 ENCOUNTER — Ambulatory Visit
Admission: RE | Admit: 2022-11-23 | Discharge: 2022-11-23 | Disposition: A | Discharge status: Home / Self Care | Payer: Medicare Other | Source: Ambulatory Visit | Attending: Internal Medicine | Admitting: Internal Medicine

## 2022-11-23 ENCOUNTER — Other Ambulatory Visit: Payer: Self-pay | Admitting: Internal Medicine

## 2022-11-23 DIAGNOSIS — R928 Other abnormal and inconclusive findings on diagnostic imaging of breast: Secondary | ICD-10-CM

## 2022-11-23 DIAGNOSIS — R92322 Mammographic fibroglandular density, left breast: Secondary | ICD-10-CM | POA: Diagnosis not present

## 2022-11-23 DIAGNOSIS — N6489 Other specified disorders of breast: Secondary | ICD-10-CM | POA: Diagnosis not present

## 2022-11-25 ENCOUNTER — Other Ambulatory Visit: Payer: Self-pay | Admitting: Internal Medicine

## 2022-11-25 DIAGNOSIS — E1169 Type 2 diabetes mellitus with other specified complication: Secondary | ICD-10-CM

## 2022-12-05 ENCOUNTER — Other Ambulatory Visit: Payer: Self-pay | Admitting: Internal Medicine

## 2022-12-05 ENCOUNTER — Other Ambulatory Visit: Payer: Self-pay

## 2022-12-05 DIAGNOSIS — E1136 Type 2 diabetes mellitus with diabetic cataract: Secondary | ICD-10-CM

## 2022-12-05 DIAGNOSIS — M720 Palmar fascial fibromatosis [Dupuytren]: Secondary | ICD-10-CM | POA: Diagnosis not present

## 2022-12-06 DIAGNOSIS — M25641 Stiffness of right hand, not elsewhere classified: Secondary | ICD-10-CM | POA: Diagnosis not present

## 2022-12-14 ENCOUNTER — Other Ambulatory Visit: Payer: Self-pay | Admitting: Internal Medicine

## 2022-12-16 DIAGNOSIS — M25641 Stiffness of right hand, not elsewhere classified: Secondary | ICD-10-CM | POA: Diagnosis not present

## 2022-12-30 DIAGNOSIS — M25641 Stiffness of right hand, not elsewhere classified: Secondary | ICD-10-CM | POA: Diagnosis not present

## 2023-01-06 DIAGNOSIS — M25641 Stiffness of right hand, not elsewhere classified: Secondary | ICD-10-CM | POA: Diagnosis not present

## 2023-01-08 ENCOUNTER — Other Ambulatory Visit: Payer: Self-pay | Admitting: Internal Medicine

## 2023-01-08 DIAGNOSIS — E1169 Type 2 diabetes mellitus with other specified complication: Secondary | ICD-10-CM

## 2023-01-09 NOTE — Telephone Encounter (Signed)
Refilled 11/25/2022 #[id number] rf. Requested Prescriptions  Pending Prescriptions Disp Refills   atorvastatin (LIPITOR) 40 MG tablet [Pharmacy Med Name: ATORVASTATIN 40 MG TABLET] 90 tablet 0    Sig: TAKE 1 TABLET BY MOUTH EVERY DAY     Cardiovascular:  Antilipid - Statins Failed - 01/08/2023  3:40 PM      Failed - Lipid Panel in normal range within the last 12 months    Cholesterol, Total  Date Value Ref Range Status  02/08/2022 97 (L) 100 - 199 mg/dL Final   LDL Chol Calc (NIH)  Date Value Ref Range Status  02/08/2022 41 0 - 99 mg/dL Final   Direct LDL  Date Value Ref Range Status  06/10/2016 45 <130 mg/dL Final    Comment:      Desirable range <100 mg/dL for patients with CHD or diabetes and <70 mg/dL for diabetic patients with known heart disease.      HDL  Date Value Ref Range Status  02/08/2022 39 (L) >39 mg/dL Final   Triglycerides  Date Value Ref Range Status  02/08/2022 88 0 - 149 mg/dL Final         Passed - Patient is not pregnant      Passed - Valid encounter within last 12 months    Recent Outpatient Visits           2 months ago Type 2 diabetes mellitus with hypoglycemia without coma, with long-term current use of insulin ([place])   [place], MD   6 months ago Medication management   [place], RPH-CPP   6 months ago Encounter for [employer] annual wellness exam   [place] Karle Plumber B, MD   7 months ago Type 2 diabetes mellitus with hyperlipidemia [facility])   [place], MD   11 months ago Type 2 diabetes mellitus with hyperlipidemia [facility])   [place], MD       Future Appointments             In 2 months Wynetta Emery Dalbert Batman, MD [place]

## 2023-01-10 ENCOUNTER — Other Ambulatory Visit: Payer: Self-pay | Admitting: Internal Medicine

## 2023-01-13 DIAGNOSIS — M25641 Stiffness of right hand, not elsewhere classified: Secondary | ICD-10-CM | POA: Diagnosis not present

## 2023-01-20 DIAGNOSIS — M25641 Stiffness of right hand, not elsewhere classified: Secondary | ICD-10-CM | POA: Diagnosis not present

## 2023-01-26 DIAGNOSIS — M25641 Stiffness of right hand, not elsewhere classified: Secondary | ICD-10-CM | POA: Diagnosis not present

## 2023-01-31 ENCOUNTER — Other Ambulatory Visit: Payer: Self-pay

## 2023-01-31 ENCOUNTER — Emergency Department (HOSPITAL_COMMUNITY): Payer: Medicare Other

## 2023-01-31 ENCOUNTER — Emergency Department (HOSPITAL_COMMUNITY)
Admission: EM | Admit: 2023-01-31 | Discharge: 2023-01-31 | Disposition: A | Discharge status: Home / Self Care | Payer: Medicare Other | Attending: Emergency Medicine | Admitting: Emergency Medicine

## 2023-01-31 DIAGNOSIS — E119 Type 2 diabetes mellitus without complications: Secondary | ICD-10-CM | POA: Diagnosis not present

## 2023-01-31 DIAGNOSIS — Z1152 Encounter for screening for COVID-19: Secondary | ICD-10-CM | POA: Diagnosis not present

## 2023-01-31 DIAGNOSIS — Z7984 Long term (current) use of oral hypoglycemic drugs: Secondary | ICD-10-CM | POA: Diagnosis not present

## 2023-01-31 DIAGNOSIS — M25512 Pain in left shoulder: Secondary | ICD-10-CM | POA: Diagnosis not present

## 2023-01-31 DIAGNOSIS — Z91040 Latex allergy status: Secondary | ICD-10-CM | POA: Insufficient documentation

## 2023-01-31 DIAGNOSIS — Z794 Long term (current) use of insulin: Secondary | ICD-10-CM | POA: Diagnosis not present

## 2023-01-31 DIAGNOSIS — J069 Acute upper respiratory infection, unspecified: Secondary | ICD-10-CM | POA: Diagnosis not present

## 2023-01-31 LAB — RESP PANEL BY RT-PCR (RSV, FLU A&B, COVID)  RVPGX2
Influenza A by PCR: NEGATIVE
Influenza B by PCR: NEGATIVE
Resp Syncytial Virus by PCR: NEGATIVE
SARS Coronavirus 2 by RT PCR: NEGATIVE

## 2023-01-31 MED ORDER — HYDROCODONE-ACETAMINOPHEN 5-325 MG PO TABS: 1.0000 | ORAL_TABLET | ORAL | 0 refills | Status: DC | PRN

## 2023-01-31 MED ORDER — HYDROCODONE-ACETAMINOPHEN 5-325 MG PO TABS: 1.0000 | ORAL_TABLET | ORAL | 0 refills | Status: AC | PRN

## 2023-01-31 NOTE — Discharge Instructions (Addendum)
Please follow-up with Dr. Onnie Graham for your shoulder pain.  You may follow-up on your COVID and flu swab results via MyChart.  There are instructions in your discharge paperwork on how to download this app.  If your results are positive, please make sure to quarantine.  Drink plenty of fluids and take over-the-counter medicines.  Take Norco as needed for pain.  Do not take additional Tylenol with this.  Do not drink or drive with this medication as it can make you sleepy.  Return to the ER for any new or worsening symptoms.

## 2023-01-31 NOTE — ED Provider Notes (Signed)
[place] EMERGENCY DEPARTMENT AT [facility] Provider Note   CSN: [id number] Arrival date & time: 01/31/23  0730     History  Chief Complaint  Patient presents with   Shoulder Pain    Bailey [name] is a 67 y.o. female.  HPI 67 year old female with a history of DM type II, right carpal tunnel syndrome, Dupuytren's contracture of both hands, spinal stenosis of the lumbar region presents to the ER with complaints of 2 weeks of left shoulder pain.  Pain is worse in the mornings, and her shoulder is stiff, she states that she has desist lifting her left arm up above her shoulder and then it slowly loosens up and does provide improved throughout the day.  She has been taking Tylenol, Voltaren gel, Lidoderm patch with little relief.  Denies any falls or injuries.  Denies any numbness or tingling.  Denies any history of IV drug use.  She also endorses URI symptoms which started yesterday with nasal congestion and watery eyes.    Home Medications Prior to Admission medications   Medication Sig Start Date End Date Taking? Authorizing Provider  atorvastatin (LIPITOR) 40 MG tablet TAKE 1 TABLET BY MOUTH EVERY DAY 11/25/22   Ladell Pier, MD  Blood Glucose Monitoring Suppl (ONETOUCH VERIO) w/Device KIT Use to check blood sugar daily. 09/13/19   Ladell Pier, MD  Continuous Blood Gluc Receiver (DEXCOM G7 RECEIVER) [place] UAD 11/08/22   Ladell Pier, MD  Continuous Blood Gluc Sensor (DEXCOM G7 SENSOR) MISC Use to check blood sugar three times daily. Change sensors once every 10 days. 11/21/22   Ladell Pier, MD  Continuous Blood Gluc Transmit (DEXCOM G6 TRANSMITTER) MISC USE TO CHECK BLOOD SUGAR THREE TIMES DAILY. CHANGE TRANSMITTER ONCE EVERY 90 DAYS. 01/10/23   Ladell Pier, MD  empagliflozin (JARDIANCE) 10 MG TABS tablet Take 1 tablet (10 mg total) by mouth daily. 05/26/22   Ladell Pier, MD  gabapentin (NEURONTIN) 300 MG capsule TAKE 1  CAPSULE(300 MG) BY MOUTH AT BEDTIME 12/16/22   Ladell Pier, MD  glimepiride (AMARYL) 4 MG tablet Take 1 tablet (4 mg total) by mouth daily with breakfast. 02/08/22   Ladell Pier, MD  Glucagon, rDNA, (GLUCAGON EMERGENCY) 1 MG KIT Inj 1 mg IM as needed for significant low blood sugar episode 11/08/22   Ladell Pier, MD  glucose blood (ONETOUCH VERIO) test strip Use as instructed to check blood sugar daily. 09/13/19   Ladell Pier, MD  HYDROcodone-acetaminophen (NORCO/VICODIN) 5-325 MG tablet Take 1 tablet by mouth every 4 (four) hours as needed for up to 3 days. 01/31/23 02/03/23  Garald Balding, PA-C  insulin glargine, 1 Unit Dial, (TOUJEO SOLOSTAR) 300 UNIT/ML Solostar Pen Inject 33 Units into the skin daily. 05/26/22   Ladell Pier, MD  Insulin Pen Needle (BD PEN NEEDLE NANO U/F) 32G X 4 MM MISC Use once a day with insulin pen Patient not taking: Reported on 11/08/2022 05/04/22   Ladell Pier, MD  Lancet Devices (ACCU-CHEK [facility]) lancets Use as instructed 06/15/16   Boykin Nearing, MD  losartan (COZAAR) 25 MG tablet Take 25 mg by mouth daily.    [provider]  meloxicam (MOBIC) 15 MG tablet TAKE 1 TABLET (15 MG TOTAL) BY MOUTH DAILY. 04/29/22   Ladell Pier, MD  Multiple Vitamins-Minerals (MULTIVITAMIN WITH MINERALS) tablet Take 1 tablet by mouth every other day.     [provider]  Jonetta Speak  Lancets 33G MISC Use to check blood sugar daily. 09/13/19   Ladell Pier, MD  sitaGLIPtin (JANUVIA) 100 MG tablet TAKE 1 TABLET BY MOUTH EVERY DAY 12/05/22   Ladell Pier, MD      Allergies    Metformin and related, Shellfish allergy, Ivp dye [iodinated contrast media], Adhesive [tape], Chantix [varenicline], Codeine, Hydrocodone-acetaminophen, Latex, and Penicillins    Review of Systems   Review of Systems Ten systems reviewed and are negative for acute change, except as noted in the HPI.   Physical Exam Updated Vital Signs BP  (!) 141/81 (BP Location: Left Arm)   Pulse 72   Temp 98.8 F (37.1 C) (Oral)   Resp 16   Ht '5\' 2"'$  (1.575 m)   Wt 65 kg   SpO2 100%   BMI 26.21 kg/m  Physical Exam Vitals and nursing note reviewed.  Constitutional:      General: She is not in acute distress.    Appearance: She is well-developed.  HENT:     Head: Normocephalic and atraumatic.  Eyes:     Conjunctiva/sclera: Conjunctivae normal.  Cardiovascular:     Rate and Rhythm: Normal rate and regular rhythm.     Heart sounds: No murmur heard. Pulmonary:     Effort: Pulmonary effort is normal. No respiratory distress.     Breath sounds: Normal breath sounds.  Abdominal:     Palpations: Abdomen is soft.     Tenderness: There is no abdominal tenderness.  Musculoskeletal:        General: No swelling or signs of injury.     Cervical back: Neck supple.     Comments: Left shoulder stiff but able to raise above midline, 2+ radial pulses, sensations intact, 5/5 grip strength bilaterally.  No visible step-offs or deformities.  Skin:    General: Skin is warm and dry.     Capillary Refill: Capillary refill takes less than 2 seconds.  Neurological:     Mental Status: She is alert and oriented to person, place, and time.  Psychiatric:        Mood and Affect: Mood normal.     ED Results / Procedures / Treatments   Labs (all labs ordered are listed, but only abnormal results are displayed) Labs Reviewed  RESP PANEL BY RT-PCR (RSV, FLU A&B, COVID)  [id number]    EKG None  Radiology DG Shoulder Left  Result Date: 01/31/2023 CLINICAL DATA:  Left shoulder pain for 2 weeks, no reported injury EXAM: LEFT SHOULDER - 2+ VIEW COMPARISON:  04/16/2009 left shoulder radiographs FINDINGS: No fracture. No glenohumeral dislocation. No evidence of acromioclavicular separation. Small subacromial spur. No significant arthropathy. No suspicious focal osseous lesions. No radiopaque foreign bodies or pathologic soft tissue calcifications. IMPRESSION:  No acute osseous abnormality. Small subacromial spur. Electronically Signed   By: Ilona Sorrel M.D.   On: 01/31/2023 09:07    Procedures Procedures    Medications Ordered in ED Medications - No data to display  ED Course/ Medical Decision Making/ A&P                             Medical Decision Making Amount and/or Complexity of Data Reviewed Radiology: ordered.   67 year old female presenting to the ER with left shoulder pain x 2 weeks.  Neurovascularly intact, shoulder is stiff but she is able to lift above midline.  Her x-rays were reviewed, agree with radiology read, do not show any fractures  or dislocations but does show small spur.  COVID and flu swab ordered.  Vitals overall reassuring.  She was encouraged to follow-up on her COVID and flu test via MyChart.  Discussed x-ray findings with patient, she will follow-up with Dr. Onnie Graham who she has seen in the past for orthopedic complaints.  Will prescribe a short course of Norco, PDMP reviewed, appropriate.  Nonspecific allergy listed but she just received a prescription for this in December and has had Norco multiple times in the past.  She denies any allergic reaction to this medication in the past.  We discussed return precautions.  Stable for discharge. Final Clinical Impression(s) / ED Diagnoses Final diagnoses:  Acute pain of left shoulder  Upper respiratory tract infection, unspecified type    Rx / DC Orders ED Discharge Orders          Ordered    HYDROcodone-acetaminophen (NORCO/VICODIN) 5-325 MG tablet  Every 4 hours PRN,   Status:  Discontinued        01/31/23 0928    HYDROcodone-acetaminophen (NORCO/VICODIN) 5-325 MG tablet  Every 4 hours PRN        01/31/23 0930              Garald Balding, PA-C 01/31/23 0930    Teg[place], Gwenyth Allegra, MD 01/31/23 [phone]

## 2023-01-31 NOTE — ED Triage Notes (Signed)
Pt reports left shoulder pain x 2 weeks, denies injury or trauma. Also c/o cough and congestion.

## 2023-02-02 ENCOUNTER — Telehealth: Payer: Self-pay

## 2023-02-02 NOTE — Patient Outreach (Addendum)
  Care Coordination TOC Note Transition Care Management Follow-up Telephone Call Date of discharge and from where: 01/31/23-[facility]  Dx: "Acute pain of left shoulder" Red on EMMI-ED Discharge Alert Reason: "Scheduled follow-up appt? No" Red Alert Date: 02/01/23 How have you been since you were released from the hospital? Patient states she is "still having pain to shoulder and rib cage." She is taking pain med-but trying not to take too much-only taking it about 3x/day. Discussed pain mgmt measures with patient. She feels like her resp sxs are improving.  Any questions or concerns? Yes-Patient voices that she has bern trying to get a follow up appt scheduled with ortho MD but has not been able to get through. She voices she keeps getting "high call volume message" and long wait time on hold and has not been able to make an appt.  Items Reviewed: Did the pt receive and understand the discharge instructions provided? Yes  Medications obtained and verified? Yes  Other? Yes  Any new allergies since your discharge? No  Dietary orders reviewed? No Do you have support at home? Yes   Home Care and Equipment/Supplies: Were home health services ordered? not applicable If so, what is the name of the agency? N/A  Has the agency set up a time to come to the patient's home? not applicable Were any new equipment or medical supplies ordered?  No What is the name of the medical supply agency? N/A Were you able to get the supplies/equipment? not applicable Do you have any questions related to the use of the equipment or supplies? No  Functional Questionnaire: (I = Independent and D = Dependent) ADLs: I  Bathing/Dressing- I  Meal Prep- I  Eating- I  Maintaining continence- I  Transferring/Ambulation- I  Managing Meds- I  Follow up appointments reviewed:  PCP Hospital f/u appt confirmed? No   Specialist Hospital f/u appt confirmed?  [facility]-Tracy Shuford,PA-02/08/23-9am   Are  transportation arrangements needed? No  If their condition worsens, is the pt aware to call PCP or go to the Emergency Dept.? Yes Was the patient provided with contact information for the PCP's office or ED? Yes Was to pt encouraged to call back with questions or concerns? Yes  SDOH assessments and interventions completed:   Yes SDOH Interventions Today    Flowsheet Row Most Recent Value  SDOH Interventions   Food Insecurity Interventions Intervention Not Indicated  Transportation Interventions Intervention Not Indicated       Care Coordination Interventions:  Education provided regarding pain mgmt. RN CM completed conference call with patient and [facility]([phone]) to schedule follow up ppt with ortho MD-Dr. Onnie Graham. Advised by staff that they had responded to patient's message on their patient portal. Patient has been scheduled with appt with D. Supple's PA-Tracy on 02/08/23. Patient was unaware as she voiced she did not receive any type of alert/notification regarding appt and response in portal.    Encounter Outcome:  Pt. Visit Completed   Enzo Montgomery, RN,BSN,CCM [facility] Telephonic Care Management Coordinator Direct Phone: [phone] Toll Free: [phone] Fax: [phone]

## 2023-02-09 ENCOUNTER — Emergency Department (HOSPITAL_COMMUNITY)
Admission: EM | Admit: 2023-02-09 | Discharge: 2023-02-09 | Disposition: A | Discharge status: Home / Self Care | Payer: Medicare Other | Attending: Emergency Medicine | Admitting: Emergency Medicine

## 2023-02-09 ENCOUNTER — Emergency Department (HOSPITAL_COMMUNITY): Payer: Medicare Other

## 2023-02-09 ENCOUNTER — Other Ambulatory Visit: Payer: Self-pay

## 2023-02-09 DIAGNOSIS — Y92410 Unspecified street and highway as the place of occurrence of the external cause: Secondary | ICD-10-CM | POA: Diagnosis not present

## 2023-02-09 DIAGNOSIS — M25511 Pain in right shoulder: Secondary | ICD-10-CM | POA: Insufficient documentation

## 2023-02-09 DIAGNOSIS — Z91040 Latex allergy status: Secondary | ICD-10-CM | POA: Diagnosis not present

## 2023-02-09 DIAGNOSIS — Z794 Long term (current) use of insulin: Secondary | ICD-10-CM | POA: Diagnosis not present

## 2023-02-09 DIAGNOSIS — M545 Low back pain, unspecified: Secondary | ICD-10-CM | POA: Diagnosis not present

## 2023-02-09 DIAGNOSIS — M25512 Pain in left shoulder: Secondary | ICD-10-CM | POA: Insufficient documentation

## 2023-02-09 DIAGNOSIS — M25641 Stiffness of right hand, not elsewhere classified: Secondary | ICD-10-CM | POA: Diagnosis not present

## 2023-02-09 MED ORDER — LIDOCAINE 5 % EX PTCH: 1.0000 | MEDICATED_PATCH | CUTANEOUS | 0 refills | Status: DC

## 2023-02-09 MED ORDER — METHOCARBAMOL 500 MG PO TABS: 500.0000 mg | ORAL_TABLET | Freq: Two times a day (BID) | ORAL | 0 refills | Status: DC
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
## 2023-02-09 NOTE — Discharge Instructions (Signed)
Take Robaxin in the evening, do not take the days will make you drowsy and you should not drive on it.  You can try the Lidoderm patch to your low back.  Follow-up with your primary this week for reevaluation.  Return to the ED for loss of bladder or bowel function, severe headache, abdominal pain, shortness of breath, coughing up blood, blood in urine or new or concerning symptoms.

## 2023-02-09 NOTE — ED Notes (Signed)
Pt teaching provided on medications that may cause drowsiness. Pt instructed not to drive or operate heavy machinery while taking the prescribed medication. Pt verbalized understanding.   Pt provided discharge instructions and prescription information. Pt was given the opportunity to ask questions and questions were answered.   
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
## 2023-02-13 ENCOUNTER — Telehealth: Payer: Self-pay
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
## 2023-02-16 DIAGNOSIS — M25641 Stiffness of right hand, not elsewhere classified: Secondary | ICD-10-CM | POA: Diagnosis not present

## 2023-02-19 ENCOUNTER — Emergency Department (HOSPITAL_COMMUNITY)
Admission: EM | Admit: 2023-02-19 | Discharge: 2023-02-19 | Disposition: A | Discharge status: Home / Self Care | Payer: Medicare Other | Attending: Emergency Medicine | Admitting: Emergency Medicine

## 2023-02-19 ENCOUNTER — Other Ambulatory Visit: Payer: Self-pay

## 2023-02-19 DIAGNOSIS — M25512 Pain in left shoulder: Secondary | ICD-10-CM | POA: Diagnosis not present

## 2023-02-19 DIAGNOSIS — Z91040 Latex allergy status: Secondary | ICD-10-CM | POA: Insufficient documentation

## 2023-02-19 DIAGNOSIS — Z7984 Long term (current) use of oral hypoglycemic drugs: Secondary | ICD-10-CM | POA: Insufficient documentation

## 2023-02-19 MED ORDER — OXYCODONE HCL 5 MG PO TABS: 2.5000 mg | ORAL_TABLET | Freq: Four times a day (QID) | ORAL | 0 refills | Status: DC | PRN

## 2023-02-19 NOTE — Discharge Instructions (Addendum)
Contact a health care provider if: Your pain is not relieved with medicines. New pain develops in your arm, hand, or fingers. You loosen your sling and your arm, hand, or fingers remain tingly, numb, swollen, or painful. Get help right away if: Your arm, hand, or fingers turn white or blue.
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
## 2023-02-23 DIAGNOSIS — M25641 Stiffness of right hand, not elsewhere classified: Secondary | ICD-10-CM | POA: Diagnosis not present

## 2023-02-27 DIAGNOSIS — M25512 Pain in left shoulder: Secondary | ICD-10-CM | POA: Diagnosis not present

## 2023-02-27 DIAGNOSIS — M25511 Pain in right shoulder: Secondary | ICD-10-CM | POA: Diagnosis not present

## 2023-02-28 ENCOUNTER — Ambulatory Visit (INDEPENDENT_AMBULATORY_CARE_PROVIDER_SITE_OTHER): Payer: Medicare Other | Admitting: Medical

## 2023-02-28 ENCOUNTER — Telehealth: Payer: Self-pay | Admitting: Emergency Medicine

## 2023-02-28 VITALS — BP 130/70 | HR 76 | Wt 141.2 lb

## 2023-02-28 DIAGNOSIS — E1169 Type 2 diabetes mellitus with other specified complication: Secondary | ICD-10-CM

## 2023-02-28 DIAGNOSIS — M720 Palmar fascial fibromatosis [Dupuytren]: Secondary | ICD-10-CM | POA: Diagnosis not present

## 2023-02-28 DIAGNOSIS — E1165 Type 2 diabetes mellitus with hyperglycemia: Secondary | ICD-10-CM

## 2023-02-28 DIAGNOSIS — Z1211 Encounter for screening for malignant neoplasm of colon: Secondary | ICD-10-CM | POA: Diagnosis not present

## 2023-02-28 DIAGNOSIS — F17200 Nicotine dependence, unspecified, uncomplicated: Secondary | ICD-10-CM | POA: Diagnosis not present

## 2023-02-28 DIAGNOSIS — E782 Mixed hyperlipidemia: Secondary | ICD-10-CM | POA: Diagnosis not present

## 2023-02-28 LAB — POCT URINALYSIS DIP (PROADVANTAGE DEVICE)
Bilirubin, UA: NEGATIVE
Glucose, UA: >=1000 mg/dL — AB
Ketones, POC UA: NEGATIVE mg/dL
Leukocytes, UA: NEGATIVE
Nitrite, UA: NEGATIVE
Protein Ur, POC: NEGATIVE mg/dL
Specific Gravity, Urine: 1.025
Urobilinogen, Ur: NEGATIVE
pH, UA: 6 (ref 5.0–8.0)

## 2023-02-28 NOTE — Progress Notes (Signed)
Subjective:  Bailey [name] is a 67 y.o. female who presents for Chief Complaint  Patient presents with   get established.    Has DM and would like a referral to Endocrinology, sees [facility] for shoulder pain, had cpe back in 06/2022     Medical team: Eye doctor, Dr. Satira Sark Dr. Terrence Dupont, cardiology Dr. Justice Britain, orthopedics Dr. Iran Planas, orthopedics/hand surgery Dr. Lorenda Peck, podiatry  Concerns: Here as a new patient to establish care.  She has history diabetes, dyslipidemia, arthritis, vitam D deficiency  Not currently seeing diabetes specialist but needs referral to new one.  Has numerous drug allergies.   Has diabetes - uses glcometer, but also has Dexcom 7 device.  Hasn't used Dexcom recently due to some left shoulder pains.  She has been good about keeping her blood sugar readings and most of them are in the less than 130 fasting range.  I reviewed her numbers from the last month  is a smoker, x 25 years, 1/2ppd.   No alcohol or drugs.  Walks for exercise.   Has hx/o duputytrens contracture, had surgery recently 11/2022.  Has f/u with ortho this month again.  No other aggravating or relieving factors.    No other c/o.  Past Medical History:  Diagnosis Date   Arthritis    RIGHT shoulder, knees   Cervical radiculopathy at C5    Dupuytren's contracture of both hands    L > R   Dyslipidemia    History of adenomatous polyp of colon    1997 and 2003     History of chronic pelvic inflammatory disease    History of gastric ulcer    2008   Hypertriglyceridemia    Right carpal tunnel syndrome    Type 2 diabetes mellitus, uncontrolled    last A1c 8.9 on 07-07-2016   Vitamin D deficiency    Current Outpatient Medications on File Prior to Visit  Medication Sig Dispense Refill   atorvastatin (LIPITOR) 40 MG tablet TAKE 1 TABLET BY MOUTH EVERY DAY 90 tablet 0   calcium-vitamin D (OSCAL WITH D) 500-5 MG-MCG tablet Take 1 tablet by mouth.      Continuous Blood Gluc Receiver (DEXCOM G7 RECEIVER) DEVI UAD 1 each 0   Continuous Blood Gluc Sensor (DEXCOM G7 SENSOR) MISC Use to check blood sugar three times daily. Change sensors once every 10 days. 3 each 3   empagliflozin (JARDIANCE) 10 MG TABS tablet Take 1 tablet (10 mg total) by mouth daily. 90 tablet 3   gabapentin (NEURONTIN) 300 MG capsule TAKE 1 CAPSULE(300 MG) BY MOUTH AT BEDTIME 90 capsule 1   glimepiride (AMARYL) 4 MG tablet Take 1 tablet (4 mg total) by mouth daily with breakfast. 90 tablet 3   LANTUS SOLOSTAR 100 UNIT/ML Solostar Pen Inject 32 Units into the skin at bedtime.     lidocaine (LIDODERM) 5 % Place 1 patch onto the skin daily. Remove & Discard patch within 12 hours or as directed by MD 30 patch 0   Multiple Vitamins-Minerals (MULTIVITAMIN WITH MINERALS) tablet Take 1 tablet by mouth every other day.      oxyCODONE (ROXICODONE) 5 MG immediate release tablet Take 0.5-1 tablets (2.5-5 mg total) by mouth every 6 (six) hours as needed for severe pain. 10 tablet 0   sitaGLIPtin (JANUVIA) 100 MG tablet TAKE 1 TABLET BY MOUTH EVERY DAY 90 tablet 0   Blood Glucose Monitoring Suppl (ONETOUCH VERIO) w/Device KIT Use to check blood sugar daily. 1 kit 0  Glucagon, rDNA, (GLUCAGON EMERGENCY) 1 MG KIT Inj 1 mg IM as needed for significant low blood sugar episode 1 kit 0   glucose blood (ONETOUCH VERIO) test strip Use as instructed to check blood sugar daily. (Patient not taking: Reported on 02/28/2023) 100 each 11   Insulin Pen Needle (BD PEN NEEDLE NANO U/F) 32G X 4 MM MISC Use once a day with insulin pen (Patient not taking: Reported on 11/08/2022) 100 each 6   Lancet Devices (ACCU-CHEK SOFTCLIX) lancets Use as instructed (Patient not taking: Reported on 02/28/2023) 1 each 0   OneTouch Delica Lancets [id number] MISC Use to check blood sugar daily. 100 each 11   No current facility-administered medications on file prior to visit.     The following portions of the patient's history were  reviewed and updated as appropriate: allergies, current medications, past family history, past medical history, past social history, past surgical history and problem list.  ROS Otherwise as in subjective above  Objective: BP 130/70   Pulse 76   Wt 141 lb 3.2 oz (64 kg)   BMI 25.83 kg/m   General appearance: alert, no distress, well developed, well nourished Neck: supple, no lymphadenopathy, no thyromegaly, no masses, no bruits Heart: RRR, normal S1, S2, no murmurs Lungs: CTA bilaterally, no wheezes, rhonchi, or rales Pulses: 2+ radial pulses, 2+ pedal pulses, normal cap refill Ext: no edema  Diabetic Foot Exam - Simple   Simple Foot Form Diabetic Foot exam was performed with the following findings: Yes 02/28/2023 10:45 AM  Visual Inspection See comments: Yes Sensation Testing Intact to touch and monofilament testing bilaterally: Yes Pulse Check See comments: Yes Comments 1+ pedal pulses, normal sensation to the palmar she does have some hypertrophic toenails of the great toe and second toe bilateral      Assessment: Encounter Diagnoses  Name Primary?   Uncontrolled type 2 diabetes mellitus with hyperglycemia (HCC) Yes   Dyslipidemia with low high density lipoprotein (HDL) cholesterol with hypertriglyceridemia due to type 2 diabetes mellitus (HCC)    Tobacco dependence    Dupuytren's contracture of both hands    Screen for colon cancer      Plan: Diabetes Updated labs today Counseled on daily foot checks, yearly eye doctor visit, routine glucose testing, healthy diet and exercise Continue current medications: Continue Jardiance 10 mg daily, glimepiride 4 mg daily, Lantus 32 units daily, Januvia 100 mg daily  Dyslipidemia-continue Lipitor 40 mg daily, labs today  Tobacco use-I recommended she consider cessation  Dupuytren's contracture with recent surgery-follow-up with orthopedics this month as planned  Screen for colon cancer-she has a schedule for colonoscopy  closer to summer this year  Dillon was seen today for get established..  Diagnoses and all orders for this visit:  Uncontrolled type 2 diabetes mellitus with hyperglycemia ([place]) -     Hemoglobin A1c -     Comprehensive metabolic panel  Dyslipidemia with low high density lipoprotein (HDL) cholesterol with hypertriglyceridemia due to type 2 diabetes mellitus (HCC) -     Lipid panel  Tobacco dependence  Dupuytren's contracture of both hands  Screen for colon cancer    Follow up: pending labs

## 2023-02-28 NOTE — Addendum Note (Signed)
Addended by: Minette Headland A on: 02/28/2023 11:32 AM   Modules accepted: Orders

## 2023-02-28 NOTE — Telephone Encounter (Signed)
Copied from [place] [id number]. Topic: General - Other >> Feb 28, 2023 11:55 AM Chapman Fitch wrote: Reason for CRM: Pt has found a new pCP and will no longer be a pt of Dr. Wynetta Emery / pt wanted office to know

## 2023-03-01 ENCOUNTER — Encounter: Payer: Self-pay | Admitting: Internal Medicine

## 2023-03-01 ENCOUNTER — Other Ambulatory Visit: Payer: Self-pay | Admitting: Medical

## 2023-03-01 LAB — LIPID PANEL
Chol/HDL Ratio: 2.2 ratio (ref 0.0–4.4)
Cholesterol, Total: 106 mg/dL (ref 100–199)
HDL: 48 mg/dL (ref >39)
LDL Chol Calc (NIH): 42 mg/dL (ref 0–99)
Triglycerides: 76 mg/dL (ref 0–149)
VLDL Cholesterol Cal: 16 mg/dL (ref 5–40)

## 2023-03-01 LAB — COMPREHENSIVE METABOLIC PANEL
ALT: 25 IU/L (ref 0–32)
AST: 18 IU/L (ref 0–40)
Albumin/Globulin Ratio: 1.6 (ref 1.2–2.2)
Albumin: 4.4 g/dL (ref 3.9–4.9)
Alkaline Phosphatase: 81 IU/L (ref 44–121)
BUN/Creatinine Ratio: 14 (ref 12–28)
BUN: 11 mg/dL (ref 8–27)
Bilirubin Total: 0.3 mg/dL (ref 0.0–1.2)
CO2: 22 mmol/L (ref 20–29)
Calcium: 9.3 mg/dL (ref 8.7–10.3)
Chloride: 108 mmol/L — ABNORMAL HIGH (ref 96–106)
Creatinine, Ser: 0.79 mg/dL (ref 0.57–1.00)
Globulin, Total: 2.7 g/dL (ref 1.5–4.5)
Glucose: 106 mg/dL — ABNORMAL HIGH (ref 70–99)
Potassium: 4 mmol/L (ref 3.5–5.2)
Sodium: 146 mmol/L — ABNORMAL HIGH (ref 134–144)
Total Protein: 7.1 g/dL (ref 6.0–8.5)
eGFR: 81 mL/min/{1.73_m2} (ref >59)

## 2023-03-01 LAB — HEMOGLOBIN A1C
Est. average glucose Bld gHb Est-mCnc: 183 mg/dL
Hgb A1c MFr Bld: 8 % — ABNORMAL HIGH (ref 4.8–5.6)

## 2023-03-01 MED ORDER — SEMAGLUTIDE(0.25 OR 0.5MG/DOS) 2 MG/3ML ~~LOC~~ SOPN: 0.2500 mg | PEN_INJECTOR | SUBCUTANEOUS | 1 refills | Status: DC

## 2023-03-01 NOTE — Progress Notes (Signed)
Kidney and liver ok.  Cholesterol ok, but diabetes marker is 8%, too high.  Have you been on any of the weekly injectables like Mounjaro, ozempic, Trulicity?   I think this would work better than your [place] and possibly the glimepiride.  In other words, if agreeable to start [facility] for example, we could for the time being stop those 2 separate medications, Januvia and Glimepiride.  You would continue Jardiance and Lantus, but increase Lantus to 35 units.    If agreeable, I'll send one of these weekly medications to the pharmacy to try.

## 2023-03-02 DIAGNOSIS — M25641 Stiffness of right hand, not elsewhere classified: Secondary | ICD-10-CM | POA: Diagnosis not present

## 2023-03-08 DIAGNOSIS — M25641 Stiffness of right hand, not elsewhere classified: Secondary | ICD-10-CM | POA: Diagnosis not present

## 2023-03-09 DIAGNOSIS — M720 Palmar fascial fibromatosis [Dupuytren]: Secondary | ICD-10-CM | POA: Diagnosis not present

## 2023-03-13 ENCOUNTER — Ambulatory Visit: Payer: Medicare Other | Admitting: Internal Medicine

## 2023-03-13 DIAGNOSIS — M25512 Pain in left shoulder: Secondary | ICD-10-CM | POA: Diagnosis not present

## 2023-03-17 ENCOUNTER — Other Ambulatory Visit: Payer: Self-pay | Admitting: Internal Medicine

## 2023-03-17 DIAGNOSIS — E1169 Type 2 diabetes mellitus with other specified complication: Secondary | ICD-10-CM

## 2023-03-17 NOTE — Telephone Encounter (Signed)
Unable to refill per protocol, Rx request is too soon. Last refill 05/26/22 for 90 days and 3 refills.  Requested Prescriptions  Pending Prescriptions Disp Refills   JARDIANCE 10 MG TABS tablet [Pharmacy Med Name: JARDIANCE 10 MG TABLET] 90 tablet 3    Sig: TAKE 1 TABLET BY MOUTH [place] DAY     Endocrinology:  Diabetes - SGLT2 Inhibitors Failed - 03/17/2023 12:15 AM      Failed - HBA1C is between 0 and 7.9 and within 180 days    HbA1c, POC (controlled diabetic range)  Date Value Ref Range Status  11/08/2022 7.8 (A) 0.0 - 7.0 % Final   Hgb A1c MFr Bld  Date Value Ref Range Status  02/28/2023 8.0 (H) 4.8 - 5.6 % Final    Comment:             Prediabetes: 5.7 - 6.4          Diabetes: >6.4          Glycemic control for adults with diabetes: <7.0          Passed - Cr in normal range and within 360 days    Creat  Date Value Ref Range Status  07/24/2014 0.84 0.50 - 1.10 mg/dL Final   Creatinine, Ser  Date Value Ref Range Status  02/28/2023 0.79 0.57 - 1.00 mg/dL Final   Creatinine, Urine  Date Value Ref Range Status  11/12/2015 115 20 - 320 mg/dL Final         Passed - eGFR in normal range and within 360 days    GFR calc Af Amer  Date Value Ref Range Status  08/21/2019 108 >59 mL/min/1.73 Final   GFR calc non Af Amer  Date Value Ref Range Status  08/21/2019 93 >59 mL/min/1.73 Final   eGFR  Date Value Ref Range Status  02/28/2023 81 >59 mL/min/1.73 Final         Passed - Valid encounter within last 6 months    Recent Outpatient Visits           4 months ago Type 2 diabetes mellitus with hypoglycemia without coma, with long-term current use of insulin ([place])   [place] Ladell Pier, MD   8 months ago Medication management   [place], Stephen L, RPH-CPP   8 months ago Encounter for [employer] annual wellness exam   [place] Karle Plumber B,  MD   9 months ago Type 2 diabetes mellitus with hyperlipidemia [facility])   [place] Karle Plumber B, MD   1 year ago Type 2 diabetes mellitus with hyperlipidemia [facility])   [place] Ladell Pier, MD

## 2023-03-22 DIAGNOSIS — M25512 Pain in left shoulder: Secondary | ICD-10-CM | POA: Diagnosis not present

## 2023-04-03 ENCOUNTER — Other Ambulatory Visit: Payer: Self-pay | Admitting: Internal Medicine

## 2023-04-04 NOTE — Telephone Encounter (Signed)
Pt has new PCP as of 02/28/23.  Requested Prescriptions  Pending Prescriptions Disp Refills   Continuous Blood Gluc Sensor (DEXCOM G7 SENSOR) MISC [Pharmacy Med Name: DEXCOM G7 SENSOR] 3 each 3    Sig: USE TO CHECK BLOOD SUGAR THREE TIMES DAILY. CHANGE SENSORS ONCE EVERY 10 DAYS.     Endocrinology: Diabetes - Testing Supplies Passed - 04/03/2023 12:15 AM      Passed - Valid encounter within last 12 months    Recent Outpatient Visits           4 months ago Type 2 diabetes mellitus with hypoglycemia without coma, with long-term current use of insulin [facility])   [place] [facility] Marcine Matar, MD   9 months ago Medication management   [facility] [place], Cornelius Moras, RPH-CPP   9 months ago Encounter for [employer] annual wellness exam   [place] [facility] Jonah Blue B, MD   10 months ago Type 2 diabetes mellitus with hyperlipidemia [facility])   [place] [facility] Marcine Matar, MD   1 year ago Type 2 diabetes mellitus with hyperlipidemia [facility])   [place] [facility] Marcine Matar, MD

## 2023-04-13 ENCOUNTER — Other Ambulatory Visit: Payer: Self-pay | Admitting: *Deleted

## 2023-04-13 ENCOUNTER — Telehealth: Payer: Self-pay | Admitting: Medical

## 2023-04-13 ENCOUNTER — Other Ambulatory Visit: Payer: Self-pay

## 2023-04-13 ENCOUNTER — Other Ambulatory Visit: Payer: Self-pay | Admitting: Internal Medicine

## 2023-04-13 DIAGNOSIS — E1169 Type 2 diabetes mellitus with other specified complication: Secondary | ICD-10-CM

## 2023-04-13 DIAGNOSIS — E1136 Type 2 diabetes mellitus with diabetic cataract: Secondary | ICD-10-CM

## 2023-04-13 MED ORDER — GLIMEPIRIDE 4 MG PO TABS: 4.0000 mg | ORAL_TABLET | Freq: Every day | ORAL | 0 refills | Status: DC

## 2023-04-13 MED ORDER — DEXCOM G7 SENSOR MISC: 3 refills | Status: DC

## 2023-04-13 MED ORDER — SITAGLIPTIN PHOSPHATE 100 MG PO TABS: ORAL_TABLET | ORAL | 0 refills | Status: DC

## 2023-04-13 MED ORDER — EMPAGLIFLOZIN 10 MG PO TABS: 10.0000 mg | ORAL_TABLET | Freq: Every day | ORAL | 0 refills | Status: DC

## 2023-04-13 MED ORDER — LANTUS SOLOSTAR 100 UNIT/ML ~~LOC~~ SOPN: 32.0000 [IU] | PEN_INJECTOR | Freq: Every day | SUBCUTANEOUS | 0 refills | Status: DC

## 2023-04-13 MED ORDER — SEMAGLUTIDE(0.25 OR 0.5MG/DOS) 2 MG/3ML ~~LOC~~ SOPN: 0.2500 mg | PEN_INJECTOR | SUBCUTANEOUS | 1 refills | Status: DC

## 2023-04-13 MED ORDER — ATORVASTATIN CALCIUM 40 MG PO TABS: 40.0000 mg | ORAL_TABLET | Freq: Every day | ORAL | 0 refills | Status: DC

## 2023-04-13 NOTE — Telephone Encounter (Signed)
Bailey [name] needs a refill on Atorvastatin, Januvia, Lantus Solostar, Jardiance, Ozempic, Glimepiride, and Dexcom G7 sensors sent to  [id number] - [place], [place] - [address]

## 2023-04-18 ENCOUNTER — Telehealth: Payer: Self-pay | Admitting: Medical

## 2023-04-18 ENCOUNTER — Other Ambulatory Visit: Payer: Self-pay | Admitting: Medical

## 2023-04-18 MED ORDER — MELOXICAM 15 MG PO TABS: 15.0000 mg | ORAL_TABLET | Freq: Every day | ORAL | 0 refills | Status: DC

## 2023-04-18 NOTE — Telephone Encounter (Signed)
Bailey [name] requesting a refill on Meloxicam 15 mg to [id number] - [place], [place] - [address]

## 2023-04-20 DIAGNOSIS — H5203 Hypermetropia, bilateral: Secondary | ICD-10-CM | POA: Diagnosis not present

## 2023-04-20 DIAGNOSIS — H25013 Cortical age-related cataract, bilateral: Secondary | ICD-10-CM | POA: Diagnosis not present

## 2023-04-20 DIAGNOSIS — E119 Type 2 diabetes mellitus without complications: Secondary | ICD-10-CM | POA: Diagnosis not present

## 2023-04-20 DIAGNOSIS — H524 Presbyopia: Secondary | ICD-10-CM | POA: Diagnosis not present

## 2023-04-20 DIAGNOSIS — H2513 Age-related nuclear cataract, bilateral: Secondary | ICD-10-CM | POA: Diagnosis not present

## 2023-04-20 LAB — HM DIABETES EYE EXAM

## 2023-04-21 ENCOUNTER — Encounter: Payer: Self-pay | Admitting: *Deleted

## 2023-04-28 DIAGNOSIS — H43821 Vitreomacular adhesion, right eye: Secondary | ICD-10-CM | POA: Diagnosis not present

## 2023-05-04 DIAGNOSIS — H2512 Age-related nuclear cataract, left eye: Secondary | ICD-10-CM | POA: Diagnosis not present

## 2023-05-04 DIAGNOSIS — H25812 Combined forms of age-related cataract, left eye: Secondary | ICD-10-CM | POA: Diagnosis not present

## 2023-05-04 DIAGNOSIS — H25012 Cortical age-related cataract, left eye: Secondary | ICD-10-CM | POA: Diagnosis not present

## 2023-05-04 HISTORY — PX: CATARACT EXTRACTION: SUR2

## 2023-05-09 DIAGNOSIS — M720 Palmar fascial fibromatosis [Dupuytren]: Secondary | ICD-10-CM | POA: Diagnosis not present

## 2023-05-18 ENCOUNTER — Other Ambulatory Visit: Payer: Self-pay | Admitting: Medical

## 2023-05-18 NOTE — Telephone Encounter (Signed)
Refill request last apt 02/28/23

## 2023-05-24 ENCOUNTER — Ambulatory Visit
Admission: RE | Admit: 2023-05-24 | Discharge: 2023-05-24 | Disposition: A | Discharge status: Home / Self Care | Payer: Medicare Other | Source: Ambulatory Visit | Attending: Internal Medicine | Admitting: Internal Medicine

## 2023-05-24 ENCOUNTER — Other Ambulatory Visit: Payer: Self-pay | Admitting: Internal Medicine

## 2023-05-24 DIAGNOSIS — R928 Other abnormal and inconclusive findings on diagnostic imaging of breast: Secondary | ICD-10-CM

## 2023-05-24 DIAGNOSIS — N6489 Other specified disorders of breast: Secondary | ICD-10-CM

## 2023-05-25 ENCOUNTER — Other Ambulatory Visit: Payer: Self-pay | Admitting: Internal Medicine

## 2023-05-25 DIAGNOSIS — R928 Other abnormal and inconclusive findings on diagnostic imaging of breast: Secondary | ICD-10-CM

## 2023-05-25 NOTE — Progress Notes (Signed)
See results, and please order diagnostic bilat mammogram for  26mo

## 2023-05-27 ENCOUNTER — Other Ambulatory Visit: Payer: Self-pay | Admitting: Medical

## 2023-05-30 ENCOUNTER — Telehealth: Payer: Self-pay | Admitting: Medical

## 2023-05-30 NOTE — Telephone Encounter (Signed)
Lantus rejecting at pharmacy completed P.A.  PA Case ID #: [id number] Outcome This medication or product is on your plan's list of covered drugs. Prior authorization is not required at this time. If your pharmacy has questions regarding the processing of your prescription, please have them call the [facility] pharmacy help desk at [phone]. **Please note: This request was submitted electronically. Providers contact us at [phone] for further assistance. Drug Lantus SoloStar 100UNIT/ML pen-injectors Form [facility] Medicare Part D Electronic Prior

## 2023-05-31 ENCOUNTER — Other Ambulatory Visit: Payer: Self-pay | Admitting: Medical

## 2023-05-31 ENCOUNTER — Telehealth: Payer: Self-pay | Admitting: Medical

## 2023-05-31 ENCOUNTER — Ambulatory Visit (INDEPENDENT_AMBULATORY_CARE_PROVIDER_SITE_OTHER): Payer: Medicare Other | Admitting: Medical

## 2023-05-31 VITALS — BP 118/72 | HR 56 | Wt 137.7 lb

## 2023-05-31 DIAGNOSIS — E1165 Type 2 diabetes mellitus with hyperglycemia: Secondary | ICD-10-CM

## 2023-05-31 DIAGNOSIS — H9190 Unspecified hearing loss, unspecified ear: Secondary | ICD-10-CM

## 2023-05-31 DIAGNOSIS — Z1211 Encounter for screening for malignant neoplasm of colon: Secondary | ICD-10-CM

## 2023-05-31 DIAGNOSIS — E785 Hyperlipidemia, unspecified: Secondary | ICD-10-CM | POA: Diagnosis not present

## 2023-05-31 DIAGNOSIS — B351 Tinea unguium: Secondary | ICD-10-CM | POA: Diagnosis not present

## 2023-05-31 DIAGNOSIS — F17200 Nicotine dependence, unspecified, uncomplicated: Secondary | ICD-10-CM

## 2023-05-31 DIAGNOSIS — H9319 Tinnitus, unspecified ear: Secondary | ICD-10-CM | POA: Diagnosis not present

## 2023-05-31 DIAGNOSIS — Z800 Family history of malignant neoplasm of digestive organs: Secondary | ICD-10-CM

## 2023-05-31 DIAGNOSIS — H6122 Impacted cerumen, left ear: Secondary | ICD-10-CM | POA: Diagnosis not present

## 2023-05-31 DIAGNOSIS — E1169 Type 2 diabetes mellitus with other specified complication: Secondary | ICD-10-CM

## 2023-05-31 MED ORDER — LANTUS SOLOSTAR 100 UNIT/ML ~~LOC~~ SOPN: 33.0000 [IU] | PEN_INJECTOR | Freq: Every day | SUBCUTANEOUS | 1 refills | Status: DC

## 2023-05-31 MED ORDER — CICLOPIROX 8 % EX SOLN: Freq: Every day | CUTANEOUS | 5 refills | Status: AC

## 2023-05-31 MED ORDER — EMPAGLIFLOZIN 10 MG PO TABS: 10.0000 mg | ORAL_TABLET | Freq: Every day | ORAL | 0 refills | Status: DC

## 2023-05-31 NOTE — Telephone Encounter (Signed)
PA Case ID #: [id number] Outcome Additional Information Required This medication or product is on your plan's list of covered drugs. Prior authorization is not required at this time. If your pharmacy has questions regarding the processing of your prescription, please have them call the [facility] pharmacy help desk at [phone].  Providers contact us at [phone] for further assistance. Drug Lantus SoloStar 100UNIT/ML pen-injectors ePA cloud logo Form [facility] Medicare Part D Electronic Prior Authorization Form (2017 NCPDP) Called pharmacy Rx went thru,  called pt informed

## 2023-05-31 NOTE — Telephone Encounter (Signed)
Lantus going thru now

## 2023-05-31 NOTE — Telephone Encounter (Signed)
Pt called and would like her lantus and her jardiance all a 90 day supply so she does not have to keep going back and fourth to the pharmacy pt uses  [id number] - [place], [place] - [address]

## 2023-05-31 NOTE — Progress Notes (Signed)
Subjective:  Bailey [name] is a 67 y.o. female who presents for Chief Complaint  Patient presents with   Medical Management of Chronic Issues    3 month follow-up, weak legs sometimes, hot flashes with night sweats, still having ringing in ear, having a lot of gas, darkness of toenails. Would like to get colonoscopy,      Medical team: Eye doctor, Dr. Burgess Estelle Dr. Sharyn Lull, cardiology Dr. Francena Hanly, orthopedics Dr. Bradly Bienenstock, orthopedics/hand surgery Dr. Louann Sjogren, podiatry Dr. Marsa Aris, GI   Concerns: Here for follow up.  I saw her as a new patient in March. Last visit we continued Jardiance and Lantus, stopped Januvia and Glimepiride and added Ozempic low dose.  She is doing fine on ozempic.  Recent sugars she brought in range 70-130 and has had a few hypoglycemia alerts at home in last month.    Uses Dexcom.    Getting some ringing in ears, x months.  Has allergies in general, does get allergy symptoms. Uses zyrtec already.  Considering ENT consult.    Had left cataract removed 05/04/2023.  Needs updated referral to GI for updated colonoscopy.  Due at this time.    Has numerous drug allergies  is a smoker, x 25 years, 1/2ppd.   No alcohol or drugs.  Compliant with cholesterol medication  Has concerns about toenails discolored.  Was curious about using medication including cream topically or other.     No other aggravating or relieving factors.    No other c/o.  Past Medical History:  Diagnosis Date   Arthritis    RIGHT shoulder, knees   Cervical radiculopathy at C5    Dupuytren's contracture of both hands    L > R   Dyslipidemia    History of adenomatous polyp of colon    1997 and 2003     History of chronic pelvic inflammatory disease    History of gastric ulcer    2008   Hypertriglyceridemia    Right carpal tunnel syndrome    Type 2 diabetes mellitus, uncontrolled    last A1c 8.9 on 07-07-2016   Vitamin D deficiency    Current  Outpatient Medications on File Prior to Visit  Medication Sig Dispense Refill   atorvastatin (LIPITOR) 40 MG tablet Take 1 tablet (40 mg total) by mouth daily. 90 tablet 0   calcium-vitamin D (OSCAL WITH D) 500-5 MG-MCG tablet Take 1 tablet by mouth.     Continuous Blood Gluc Receiver (DEXCOM G7 RECEIVER) DEVI UAD 1 each 0   Continuous Glucose Sensor (DEXCOM G7 SENSOR) MISC Use to check blood sugar three times daily. Change sensors once every 10 days. 3 each 3   empagliflozin (JARDIANCE) 10 MG TABS tablet Take 1 tablet (10 mg total) by mouth daily. 90 tablet 0   gabapentin (NEURONTIN) 300 MG capsule TAKE 1 CAPSULE(300 MG) BY MOUTH AT BEDTIME 90 capsule 1   Glucagon, rDNA, (GLUCAGON EMERGENCY) 1 MG KIT Inj 1 mg IM as needed for significant low blood sugar episode 1 kit 0   Insulin Pen Needle (BD PEN NEEDLE NANO U/F) 32G X 4 MM MISC Use once a day with insulin pen 100 each 6   lidocaine (LIDODERM) 5 % Place 1 patch onto the skin daily. Remove & Discard patch within 12 hours or as directed by MD 30 patch 0   meloxicam (MOBIC) 15 MG tablet TAKE 1 TABLET (15 MG TOTAL) BY MOUTH DAILY. 30 tablet 0   Multiple Vitamins-Minerals (MULTIVITAMIN WITH  MINERALS) tablet Take 1 tablet by mouth every other day.      Semaglutide,0.25 or 0.5MG /DOS, 2 MG/3ML SOPN Inject 0.25 mg into the skin once a week. 3 mL 1   LANTUS SOLOSTAR 100 UNIT/ML Solostar Pen Inject 33 Units into the skin at bedtime. 15 mL 1   No current facility-administered medications on file prior to visit.     The following portions of the patient's history were reviewed and updated as appropriate: allergies, current medications, past family history, past medical history, past social history, past surgical history and problem list.  ROS Otherwise as in subjective above     Objective: BP 118/72   Pulse (!) 56   Wt 137 lb 11.2 oz (62.5 kg)   BMI 25.19 kg/m   Wt Readings from Last 3 Encounters:  05/31/23 137 lb 11.2 oz (62.5 kg)   02/28/23 141 lb 3.2 oz (64 kg)  02/19/23 145 lb (65.8 kg)   General appearance: alert, no distress, well developed, well nourished Neck: supple, no lymphadenopathy, left ear canal with impact cerumen, right ear canal and TM normal, no thyromegaly, no masses, no bruits Heart: RRR, normal S1, S2, no murmurs Lungs: CTA bilaterally, no wheezes, rhonchi, or rales Pulses: 2+ radial pulses, 2+ pedal pulses, normal cap refill Ext: no edema She has several toenails that are darker discolored, and left great toenail thickening     Assessment: Encounter Diagnoses  Name Primary?   Uncontrolled type 2 diabetes mellitus with hyperglycemia (HCC) Yes   Family history of colon cancer    Screening for colon cancer    Onychomycosis    Tinnitus, unspecified laterality    Tobacco dependence    Hyperlipidemia, unspecified hyperlipidemia type    Hearing loss, unspecified hearing loss type, unspecified laterality    Impacted cerumen of left ear [H61.22]       Plan: Diabetes type 2 Counseled on daily foot checks, yearly eye doctor visit, routine glucose testing, healthy diet and exercise Continue current medications: Continue Jardiance 10 mg daily, Ozempic at 0.25mg  weekly and cut back to Lantus 25 units daily In march we discontinued Januvia 100 mg and Glimepiride since we added Ozempic.   02/2023 HgbA1C was 8%, but in the meantime, sugars are ranging 70-130s.    We want to avoid low sugar, hypoglycemia If you start seeing numbers stay close to 70, then cut back on Lantus even more such as 5 less units daily or call us to discuss   Screen for colon cancer, family hx/o colon cancer Referral back to Dr. Lavon Paganini, last colonoscopies from 2017 and 2019 reviewed   Dyslipidemia-continue Lipitor 40 mg daily, labs today   Onychomycosis - discussed options for treatment.  She wants to try OTC antifungal cream for now instead of oral given risks.   Discussed long term nature of the  treatment.   Tobacco use-I recommended she consider cessation   [place], impacted cerumen left ear Hearing test abnormal, referral to audiology  Discussed findings.  Discussed risk/benefits of procedure and patient agrees to procedure. Successfully used warm water lavage to remove impacted cerumen from left ear canal. Patient tolerated procedure well. Advised they avoid using any cotton swabs or other devices to clean the ear canals.  Use basic hygiene as discussed.      Dovey was seen today for medical management of chronic issues.  Diagnoses and all orders for this visit:  Uncontrolled type 2 diabetes mellitus with hyperglycemia (HCC)  Family history of colon cancer -  Ambulatory referral to Gastroenterology  Screening for colon cancer -     Ambulatory referral to Gastroenterology  Onychomycosis  Tinnitus, unspecified laterality -     Ambulatory referral to Audiology  Tobacco dependence  Hyperlipidemia, unspecified hyperlipidemia type  Hearing loss, unspecified hearing loss type, unspecified laterality -     Ambulatory referral to Audiology  Impacted cerumen of left ear [H61.22]  Other orders -     ciclopirox (PENLAC) 8 % solution; Apply topically at bedtime. Apply over nail and surrounding skin. Apply daily over previous coat. After seven (7) days, may remove with alcohol and continue cycle.    Follow up: pending labs

## 2023-05-31 NOTE — Addendum Note (Signed)
Addended by: Herminio Commons A on: 05/31/2023 11:01 AM   Modules accepted: Orders

## 2023-05-31 NOTE — Patient Instructions (Signed)
Diabetes type 2 Counseled on daily foot checks, yearly eye doctor visit, routine glucose testing, healthy diet and exercise Continue current medications: Continue Jardiance 10 mg daily, Ozempic at 0.25mg  weekly and cut back to Lantus 25 units daily In march we discontinued Januvia 100 mg and Glimepiride since we added Ozempic.   02/2023 HgbA1C was 8%, but in the meantime, sugars are ranging 70-130s.    We want to avoid low sugar, hypoglycemia If you start seeing numbers stay close to 70, then cut back on Lantus even more such as 5 less units daily or call us to discuss   Screen for colon cancer, family hx/o colon cancer Referral back to Dr. Lavon Paganini, last colonoscopies from 2017 and 2019 reviewed   Dyslipidemia-continue Lipitor 40 mg daily, labs today   Onychomycosis - discussed options for treatment.  She wants to try OTC antifungal cream for now instead of oral given risks.   Discussed long term nature of the treatment.   Tobacco use-I recommended she consider cessation   [place], impacted cerumen left ear Hearing test abnormal Refer to audiology  Discussed findings.  Discussed risk/benefits of procedure and patient agrees to procedure. Successfully used warm water lavage to remove impacted cerumen from left ear canal. Patient tolerated procedure well. Advised they avoid using any cotton swabs or other devices to clean the ear canals.  Use basic hygiene as discussed.

## 2023-06-01 ENCOUNTER — Other Ambulatory Visit: Payer: Self-pay | Admitting: Medical

## 2023-06-15 ENCOUNTER — Other Ambulatory Visit: Payer: Self-pay | Admitting: Medical

## 2023-06-15 NOTE — Telephone Encounter (Signed)
Refill request last apt 05/31/23.

## 2023-06-16 ENCOUNTER — Ambulatory Visit: Payer: Medicare Other | Attending: Medical | Admitting: Audiologist

## 2023-06-16 DIAGNOSIS — H903 Sensorineural hearing loss, bilateral: Secondary | ICD-10-CM | POA: Insufficient documentation

## 2023-06-16 DIAGNOSIS — H9313 Tinnitus, bilateral: Secondary | ICD-10-CM | POA: Diagnosis not present

## 2023-06-16 NOTE — Procedures (Signed)
  Outpatient Audiology and [facility] [address] [place], [place]  [address] [phone]  AUDIOLOGICAL  EVALUATION  NAME: Bailey [name]     DOB:   [birth year]      MRN: [id number]                                                                                     DATE: 06/16/2023     REFERENT: Jac Canavan, PA-C STATUS: Outpatient DIAGNOSIS: Sensorineural Hearing Loss, Bilateral     History: Halcyon was seen for an audiological evaluation. Ticia is receiving a hearing evaluation due to concerns for intermittent ringing in her ears. The ringing occurs randomly lasting from minutes to hours. Terrion has difficulty hearing in noise but over all feels she hears well. This difficulty began gradually.  No pain or pressure reported in either ear. Tinnitus present in both ears.  Medical history positive for diabetes which is a risk factor for hearing loss. No other relevant case history reported.   Evaluation:  Otoscopy showed a clear view of the tympanic membranes, bilaterally Tympanometry results were consistent with normal middle ear function, bilaterally   Audiometric testing was completed using conventional audiometry with supraural transducer. Speech Recognition Thresholds were 35dB in the right ear and 25dB in the left ear. Word Recognition was performed 40dB SL, scored  100% in the right ear and 96% in the left ear. Pure tone thresholds show normal hearing sloping after 2kHz to a severe sensorineural hearing loss in both ears.   Results:  The test results were reviewed with Scarlette Calico. She has a moderate sloping to sever high pitched sensorineural hearing loss. This is the cause of her ringing. Hearing aids are recommended for speech clarity and to lessen awareness of the ringing. Hazell does not feel the loss or ringing are severe enough at this time that she would use aids regularly.    Recommendations: 1.   Recommend annual audiometric testing to  monitor progression of hearing loss.    34 minutes spent testing and counseling on results.   Ammie Ferrier  Audiologist, Au.D., CCC-A 06/16/2023  9:45 AM  Cc: Jac Canavan, PA-C

## 2023-06-18 ENCOUNTER — Other Ambulatory Visit: Payer: Self-pay | Admitting: Medical

## 2023-06-18 DIAGNOSIS — E1169 Type 2 diabetes mellitus with other specified complication: Secondary | ICD-10-CM

## 2023-06-28 ENCOUNTER — Encounter: Payer: Self-pay | Admitting: Medical

## 2023-07-10 ENCOUNTER — Encounter: Payer: Self-pay | Admitting: Gastroenterology

## 2023-07-12 ENCOUNTER — Other Ambulatory Visit: Payer: Self-pay | Admitting: Family Medicine

## 2023-08-01 ENCOUNTER — Ambulatory Visit (INDEPENDENT_AMBULATORY_CARE_PROVIDER_SITE_OTHER): Payer: Medicare Other

## 2023-08-01 VITALS — BP 122/62 | HR 79 | Temp 98.6°F | Ht 61.5 in | Wt 136.2 lb

## 2023-08-01 DIAGNOSIS — Z0000 Encounter for general adult medical examination without abnormal findings: Secondary | ICD-10-CM

## 2023-08-01 NOTE — Progress Notes (Signed)
Subjective:   Bailey [name] is a 67 y.o. female who presents for Medicare Annual (Subsequent) preventive examination.  Visit Complete: In person  Patient Medicare AWV questionnaire was completed by the patient on 07/28/2023; I have confirmed that all information answered by patient is correct and no changes since this date.  Review of Systems     Cardiac Risk Factors include: advanced age (>67men, >63 women);diabetes mellitus     Objective:    Today's Vitals   08/01/23 0932  BP: 122/62  Pulse: 79  Temp: 98.6 F (37 C)  TempSrc: Oral  SpO2: 99%  Weight: 136 lb 3.2 oz (61.8 kg)  Height: 5' 1.5" (1.562 m)   Body mass index is 25.32 kg/m.     08/01/2023    9:43 AM 01/31/2023    7:39 AM 07/05/2022   10:15 AM 03/02/2021    3:29 PM 06/22/2018   10:33 AM 10/05/2017    9:01 AM 08/08/2017    2:43 PM  Advanced Directives  Does Patient Have a Medical Advance Directive? Yes No No;Yes No No No No  Type of Estate agent of [place];Living will        Does patient want to make changes to medical advance directive?   No - Patient declined      Copy of Healthcare Power of Attorney in Chart? No - copy requested        Would patient like information on creating a medical advance directive?  No - Patient declined No - Patient declined Yes (Inpatient - patient defers creating a medical advance directive at this time - Information given)  No - Patient declined No - Patient declined    Current Medications (verified) Outpatient Encounter Medications as of 08/01/2023  Medication Sig   atorvastatin (LIPITOR) 40 MG tablet TAKE 1 TABLET BY MOUTH EVERY DAY   calcium-vitamin D (OSCAL WITH D) 500-5 MG-MCG tablet Take 1 tablet by mouth.   ciclopirox (PENLAC) 8 % solution Apply topically at bedtime. Apply over nail and surrounding skin. Apply daily over previous coat. After seven (7) days, may remove with alcohol and continue cycle.   Continuous Blood Gluc Receiver (DEXCOM  G7 RECEIVER) DEVI UAD   Continuous Glucose Sensor (DEXCOM G7 SENSOR) MISC Use to check blood sugar three times daily. Change sensors once every 10 days.   gabapentin (NEURONTIN) 300 MG capsule TAKE 1 CAPSULE(300 MG) BY MOUTH AT BEDTIME   Glucagon, rDNA, (GLUCAGON EMERGENCY) 1 MG KIT Inj 1 mg IM as needed for significant low blood sugar episode   Insulin Pen Needle (BD PEN NEEDLE NANO U/F) 32G X 4 MM MISC Use once a day with insulin pen   LANTUS SOLOSTAR 100 UNIT/ML Solostar Pen Inject 33 Units into the skin at bedtime.   Multiple Vitamins-Minerals (MULTIVITAMIN WITH MINERALS) tablet Take 1 tablet by mouth every other day.    Semaglutide,0.25 or 0.5MG /DOS, (OZEMPIC, 0.25 OR 0.5 MG/DOSE,) 2 MG/3ML SOPN INJECT 0.25MG  INTO THE SKIN ONE TIME PER WEEK   empagliflozin (JARDIANCE) 10 MG TABS tablet Take 1 tablet (10 mg total) by mouth daily.   lidocaine (LIDODERM) 5 % Place 1 patch onto the skin daily. Remove & Discard patch within 12 hours or as directed by MD (Patient not taking: Reported on 08/01/2023)   meloxicam (MOBIC) 15 MG tablet TAKE 1 TABLET (15 MG TOTAL) BY MOUTH DAILY. (Patient not taking: Reported on 08/01/2023)   No facility-administered encounter medications on file as of 08/01/2023.    Allergies (verified)  Metformin, Metformin and related, Shellfish allergy, Ivp dye [iodinated contrast media], Adhesive [tape], Chantix [varenicline], Codeine, Hydrocodone-acetaminophen, Hydrocodone-acetaminophen, Penicillin g, Varenicline tartrate, Latex, and Penicillins   History: Past Medical History:  Diagnosis Date   Arthritis    RIGHT shoulder, knees   Cervical radiculopathy at C5    Dupuytren's contracture of both hands    L > R   Dyslipidemia    History of adenomatous polyp of colon    1997 and 2003     History of chronic pelvic inflammatory disease    History of gastric ulcer    2008   Hypertriglyceridemia    Right carpal tunnel syndrome    Type 2 diabetes mellitus, uncontrolled    last A1c  8.9 on 07-07-2016   Vitamin D deficiency    Past Surgical History:  Procedure Laterality Date   CATARACT EXTRACTION Left 05/04/2023   COLONOSCOPY  last one 07-15-2016   DUPUYTREN / PALMAR FASCIOTOMY Right    2023   ESOPHAGOGASTRODUODENOSCOPY  last one 2008   FASCIECTOMY Left 08/24/2016   Procedure: LEFT HAND PALMER AND SMALL FINGER DIGiTAL FASCIECTOMY AND RELEASE;  Surgeon: Bradly Bienenstock, MD;  Location: [facility];  Service: Orthopedics;  Laterality: Left;   LAPAROSCOPIC CHOLECYSTECTOMY  07/17/2000   ROTATOR CUFF REPAIR Right 2000   TOTAL ABDOMINAL HYSTERECTOMY W/ BILATERAL SALPINGOOPHORECTOMY  1998   Family History  Problem Relation Age of Onset   Diabetes Mother    Hypertension Mother    Dementia Mother    Pancreatitis Mother    Asthma Mother    COPD Mother    Stroke Mother    Hyperlipidemia Mother    Heart disease Mother    Hypertension Father    Hypertension Sister    Diabetes Sister    Hyperlipidemia Sister    Hypertension Brother    Diabetes Brother    Drug abuse Brother    Cancer Brother        colon   Hyperlipidemia Brother    Colon cancer Brother 53   Hyperlipidemia Brother    Hypertension Brother    Hyperlipidemia Sister    Hypertension Sister    Diabetes Sister    Diabetes Maternal Grandmother    Colon cancer Maternal Grandmother    Breast cancer Neg Hx    Esophageal cancer Neg Hx    Stomach cancer Neg Hx    Social History   Socioeconomic History   Marital status: Married    Spouse name: Harvie Heck   Number of children: 0   Years of education: 15   Highest education level: Not on file  Occupational History   Occupation: Radio producer: [employer]     Comment: Education administrator  Tobacco Use   Smoking status: Every Day    Current packs/day: 0.25    Average packs/day: 0.3 packs/day for 28.0 years (7.0 ttl pk-yrs)    Types: Cigarettes   Smokeless tobacco: Never  Vaping Use   Vaping status: Never Used  Substance  and Sexual Activity   Alcohol use: No    Alcohol/week: 0.0 standard drinks of alcohol   Drug use: No   Sexual activity: Yes    Partners: Male    Birth control/protection: Surgical  Other Topics Concern   Not on file  Social History Narrative   Currently living alone, separated from husband.  One story home.  [name]'s children are adults-2 live in [place], 2 live in [place].     Works for a [employer].  Education: 2 years of college.   Social Determinants of Health   Financial Resource Strain: Patient Declined (07/28/2023)   Overall Financial Resource Strain (CARDIA)    Difficulty of Paying Living Expenses: Patient declined  Food Insecurity: No Food Insecurity (08/01/2023)   Hunger Vital Sign    Worried About Running Out of Food in the Last Year: Never true    Ran Out of Food in the Last Year: Never true  Transportation Needs: No Transportation Needs (07/28/2023)   PRAPARE - Administrator, Civil Service (Medical): No    Lack of Transportation (Non-Medical): No  Physical Activity: Insufficiently Active (07/28/2023)   Exercise Vital Sign    Days of Exercise per Week: 3 days    Minutes of Exercise per Session: 20 min  Stress: No Stress Concern Present (07/28/2023)   [employer] of Occupational Health - Occupational Stress Questionnaire    Feeling of Stress : Only a little  Social Connections: Unknown (07/28/2023)   Social Connection and Isolation Panel [NHANES]    Frequency of Communication with Friends and Family: More than three times a week    Frequency of Social Gatherings with Friends and Family: More than three times a week    Attends Religious Services: Not on Marketing executive or Organizations: Patient declined    Attends Banker Meetings: Patient declined    Marital Status: Patient declined    Tobacco Counseling Ready to quit: Yes Counseling given: Not Answered   Clinical Intake:  Pre-visit preparation completed:  Yes  Pain : No/denies pain     Nutritional Risks: None Diabetes: Yes CBG done?: No Did pt. bring in CBG monitor from home?: No  How often do you need to have someone help you when you read instructions, pamphlets, or other written materials from your doctor or pharmacy?: 1 - Never  Interpreter Needed?: No  Information entered by :: NAllen LPN   Activities of Daily Living    07/28/2023   12:51 PM  In your present state of health, do you have any difficulty performing the following activities:  Hearing? 0  Vision? 0  Difficulty concentrating or making decisions? 0  Walking or climbing stairs? 0  Dressing or bathing? 0  Doing errands, shopping? 0  Preparing Food and eating ? N  Using the Toilet? N  In the past six months, have you accidently leaked urine? N  Do you have problems with loss of bowel control? N  Managing your Medications? N  Managing your Finances? N  Housekeeping or managing your Housekeeping? N    Patient Care Team: Tysinger, Kermit Balo, PA-C as PCP - General (Family Medicine) Janet Berlin, MD as Consulting Physician (Ophthalmology) Rinaldo Cloud, MD as Consulting Physician (Cardiology)  Indicate any recent Medical Services you may have received from other than [facility] providers in the past year (date may be approximate).     Assessment:   This is a routine wellness examination for [place].  Hearing/Vision screen Hearing Screening - Comments:: Denies hearing issues Vision Screening - Comments:: Regular eye exams, Dr. Burgess Estelle  Dietary issues and exercise activities discussed:     Goals Addressed             This Visit's Progress    Patient Stated       08/01/2023, would like to quit smoking eventually       Depression Screen    08/01/2023    9:46 AM 02/28/2023   10:09 AM  11/08/2022    8:51 AM 11/08/2022    8:38 AM 02/08/2022    8:48 AM 04/12/2021   10:17 AM 03/02/2021    3:27 PM  PHQ 2/9 Scores  PHQ - 2 Score 0 0 0 0 0 0 0  PHQ- 9 Score 1   0 0       Fall Risk    07/28/2023   12:51 PM 02/28/2023   10:09 AM 11/08/2022    8:44 AM 07/05/2022   10:16 AM 02/08/2022    8:48 AM  Fall Risk   Falls in the past year? 0 0 0 0 0  Number falls in past yr: 0 0 0 0 0  Injury with Fall? 0 0 0 0 0  Risk for fall due to : Medication side effect No Fall Risks No Fall Risks No Fall Risks No Fall Risks  Follow up Falls prevention discussed;Falls evaluation completed Falls evaluation completed       MEDICARE RISK AT HOME:  Medicare Risk at Home - 08/01/23 0945     Any stairs in or around the home? Yes    If so, are there any without handrails? No    Home free of loose throw rugs in walkways, pet beds, electrical cords, etc? Yes    Adequate lighting in your home to reduce risk of falls? Yes    Life alert? No    Use of a cane, walker or w/c? No    Grab bars in the bathroom? Yes    Shower chair or bench in shower? No    Elevated toilet seat or a handicapped toilet? No             TIMED UP AND GO:  Was the test performed?  Yes  Length of time to ambulate 10 feet: 5 sec Gait steady and fast without use of assistive device    Cognitive Function:    07/05/2022   10:16 AM 03/02/2021    3:27 PM  MMSE - Mini Mental State Exam  Orientation to time 5 5  Orientation to Place 5 5  Registration 3 3  Attention/ Calculation 5 5  Recall 3 3  Language- name 2 objects 2 2  Language- repeat 1 1  Language- follow 3 step command 3 3  Language- read & follow direction 1 1  Write a sentence 1 1  Copy design 1 1  Total score 30 30        08/01/2023    9:47 AM 07/05/2022   10:20 AM  6CIT Screen  What Year? 0 points 0 points  What month? 0 points 0 points  What time? 0 points 0 points  Count back from 20 0 points 0 points  Months in reverse 0 points 0 points  Repeat phrase 0 points 0 points  Total Score 0 points 0 points    Immunizations Immunization History  Administered Date(s) Administered   Influenza, Seasonal, Injecte,  Preservative Fre 01/04/2013   Influenza,inj,Quad PF,6+ Mos 12/05/2016, 09/17/2019   Influenza-Unspecified 09/14/2017, 09/25/2020, 10/17/2021   PFIZER(Purple Top)SARS-COV-2 Vaccination 03/26/2020, 04/20/2020, 12/15/2020   PPD Test 04/10/2018   Pneumococcal Conjugate-13 12/13/2019   Pneumococcal Polysaccharide-23 12/05/2016, 02/08/2022   Tdap 02/19/2018   Zoster Recombinant(Shingrix) 07/09/2022    TDAP status: Up to date  Flu Vaccine status: Due, Education has been provided regarding the importance of this vaccine. Advised may receive this vaccine at local pharmacy or Health Dept. Aware to provide a copy of the vaccination record if obtained from  local pharmacy or Health Dept. Verbalized acceptance and understanding.  Pneumococcal vaccine status: Up to date  Covid-19 vaccine status: Information provided on how to obtain vaccines.   Qualifies for Shingles Vaccine? Yes   Zostavax completed No   Shingrix Completed?: Yes  Screening Tests Health Maintenance  Topic Date Due   COVID-19 Vaccine (4 - 2023-24 season) 08/26/2022   Zoster Vaccines- Shingrix (2 of 2) 09/03/2022   Diabetic kidney evaluation - Urine ACR  05/28/2023   Colonoscopy  06/23/2023   INFLUENZA VACCINE  07/27/2023   HEMOGLOBIN A1C  08/31/2023   Diabetic kidney evaluation - eGFR measurement  02/28/2024   FOOT EXAM  02/28/2024   OPHTHALMOLOGY EXAM  04/19/2024   Medicare Annual Wellness (AWV)  07/31/2024   MAMMOGRAM  11/02/2024   DTaP/Tdap/Td (2 - Td or Tdap) 02/20/2028   Pneumonia Vaccine 10+ Years old  Completed   DEXA SCAN  Completed   Hepatitis C Screening  Completed   HPV VACCINES  Aged Out    Health Maintenance  Health Maintenance Due  Topic Date Due   COVID-19 Vaccine (4 - 2023-24 season) 08/26/2022   Zoster Vaccines- Shingrix (2 of 2) 09/03/2022   Diabetic kidney evaluation - Urine ACR  05/28/2023   Colonoscopy  06/23/2023   INFLUENZA VACCINE  07/27/2023    Colorectal cancer screening: scheduled for  09/22/2023  Mammogram status: scheduled for 11/28/2023  Bone Density status: Completed 06/15/2022.   Lung Cancer Screening: (Low Dose CT Chest recommended if Age 60-80 years, 20 pack-year currently smoking OR have quit w/in 15years.) does not qualify.   Lung Cancer Screening Referral: no  Additional Screening:  Hepatitis C Screening: does qualify; Completed 07/05/2013  Vision Screening: Recommended annual ophthalmology exams for early detection of glaucoma and other disorders of the eye. Is the patient up to date with their annual eye exam?  Yes  Who is the provider or what is the name of the office in which the patient attends annual eye exams? Dr. Burgess Estelle If pt is not established with a provider, would they like to be referred to a provider to establish care? No .   Dental Screening: Recommended annual dental exams for proper oral hygiene  Diabetic Foot Exam: Diabetic Foot Exam: Completed 04/20/2023  Community Resource Referral / Chronic Care Management: CRR required this visit?  No   CCM required this visit?  No     Plan:     I have personally reviewed and noted the following in the patient's chart:   Medical and social history Use of alcohol, tobacco or illicit drugs  Current medications and supplements including opioid prescriptions. Patient is not currently taking opioid prescriptions. Functional ability and status Nutritional status Physical activity Advanced directives List of other physicians Hospitalizations, surgeries, and ER visits in previous 12 months Vitals Screenings to include cognitive, depression, and falls Referrals and appointments  In addition, I have reviewed and discussed with patient certain preventive protocols, quality metrics, and best practice recommendations. A written personalized care plan for preventive services as well as general preventive health recommendations were provided to patient.     Barb Merino, LPN   4/0/9811   After  Visit Summary: in person  Nurse Notes: none

## 2023-08-01 NOTE — Patient Instructions (Signed)
Bailey [name] , Thank you for taking time to come for your Medicare Wellness Visit. I appreciate your ongoing commitment to your health goals. Please review the following plan we discussed and let me know if I can assist you in the future.   Referrals/Orders/Follow-Ups/Clinician Recommendations: none  This is a list of the screening recommended for you and due dates:  Health Maintenance  Topic Date Due   COVID-19 Vaccine (4 - 2023-24 season) 08/26/2022   Yearly kidney health urinalysis for diabetes  05/28/2023   Colon Cancer Screening  06/23/2023   Flu Shot  07/27/2023   Zoster (Shingles) Vaccine (2 of 2) 11/01/2023*   Hemoglobin A1C  08/31/2023   Yearly kidney function blood test for diabetes  02/28/2024   Complete foot exam   02/28/2024   Eye exam for diabetics  04/19/2024   Medicare Annual Wellness Visit  07/31/2024   Mammogram  11/02/2024   DTaP/Tdap/Td vaccine (2 - Td or Tdap) 02/20/2028   Pneumonia Vaccine  Completed   DEXA scan (bone density measurement)  Completed   Hepatitis C Screening  Completed   HPV Vaccine  Aged Out  *Topic was postponed. The date shown is not the original due date.    Advanced directives: (Copy Requested) Please bring a copy of your health care power of attorney and living will to the office to be added to your chart at your convenience.  Next Medicare Annual Wellness Visit scheduled for next year: Yes   [facility]  [phone]    Preventive Care 65 Years and Older, Female Preventive care refers to lifestyle choices and visits with your health care provider that can promote health and wellness. What does preventive care include? A yearly physical exam. This is also called an annual well check. Dental exams once or twice a year. Routine eye exams. Ask your health care provider how often you should have your eyes checked. Personal lifestyle choices, including: Daily care of your teeth and gums. Regular physical activity. Eating a healthy  diet. Avoiding tobacco and drug use. Limiting alcohol use. Practicing safe sex. Taking low-dose aspirin every day. Taking vitamin and mineral supplements as recommended by your health care provider. What happens during an annual well check? The services and screenings done by your health care provider during your annual well check will depend on your age, overall health, lifestyle risk factors, and family history of disease. Counseling  Your health care provider may ask you questions about your: Alcohol use. Tobacco use. Drug use. Emotional well-being. Home and relationship well-being. Sexual activity. Eating habits. History of falls. Memory and ability to understand (cognition). Work and work Astronomer. Reproductive health. Screening  You may have the following tests or measurements: Height, weight, and BMI. Blood pressure. Lipid and cholesterol levels. These may be checked every 5 years, or more frequently if you are over 73 years old. Skin check. Lung cancer screening. You may have this screening every year starting at age 80 if you have a 30-pack-year history of smoking and currently smoke or have quit within the past 15 years. Fecal occult blood test (FOBT) of the stool. You may have this test every year starting at age 78. Flexible sigmoidoscopy or colonoscopy. You may have a sigmoidoscopy every 5 years or a colonoscopy every 10 years starting at age 73. Hepatitis C blood test. Hepatitis B blood test. Sexually transmitted disease (STD) testing. Diabetes screening. This is done by checking your blood sugar (glucose) after you have not eaten for a while (fasting).  You may have this done every 1-3 years. Bone density scan. This is done to screen for osteoporosis. You may have this done starting at age 57. Mammogram. This may be done every 1-2 years. Talk to your health care provider about how often you should have regular mammograms. Talk with your health care provider about  your test results, treatment options, and if necessary, the need for more tests. Vaccines  Your health care provider may recommend certain vaccines, such as: Influenza vaccine. This is recommended every year. Tetanus, diphtheria, and acellular pertussis (Tdap, Td) vaccine. You may need a Td booster every 10 years. Zoster vaccine. You may need this after age 68. Pneumococcal 13-valent conjugate (PCV13) vaccine. One dose is recommended after age 6. Pneumococcal polysaccharide (PPSV23) vaccine. One dose is recommended after age 81. Talk to your health care provider about which screenings and vaccines you need and how often you need them. This information is not intended to replace advice given to you by your health care provider. Make sure you discuss any questions you have with your health care provider. Document Released: 01/08/2016 Document Revised: 08/31/2016 Document Reviewed: 10/13/2015 Elsevier Interactive Patient Education  2017 [employer].  Fall Prevention in the Home Falls can cause injuries. They can happen to people of all ages. There are many things you can do to make your home safe and to help prevent falls. What can I do on the outside of my home? Regularly fix the edges of walkways and driveways and fix any cracks. Remove anything that might make you trip as you walk through a door, such as a raised step or threshold. Trim any bushes or trees on the path to your home. Use bright outdoor lighting. Clear any walking paths of anything that might make someone trip, such as rocks or tools. Regularly check to see if handrails are loose or broken. Make sure that both sides of any steps have handrails. Any raised decks and porches should have guardrails on the edges. Have any leaves, snow, or ice cleared regularly. Use sand or salt on walking paths during winter. Clean up any spills in your garage right away. This includes oil or grease spills. What can I do in the bathroom? Use  night lights. Install grab bars by the toilet and in the tub and shower. Do not use towel bars as grab bars. Use non-skid mats or decals in the tub or shower. If you need to sit down in the shower, use a plastic, non-slip stool. Keep the floor dry. Clean up any water that spills on the floor as soon as it happens. Remove soap buildup in the tub or shower regularly. Attach bath mats securely with double-sided non-slip rug tape. Do not have throw rugs and other things on the floor that can make you trip. What can I do in the bedroom? Use night lights. Make sure that you have a light by your bed that is easy to reach. Do not use any sheets or blankets that are too big for your bed. They should not hang down onto the floor. Have a firm chair that has side arms. You can use this for support while you get dressed. Do not have throw rugs and other things on the floor that can make you trip. What can I do in the kitchen? Clean up any spills right away. Avoid walking on wet floors. Keep items that you use a lot in easy-to-reach places. If you need to reach something above you, use a  strong step stool that has a grab bar. Keep electrical cords out of the way. Do not use floor polish or wax that makes floors slippery. If you must use wax, use non-skid floor wax. Do not have throw rugs and other things on the floor that can make you trip. What can I do with my stairs? Do not leave any items on the stairs. Make sure that there are handrails on both sides of the stairs and use them. Fix handrails that are broken or loose. Make sure that handrails are as long as the stairways. Check any carpeting to make sure that it is firmly attached to the stairs. Fix any carpet that is loose or worn. Avoid having throw rugs at the top or bottom of the stairs. If you do have throw rugs, attach them to the floor with carpet tape. Make sure that you have a light switch at the top of the stairs and the bottom of the  stairs. If you do not have them, ask someone to add them for you. What else can I do to help prevent falls? Wear shoes that: Do not have high heels. Have rubber bottoms. Are comfortable and fit you well. Are closed at the toe. Do not wear sandals. If you use a stepladder: Make sure that it is fully opened. Do not climb a closed stepladder. Make sure that both sides of the stepladder are locked into place. Ask someone to hold it for you, if possible. Clearly mark and make sure that you can see: Any grab bars or handrails. First and last steps. Where the edge of each step is. Use tools that help you move around (mobility aids) if they are needed. These include: Canes. Walkers. Scooters. Crutches. Turn on the lights when you go into a dark area. Replace any light bulbs as soon as they burn out. Set up your furniture so you have a clear path. Avoid moving your furniture around. If any of your floors are uneven, fix them. If there are any pets around you, be aware of where they are. Review your medicines with your doctor. Some medicines can make you feel dizzy. This can increase your chance of falling. Ask your doctor what other things that you can do to help prevent falls. This information is not intended to replace advice given to you by your health care provider. Make sure you discuss any questions you have with your health care provider. Document Released: 10/08/2009 Document Revised: 05/19/2016 Document Reviewed: 01/16/2015 Elsevier Interactive Patient Education  2017 [employer].

## 2023-08-07 ENCOUNTER — Other Ambulatory Visit: Payer: Self-pay | Admitting: Medical

## 2023-08-13 ENCOUNTER — Other Ambulatory Visit: Payer: Self-pay | Admitting: Medical

## 2023-09-01 ENCOUNTER — Ambulatory Visit (AMBULATORY_SURGERY_CENTER): Payer: Medicare Other

## 2023-09-01 VITALS — Ht 61.5 in | Wt 137.0 lb

## 2023-09-01 DIAGNOSIS — Z86010 Personal history of colonic polyps: Secondary | ICD-10-CM

## 2023-09-01 DIAGNOSIS — Z800 Family history of malignant neoplasm of digestive organs: Secondary | ICD-10-CM

## 2023-09-01 MED ORDER — NA SULFATE-K SULFATE-MG SULF 17.5-3.13-1.6 GM/177ML PO SOLN: 1.0000 | Freq: Once | ORAL | 0 refills | Status: AC

## 2023-09-01 NOTE — Progress Notes (Signed)
Pre visit completed via phone call; Patient verified name, DOB, and address;  No egg or soy allergy known to patient;  No issues known to pt with past sedation with any surgeries or procedures; Patient denies ever being told they had issues or difficulty with intubation;  No FH of Malignant Hyperthermia; Pt is not on diet pills; Pt is not on home 02;  Pt is not on blood thinners;  Pt reports issues with constipation- at times depending on diet intake; No A fib or A flutter;  Have any cardiac testing pending--NO Insurance verified during PV appt--- [facility] Medicare  Pt can ambulate without assistance;  Pt denies use of chewing tobacco; Discussed diabetic/weight loss medication holds; Discussed NSAID holds; Checked BMI to be less than 50; Pt instructed to use Singlecare.com or GoodRx for a price reduction on prep; Patient's chart reviewed by Cathlyn Parsons CNRA prior to previsit and patient appropriate for the [facility]; Pre visit completed and red dot placed by patient's name on their procedure day (on provider's schedule).    Instructions printed and placed on the 2nd floor with receptionist for patient to pick up at her leisure;

## 2023-09-11 ENCOUNTER — Telehealth: Payer: Self-pay | Admitting: Medical

## 2023-09-11 ENCOUNTER — Ambulatory Visit (INDEPENDENT_AMBULATORY_CARE_PROVIDER_SITE_OTHER): Payer: Medicare Other | Admitting: Medical

## 2023-09-11 VITALS — BP 120/80 | HR 69 | Wt 136.2 lb

## 2023-09-11 DIAGNOSIS — Z0000 Encounter for general adult medical examination without abnormal findings: Secondary | ICD-10-CM | POA: Diagnosis not present

## 2023-09-11 DIAGNOSIS — E1165 Type 2 diabetes mellitus with hyperglycemia: Secondary | ICD-10-CM | POA: Diagnosis not present

## 2023-09-11 DIAGNOSIS — R0989 Other specified symptoms and signs involving the circulatory and respiratory systems: Secondary | ICD-10-CM | POA: Diagnosis not present

## 2023-09-11 DIAGNOSIS — M8580 Other specified disorders of bone density and structure, unspecified site: Secondary | ICD-10-CM

## 2023-09-11 DIAGNOSIS — E782 Mixed hyperlipidemia: Secondary | ICD-10-CM

## 2023-09-11 DIAGNOSIS — F17200 Nicotine dependence, unspecified, uncomplicated: Secondary | ICD-10-CM

## 2023-09-11 DIAGNOSIS — E1169 Type 2 diabetes mellitus with other specified complication: Secondary | ICD-10-CM | POA: Diagnosis not present

## 2023-09-11 DIAGNOSIS — Z136 Encounter for screening for cardiovascular disorders: Secondary | ICD-10-CM | POA: Diagnosis not present

## 2023-09-11 DIAGNOSIS — R29898 Other symptoms and signs involving the musculoskeletal system: Secondary | ICD-10-CM

## 2023-09-11 DIAGNOSIS — M48062 Spinal stenosis, lumbar region with neurogenic claudication: Secondary | ICD-10-CM

## 2023-09-11 DIAGNOSIS — M1990 Unspecified osteoarthritis, unspecified site: Secondary | ICD-10-CM | POA: Diagnosis not present

## 2023-09-11 DIAGNOSIS — Z90710 Acquired absence of both cervix and uterus: Secondary | ICD-10-CM

## 2023-09-11 LAB — POCT URINALYSIS DIP (PROADVANTAGE DEVICE)
Bilirubin, UA: NEGATIVE
Blood, UA: NEGATIVE
Glucose, UA: >=1000 mg/dL — AB
Ketones, POC UA: NEGATIVE mg/dL
Leukocytes, UA: NEGATIVE
Nitrite, UA: NEGATIVE
Protein Ur, POC: NEGATIVE mg/dL
Specific Gravity, Urine: 1.02
Urobilinogen, Ur: NEGATIVE
pH, UA: 6

## 2023-09-11 NOTE — Progress Notes (Signed)
Subjective:   HPI  Bailey [name] is a 67 y.o. female who presents for Chief Complaint  Patient presents with   fasting cpe    Fasting cpe,  right side weakness x 1 month, colonoscopy 9/27    Patient Care Team: Arlo Buffone, Kermit Balo, PA-C as PCP - General (Family Medicine) Janet Berlin, MD as Consulting Physician (Ophthalmology) Rinaldo Cloud, MD as Consulting Physician (Cardiology) Dr. Francena Hanly, orthopedics Dr. Bradly Bienenstock, orthopedics Dr. Ammie Ferrier, audiology Dr. Marsa Aris, GI   Concerns: Feels some type of irritation in throat this last few days.  No other URI symptoms.   Had flu shot this weekend .   Diabetes Compliant with Jardiance 10 mg daily, glimepiride 4 mg daily, Lantus 33 units daily, Ozempic 0.5 mg weekly  Hyperlipidemia-compliant with Lipitor 40 mg daily  Hx/o spinal stenosis, arthritis - Uses gabapentin 300 mg daily at bedtime   Gynecological history: History of hysterectomy including ovaries years ago.   Reviewed their medical, surgical, family, social, medication, and allergy history and updated chart as appropriate.  Allergies  Allergen Reactions   Metformin Diarrhea   Metformin And Related Diarrhea   Shellfish Allergy Anaphylaxis, Hives and Swelling    Lobster, crab (shrimp's ok)   Ivp Dye [Iodinated Contrast Media] Hives   Adhesive [Tape] Other (See Comments)    blisters   Chantix [Varenicline] Other (See Comments)    depression   Codeine Other (See Comments)    Unknown childhood allergy   Diphenhydramine Other (See Comments)    Causes "drunk feeling" "I can't function for days"/sleepiness   Hydrocodone-Acetaminophen    Hydrocodone-Acetaminophen Nausea And Vomiting and Diarrhea   Penicillin G    Varenicline Tartrate Other (See Comments)   Latex Other (See Comments) and Itching    blisters   Penicillins Other (See Comments)    Unknown childhood reaction    Past Medical History:  Diagnosis Date   Arthritis     generalized   Cataract    LEFT eye   Cervical radiculopathy at C5    Dupuytren's contracture of both hands    L > R   Dyslipidemia    History of adenomatous polyp of colon    1997 and 2003     History of chronic pelvic inflammatory disease    History of gastric ulcer    2008   Hypertriglyceridemia    on meds   Osteoporosis    Right carpal tunnel syndrome    Seasonal allergies    Type 2 diabetes mellitus, uncontrolled    last A1c 8.9 on 07-07-2016   Vitamin D deficiency      Current Outpatient Medications:    atorvastatin (LIPITOR) 40 MG tablet, TAKE 1 TABLET BY MOUTH EVERY DAY, Disp: 90 tablet, Rfl: 0   CALCIUM PO, Take 600 mg by mouth daily., Disp: , Rfl:    ciclopirox (PENLAC) 8 % solution, Apply topically at bedtime. Apply over nail and surrounding skin. Apply daily over previous coat. After seven (7) days, may remove with alcohol and continue cycle., Disp: 6.6 mL, Rfl: 5   Continuous Blood Gluc Receiver (DEXCOM G7 RECEIVER) DEVI, UAD, Disp: 1 each, Rfl: 0   Continuous Glucose Sensor (DEXCOM G7 SENSOR) MISC, USE TO CHECK BLOOD SUGAR THREE TIMES DAILY. CHANGE SENSORS ONCE EVERY 10 DAYS., Disp: 3 each, Rfl: 3   empagliflozin (JARDIANCE) 10 MG TABS tablet, Take 10 mg by mouth daily., Disp: , Rfl:    gabapentin (NEURONTIN) 300 MG capsule, TAKE 1 CAPSULE(300 MG)  BY MOUTH AT BEDTIME (Patient taking differently: Take 300 mg by mouth at bedtime as needed. TAKE 1 CAPSULE(300 MG) BY MOUTH AT BEDTIME), Disp: 90 capsule, Rfl: 1   glimepiride (AMARYL) 4 MG tablet, Take 1 tablet by mouth daily with breakfast., Disp: , Rfl:    Glucagon, rDNA, (GLUCAGON EMERGENCY) 1 MG KIT, Inj 1 mg IM as needed for significant low blood sugar episode, Disp: 1 kit, Rfl: 0   glucose blood test strip, 2 (two) times daily., Disp: , Rfl:    LANTUS SOLOSTAR 100 UNIT/ML Solostar Pen, Inject 33 Units into the skin at bedtime., Disp: 15 mL, Rfl: 1   lidocaine (LIDODERM) 5 %, Place 1 patch onto the skin daily.  Remove & Discard patch within 12 hours or as directed by MD, Disp: 30 patch, Rfl: 0   meloxicam (MOBIC) 15 MG tablet, TAKE 1 TABLET (15 MG TOTAL) BY MOUTH DAILY., Disp: 30 tablet, Rfl: 0   Multiple Vitamins-Minerals (MULTIVITAMIN WITH MINERALS) tablet, Take 1 tablet by mouth every other day. , Disp: , Rfl:    Semaglutide,0.25 or 0.5MG /DOS, (OZEMPIC, 0.25 OR 0.5 MG/DOSE,) 2 MG/3ML SOPN, INJECT 0.25MG  INTO THE SKIN ONE TIME PER WEEK, Disp: 3 mL, Rfl: 1   Insulin Pen Needle (BD PEN NEEDLE NANO U/F) 32G X 4 MM MISC, Use once a day with insulin pen, Disp: 100 each, Rfl: 6  Family History  Problem Relation Age of Onset   Diabetes Mother    Hypertension Mother    Dementia Mother    Pancreatitis Mother    Asthma Mother    COPD Mother    Stroke Mother    Hyperlipidemia Mother    Heart disease Mother    Hypertension Father    Colon polyps Sister 3   Hypertension Sister    Diabetes Sister    Hyperlipidemia Sister    Hyperlipidemia Sister    Hypertension Sister    Diabetes Sister    Colon polyps Brother 19   Hypertension Brother    Diabetes Brother    Drug abuse Brother    Hyperlipidemia Brother    Colon cancer Brother 46   Hyperlipidemia Brother    Hypertension Brother    Colon polyps Maternal Grandmother    Diabetes Maternal Grandmother    Colon cancer Maternal Grandmother    Breast cancer Neg Hx    Esophageal cancer Neg Hx    Rectal cancer Neg Hx    Stomach cancer Neg Hx     Past Surgical History:  Procedure Laterality Date   CATARACT EXTRACTION Left 05/04/2023   COLONOSCOPY  2019   KN-MAC-2 day Plenvu(exc)-tics/TA-5 yr recall   DUPUYTREN / PALMAR FASCIOTOMY Bilateral    2023   ESOPHAGOGASTRODUODENOSCOPY  last one 2008   FASCIECTOMY Left 08/24/2016   Procedure: LEFT HAND PALMER AND SMALL FINGER DIGiTAL FASCIECTOMY AND RELEASE;  Surgeon: Bradly Bienenstock, MD;  Location: [facility];  Service: Orthopedics;  Laterality: Left;   LAPAROSCOPIC CHOLECYSTECTOMY   07/17/2000   ROTATOR CUFF REPAIR Right 2000   TOTAL ABDOMINAL HYSTERECTOMY W/ BILATERAL SALPINGOOPHORECTOMY  1998    Review of Systems  Constitutional:  Negative for chills, fever, malaise/fatigue and weight loss.  HENT:  Negative for congestion, ear pain, hearing loss, sore throat and tinnitus.   Eyes:  Negative for blurred vision, pain and redness.  Respiratory:  Negative for cough, hemoptysis and shortness of breath.   Cardiovascular:  Negative for chest pain, palpitations, orthopnea, claudication and leg swelling.  Gastrointestinal:  Negative for  abdominal pain, blood in stool, constipation, diarrhea, nausea and vomiting.  Genitourinary:  Negative for dysuria, flank pain, frequency, hematuria and urgency.  Musculoskeletal:  Positive for back pain, joint pain and myalgias. Negative for falls.  Skin:  Negative for itching and rash.  Neurological:  Negative for dizziness, tingling, speech change, weakness and headaches.  Endo/Heme/Allergies:  Negative for polydipsia. Does not bruise/bleed easily.  Psychiatric/Behavioral:  Negative for depression and memory loss. The patient is not nervous/anxious and does not have insomnia.        Objective:  BP 120/80   Pulse 69   Wt 136 lb 3.2 oz (61.8 kg)   BMI 25.32 kg/m   General appearance: alert, no distress, WD/WN, African American female Skin: Unremarkable HEENT: normocephalic, conjunctiva/corneas normal, sclerae anicteric, PERRLA, EOMi, nares patent, no discharge or erythema, pharynx normal Oral cavity: Somewhat dry MM, tongue normal Neck: supple, no lymphadenopathy, no thyromegaly, no masses, normal ROM, no bruits Chest: non tender, normal shape and expansion Heart: RRR, normal S1, S2, no murmurs Lungs: CTA bilaterally, no wheezes, rhonchi, or rales Abdomen: +bs, soft, non tender, non distended, no masses, no hepatomegaly, no splenomegaly, no bruits Back: non tender, normal ROM, no scoliosis Musculoskeletal: upper extremities non  tender, no obvious deformity, normal ROM throughout, lower extremities non tender, no obvious deformity, normal ROM throughout Extremities: no edema, no cyanosis, no clubbing Pulses: 2+ symmetric, upper and 1+ lower extremities, normal cap refill Neurological: alert, oriented x 3, CN2-12 intact, strength normal upper extremities and lower extremities, sensation normal throughout, DTRs 2+ throughout, no cerebellar signs, gait normal Psychiatric: normal affect, behavior normal, pleasant  Breast/gyn/rectal - deferred to gynecology   Diabetic Foot Exam - Simple   Simple Foot Form Diabetic Foot exam was performed with the following findings: Yes 09/11/2023  9:24 AM  Visual Inspection See comments: Yes Sensation Testing Intact to touch and monofilament testing bilaterally: Yes Pulse Check See comments: Yes Comments 1+ pedal pulses, somewhat thickened toenails on several toenails,        Assessment and Plan :   Encounter Diagnoses  Name Primary?   Encounter for health maintenance examination in adult Yes   Arthritis    Dyslipidemia with low high density lipoprotein (HDL) cholesterol with hypertriglyceridemia due to type 2 diabetes mellitus (HCC)    Spinal stenosis of lumbar region with neurogenic claudication    Tobacco dependence    Uncontrolled type 2 diabetes mellitus with hyperglycemia (HCC)    Osteopenia, unspecified location    S/P hysterectomy with oophorectomy    Screening for heart disease    Decreased pedal pulses    Right leg weakness      This visit was a preventative care visit, also known as wellness visit or routine physical.   Topics typically include healthy lifestyle, diet, exercise, preventative care, vaccinations, sick and well care, proper use of emergency dept and after hours care, as well as other concerns.    Separate significant issues discussed: Diabetes-continue current medication, updated labs today  Leg weakness, history of spinal stenosis and facet  stenosis lumbar spine.  Try to get exercise very such as water aerobics, walking, elliptical or other less impact type aerobic exercise.  You also need to beginning weightbearing exercise.  If you are not familiar how to do some weightbearing exercise ask for a consult with a trainer to help you do some exercises at the [facility].  Hyperlipidemia-continue cholesterol medication.    General Recommendations: Continue to return yearly for your annual wellness and preventative  care visits.  This gives [place] a chance to discuss healthy lifestyle, exercise, vaccinations, review your chart record, and perform screenings where appropriate.  I recommend you see your eye doctor yearly for routine vision care.  I recommend you see your dentist yearly for routine dental care including hygiene visits twice yearly.   Vaccination recommendations were reviewed Immunization History  Administered Date(s) Administered   Influenza, High Dose Seasonal PF 09/09/2023   Influenza, Seasonal, Injecte, Preservative Fre 01/04/2013   Influenza,inj,Quad PF,6+ Mos 12/05/2016, 09/17/2019   Influenza,inj,quad, With Preservative 09/25/2020   Influenza-Unspecified 09/14/2017, 09/25/2020, 10/17/2021   PFIZER(Purple Top)SARS-COV-2 Vaccination 03/26/2020, 04/20/2020, 12/15/2020   PPD Test 04/10/2018   Pneumococcal Conjugate-13 12/13/2019   Pneumococcal Polysaccharide-23 12/05/2016, 02/08/2022   Tdap 02/19/2018   Zoster Recombinant(Shingrix) 07/09/2022    Vaccine recommendations: Get your last Shingrix dose    Screening for cancer: Colon cancer screening: Prior or last colon cancer screen: 2019 and has upcoming repeat colonoscopy before end of 2024.   Breast cancer screening: You should perform a self breast exam monthly.   We reviewed recommendations for regular mammograms and breast cancer screening. Last mammogram: 2024 with plant to repeat 11/2023 due to finding in left breast.    Cervical cancer screening: We  reviewed recommendations for pap smear screening. Last pap - s/p hysterectomy   Skin cancer screening: Check your skin regularly for new changes, growing lesions, or other lesions of concern Come in for evaluation if you have skin lesions of concern.  Lung cancer screening: If you have a greater than 20 pack year history of tobacco use, then you may qualify for lung cancer screening with a chest CT scan.   Please call your insurance company to inquire about coverage for this test.  Pancreatic cancer: no current screening test is available routinely recommended.  (Risk factors: Smoking, overweight or obese, diabetes, chronic pancreatitis, work Nurse, mental health, Solicitor, 36 year old or greater, female greater than female, African-American, family history of pancreatic cancer, hereditary breast, ovarian, melanoma, Lynch, Peutz-jeghers).  We currently don't have screenings for other cancers besides breast, cervical, colon, and lung cancers.  If you have a strong family history of cancer or have other cancer screening concerns, please let me know.    Bone health: Get at least 150 minutes of aerobic exercise weekly Get weight bearing exercise at least once weekly Bone density test:  A bone density test is an imaging test that uses a type of X-ray to measure the amount of calcium and other minerals in your bones. The test may be used to diagnose or screen you for a condition that causes weak or thin bones (osteoporosis), predict your risk for a broken bone (fracture), or determine how well your osteoporosis treatment is working. The bone density test is recommended for females 65 and older, or females or males <65 if certain risk factors such as thyroid disease, long term use of steroids such as for asthma or rheumatological issues, vitamin D deficiency, estrogen deficiency, family history of osteoporosis, self or family history of fragility fracture in first degree relative.  Bone density  05/2022 shows osteopenia.   Heart health: Get at least 150 minutes of aerobic exercise weekly Limit alcohol It is important to maintain a healthy blood pressure and healthy cholesterol numbers  Heart disease screening: Screening for heart disease includes screening for blood pressure, fasting lipids, glucose/diabetes screening, BMI height to weight ratio, reviewed of smoking status, physical activity, and diet.    Goals include blood pressure 120/80 or less, maintaining  a healthy lipid/cholesterol profile, preventing diabetes or keeping diabetes numbers under good control, not smoking or using tobacco products, exercising most days per week or at least 150 minutes per week of exercise, and eating healthy variety of fruits and vegetables, healthy oils, and avoiding unhealthy food choices like fried food, fast food, high sugar and high cholesterol foods.    Other tests may possibly include EKG test, CT coronary calcium score, echocardiogram, exercise treadmill stress test.   I placed an order for CT coronary heart screening.  This is a CT scan of the heart.  It is $95 out-of-pocket cash.  Expect a phone call about this   Vascular disease screening: For high risk individuals including smokers, diabetes, patients with known heart disease or high blood pressure, kidney disease, and others, screening for vascular disease or atherosclerosis of the arteries is available.  Examples may include carotid ultrasound, abdominal aortic ultrasound, ABI blood flow screening in the legs, thoracic aorta screening.  I placed an order for ABI blood flow screening in legs.  Expect a phone call about this vascular screening in the legs which is recommended for diabetics   Medical care options: I recommend you continue to seek care here first for routine care.  We try really hard to have available appointments Monday through Friday daytime hours for sick visits, acute visits, and physicals.  Urgent care should be  used for after hours and weekends for significant issues that cannot wait till the next day.  The emergency department should be used for significant potentially life-threatening emergencies.  The emergency department is expensive, can often have long wait times for less significant concerns, so try to utilize primary care, urgent care, or telemedicine when possible to avoid unnecessary trips to the emergency department.  Virtual visits and telemedicine have been introduced since the pandemic started in 2020, and can be convenient ways to receive medical care.  We offer virtual appointments as well to assist you in a variety of options to seek medical care.   Legal Take the time to do a Last Will and Testament, advanced directives including Healthcare Power of Attorney and Living Will documents.  Do not leave your family with burdens that can be handled ahead of time.   Advanced Directives: I recommend you consider completing a Health Care Power of Attorney and Living Will.   These documents respect your wishes and help alleviate burdens on your loved ones if you were to become terminally ill or be in a position to need those documents enforced.    You can complete Advanced Directives yourself, have them notarized, then have copies made for our office, for you and for anybody you feel should have them in safe keeping.  Or, you can have an attorney prepare these documents.   If you haven't updated your Last Will and Testament in a while, it may be worthwhile having an attorney prepare these documents together and save on some costs.       Spiritual and Emotional Health Keeping a healthy spiritual life can help you better manage your physical health. Your spiritual life can help you to cope with any issues that may arise with your physical health.  Balance can keep [place] healthy and help [place] to recover.  If you are struggling with your spiritual health there are questions that you may want to ask  yourself:  What makes me feel most complete? When do I feel most connected to the rest of the world? Where do I  find the most inner strength? What am I doing when I feel whole?  Helpful tips: Being in nature. Some people feel very connected and at peace when they are walking outdoors or are outside. Helping others. Some feel the largest sense of wellbeing when they are of service to others. Being of service can take on many forms. It can be doing volunteer work, being kind to strangers, or offering a hand to a friend in need. Gratitude. Some people find they feel the most connected when they remain grateful. They may make lists of all the things they are grateful for or say a thank you out loud for all they have.    Emotional Health Are you in tune with your emotional health?  Check out this link: http://www.marquez-love.com/   Financial Health Make sure you use a budget for your personal finances Make sure you are insured against risks (health insurance, life insurance, auto insurance, etc) Save more, spend less Set financial goals If you need help in this area, good resources include counseling through [employer] or other community resources, have a meeting with a Social research officer, government, and a good resource is Salley Slaughter podcast    Odalys was seen today for fasting cpe.  Diagnoses and all orders for this visit:  Encounter for health maintenance examination in adult -     Microalbumin/Creatinine Ratio, Urine -     Hemoglobin A1c -     POCT Urinalysis DIP (Proadvantage Device) -     CBC -     VITAMIN D 25 Hydroxy (Vit-D Deficiency, Fractures) -     Basic metabolic panel -     CT CARDIAC SCORING (SELF PAY ONLY); Future -     VAS [place] ABI WITH/WO TBI; Future  Arthritis  Dyslipidemia with low high density lipoprotein (HDL) cholesterol with hypertriglyceridemia due to type 2 diabetes mellitus (HCC)  Spinal stenosis of lumbar region with neurogenic  claudication  Tobacco dependence  Uncontrolled type 2 diabetes mellitus with hyperglycemia (HCC) -     Microalbumin/Creatinine Ratio, Urine -     Hemoglobin A1c  Osteopenia, unspecified location -     VITAMIN D 25 Hydroxy (Vit-D Deficiency, Fractures)  S/P hysterectomy with oophorectomy  Screening for heart disease -     CT CARDIAC SCORING (SELF PAY ONLY); Future -     VAS [place] ABI WITH/WO TBI; Future  Decreased pedal pulses -     VAS [place] ABI WITH/WO TBI; Future  Right leg weakness     Follow-up pending labs, yearly for physical

## 2023-09-11 NOTE — Telephone Encounter (Signed)
This patient no showed for their appointment today.Which of the following is necessary for this patient.   A) No follow-up necessary   B) Follow-up urgent. Locate Patient Immediately.   C) Follow-up necessary. Contact patient and Schedule visit in ____ Days.   D) Follow-up Advised. Contact patient and Schedule visit in ____ Days.   E) Please Send no show letter to patient. Charge no show fee if no show was a CPE.

## 2023-09-11 NOTE — Patient Instructions (Signed)
This visit was a preventative care visit, also known as wellness visit or routine physical.   Topics typically include healthy lifestyle, diet, exercise, preventative care, vaccinations, sick and well care, proper use of emergency dept and after hours care, as well as other concerns.    Separate significant issues discussed: Diabetes-continue current medication, updated labs today  Leg weakness, history of spinal stenosis and facet stenosis lumbar spine.  Try to get exercise very such as water aerobics, walking, elliptical or other less impact type aerobic exercise.  You also need to beginning weightbearing exercise.  If you are not familiar how to do some weightbearing exercise ask for a consult with a trainer to help you do some exercises at the [facility].  Hyperlipidemia-continue cholesterol medication.    General Recommendations: Continue to return yearly for your annual wellness and preventative care visits.  This gives [place] a chance to discuss healthy lifestyle, exercise, vaccinations, review your chart record, and perform screenings where appropriate.  I recommend you see your eye doctor yearly for routine vision care.  I recommend you see your dentist yearly for routine dental care including hygiene visits twice yearly.   Vaccination recommendations were reviewed Immunization History  Administered Date(s) Administered   Influenza, High Dose Seasonal PF 09/09/2023   Influenza, Seasonal, Injecte, Preservative Fre 01/04/2013   Influenza,inj,Quad PF,6+ Mos 12/05/2016, 09/17/2019   Influenza,inj,quad, With Preservative 09/25/2020   Influenza-Unspecified 09/14/2017, 09/25/2020, 10/17/2021   PFIZER(Purple Top)SARS-COV-2 Vaccination 03/26/2020, 04/20/2020, 12/15/2020   PPD Test 04/10/2018   Pneumococcal Conjugate-13 12/13/2019   Pneumococcal Polysaccharide-23 12/05/2016, 02/08/2022   Tdap 02/19/2018   Zoster Recombinant(Shingrix) 07/09/2022    Vaccine recommendations: Get your last  Shingrix dose    Screening for cancer: Colon cancer screening: Prior or last colon cancer screen: 2019 and has upcoming repeat colonoscopy before end of 2024.   Breast cancer screening: You should perform a self breast exam monthly.   We reviewed recommendations for regular mammograms and breast cancer screening. Last mammogram: 2024 with plant to repeat 11/2023 due to finding in left breast.    Cervical cancer screening: We reviewed recommendations for pap smear screening. Last pap - s/p hysterectomy   Skin cancer screening: Check your skin regularly for new changes, growing lesions, or other lesions of concern Come in for evaluation if you have skin lesions of concern.  Lung cancer screening: If you have a greater than 20 pack year history of tobacco use, then you may qualify for lung cancer screening with a chest CT scan.   Please call your insurance company to inquire about coverage for this test.  Pancreatic cancer: no current screening test is available routinely recommended.  (Risk factors: Smoking, overweight or obese, diabetes, chronic pancreatitis, work Nurse, mental health, Solicitor, 63 year old or greater, female greater than female, African-American, family history of pancreatic cancer, hereditary breast, ovarian, melanoma, Lynch, Peutz-jeghers).  We currently don't have screenings for other cancers besides breast, cervical, colon, and lung cancers.  If you have a strong family history of cancer or have other cancer screening concerns, please let me know.    Bone health: Get at least 150 minutes of aerobic exercise weekly Get weight bearing exercise at least once weekly Bone density test:  A bone density test is an imaging test that uses a type of X-ray to measure the amount of calcium and other minerals in your bones. The test may be used to diagnose or screen you for a condition that causes weak or thin bones (osteoporosis), predict your risk for a  broken bone  (fracture), or determine how well your osteoporosis treatment is working. The bone density test is recommended for females 65 and older, or females or males <65 if certain risk factors such as thyroid disease, long term use of steroids such as for asthma or rheumatological issues, vitamin D deficiency, estrogen deficiency, family history of osteoporosis, self or family history of fragility fracture in first degree relative.  Bone density 05/2022 shows osteopenia.   Heart health: Get at least 150 minutes of aerobic exercise weekly Limit alcohol It is important to maintain a healthy blood pressure and healthy cholesterol numbers  Heart disease screening: Screening for heart disease includes screening for blood pressure, fasting lipids, glucose/diabetes screening, BMI height to weight ratio, reviewed of smoking status, physical activity, and diet.    Goals include blood pressure 120/80 or less, maintaining a healthy lipid/cholesterol profile, preventing diabetes or keeping diabetes numbers under good control, not smoking or using tobacco products, exercising most days per week or at least 150 minutes per week of exercise, and eating healthy variety of fruits and vegetables, healthy oils, and avoiding unhealthy food choices like fried food, fast food, high sugar and high cholesterol foods.    Other tests may possibly include EKG test, CT coronary calcium score, echocardiogram, exercise treadmill stress test.   I placed an order for CT coronary heart screening.  This is a CT scan of the heart.  It is $95 out-of-pocket cash.  Expect a phone call about this   Vascular disease screening: For high risk individuals including smokers, diabetes, patients with known heart disease or high blood pressure, kidney disease, and others, screening for vascular disease or atherosclerosis of the arteries is available.  Examples may include carotid ultrasound, abdominal aortic ultrasound, ABI blood flow screening in  the legs, thoracic aorta screening.  I placed an order for ABI blood flow screening in legs.  Expect a phone call about this vascular screening in the legs which is recommended for diabetics   Medical care options: I recommend you continue to seek care here first for routine care.  We try really hard to have available appointments Monday through Friday daytime hours for sick visits, acute visits, and physicals.  Urgent care should be used for after hours and weekends for significant issues that cannot wait till the next day.  The emergency department should be used for significant potentially life-threatening emergencies.  The emergency department is expensive, can often have long wait times for less significant concerns, so try to utilize primary care, urgent care, or telemedicine when possible to avoid unnecessary trips to the emergency department.  Virtual visits and telemedicine have been introduced since the pandemic started in 2020, and can be convenient ways to receive medical care.  We offer virtual appointments as well to assist you in a variety of options to seek medical care.   Legal Take the time to do a Last Will and Testament, advanced directives including Healthcare Power of Attorney and Living Will documents.  Do not leave your family with burdens that can be handled ahead of time.   Advanced Directives: I recommend you consider completing a Health Care Power of Attorney and Living Will.   These documents respect your wishes and help alleviate burdens on your loved ones if you were to become terminally ill or be in a position to need those documents enforced.    You can complete Advanced Directives yourself, have them notarized, then have copies made for our office, for you and  for anybody you feel should have them in safe keeping.  Or, you can have an attorney prepare these documents.   If you haven't updated your Last Will and Testament in a while, it may be worthwhile having an  attorney prepare these documents together and save on some costs.       Spiritual and Emotional Health Keeping a healthy spiritual life can help you better manage your physical health. Your spiritual life can help you to cope with any issues that may arise with your physical health.  Balance can keep [place] healthy and help [place] to recover.  If you are struggling with your spiritual health there are questions that you may want to ask yourself:  What makes me feel most complete? When do I feel most connected to the rest of the world? Where do I find the most inner strength? What am I doing when I feel whole?  Helpful tips: Being in nature. Some people feel very connected and at peace when they are walking outdoors or are outside. Helping others. Some feel the largest sense of wellbeing when they are of service to others. Being of service can take on many forms. It can be doing volunteer work, being kind to strangers, or offering a hand to a friend in need. Gratitude. Some people find they feel the most connected when they remain grateful. They may make lists of all the things they are grateful for or say a thank you out loud for all they have.    Emotional Health Are you in tune with your emotional health?  Check out this link: http://www.marquez-love.com/   Financial Health Make sure you use a budget for your personal finances Make sure you are insured against risks (health insurance, life insurance, auto insurance, etc) Save more, spend less Set financial goals If you need help in this area, good resources include counseling through [employer] or other community resources, have a meeting with a Social research officer, government, and a good resource is [employer]

## 2023-09-12 ENCOUNTER — Other Ambulatory Visit: Payer: Self-pay | Admitting: Medical

## 2023-09-12 DIAGNOSIS — E1136 Type 2 diabetes mellitus with diabetic cataract: Secondary | ICD-10-CM

## 2023-09-12 DIAGNOSIS — E1169 Type 2 diabetes mellitus with other specified complication: Secondary | ICD-10-CM

## 2023-09-12 LAB — BASIC METABOLIC PANEL
BUN/Creatinine Ratio: 16 (ref 12–28)
BUN: 13 mg/dL (ref 8–27)
CO2: 24 mmol/L (ref 20–29)
Calcium: 9.4 mg/dL (ref 8.7–10.3)
Chloride: 108 mmol/L — ABNORMAL HIGH (ref 96–106)
Creatinine, Ser: 0.83 mg/dL (ref 0.57–1.00)
Glucose: 144 mg/dL — ABNORMAL HIGH (ref 70–99)
Potassium: 4.3 mmol/L (ref 3.5–5.2)
Sodium: 146 mmol/L — ABNORMAL HIGH (ref 134–144)
eGFR: 76 mL/min/{1.73_m2} (ref >59)

## 2023-09-12 LAB — MICROALBUMIN / CREATININE URINE RATIO
Creatinine, Urine: 93.4 mg/dL
Microalb/Creat Ratio: <3 mg/g{creat} (ref 0–29)
Microalbumin, Urine: <3 ug/mL

## 2023-09-12 LAB — VITAMIN D 25 HYDROXY (VIT D DEFICIENCY, FRACTURES): Vit D, 25-Hydroxy: 31 ng/mL (ref 30.0–100.0)

## 2023-09-12 LAB — CBC
Hematocrit: 44.7 % (ref 34.0–46.6)
Hemoglobin: 14.5 g/dL (ref 11.1–15.9)
MCH: 30.7 pg (ref 26.6–33.0)
MCHC: 32.4 g/dL (ref 31.5–35.7)
MCV: 95 fL (ref 79–97)
Platelets: 119 10*3/uL — ABNORMAL LOW (ref 150–450)
RBC: 4.72 x10E6/uL (ref 3.77–5.28)
RDW: 12.9 % (ref 11.7–15.4)
WBC: 7.1 10*3/uL (ref 3.4–10.8)

## 2023-09-12 LAB — HEMOGLOBIN A1C
Est. average glucose Bld gHb Est-mCnc: 214 mg/dL
Hgb A1c MFr Bld: 9.1 % — ABNORMAL HIGH (ref 4.8–5.6)

## 2023-09-12 MED ORDER — LANTUS SOLOSTAR 100 UNIT/ML ~~LOC~~ SOPN: 40.0000 [IU] | PEN_INJECTOR | Freq: Every day | SUBCUTANEOUS | 2 refills | Status: DC

## 2023-09-12 MED ORDER — EMPAGLIFLOZIN 10 MG PO TABS: 10.0000 mg | ORAL_TABLET | Freq: Every day | ORAL | 3 refills | Status: DC

## 2023-09-12 MED ORDER — VITAMIN D (ERGOCALCIFEROL) 1.25 MG (50000 UNIT) PO CAPS: 50000.0000 [IU] | ORAL_CAPSULE | ORAL | 3 refills | Status: DC

## 2023-09-12 MED ORDER — SEMAGLUTIDE (1 MG/DOSE) 4 MG/3ML ~~LOC~~ SOPN: 1.0000 mg | PEN_INJECTOR | SUBCUTANEOUS | 0 refills | Status: DC

## 2023-09-12 MED ORDER — GABAPENTIN 300 MG PO CAPS: 300.0000 mg | ORAL_CAPSULE | Freq: Every evening | ORAL | 3 refills | Status: AC | PRN

## 2023-09-12 MED ORDER — BD PEN NEEDLE NANO U/F 32G X 4 MM MISC
6 refills | Status: AC
Start: 2023-09-12 — End: ?

## 2023-09-12 MED ORDER — ATORVASTATIN CALCIUM 40 MG PO TABS
40.0000 mg | ORAL_TABLET | Freq: Every day | ORAL | 3 refills | Status: DC
Start: 2023-09-12 — End: 2024-10-10

## 2023-09-12 MED ORDER — OZEMPIC (2 MG/DOSE) 8 MG/3ML ~~LOC~~ SOPN: 2.0000 mg | PEN_INJECTOR | SUBCUTANEOUS | 1 refills | Status: DC

## 2023-09-12 NOTE — Progress Notes (Signed)
Diabetes marker 9.1%, not at goal unfortunately.  Blood counts okay except platelets chronically low.  Sodium slightly elevated, kidney marker okay.  Still pending microalbumin kidney marker.  Vitamin D low end of normal   Recommendations: Lets increase Ozempic to 1 mg weekly for the next month then go up to 2 mg weekly.  Stop glimepiride.  Increase Lantus to 40 units daily  Vitamin D level is low. I would like you to begin prescription Vitamin D 50,000 IU weekly.  We will plan to check vitamin D periodically along with calcium, such as repeating labs in 3 months.  Foods that contain vitamin D include seafood such as oysters, shrimp, salmon, herring, cod, egg yolks, mushrooms, milk, and foods fortified with Vitamin D such as orange juice, cereals.  Its also important to get some sun exposure regularly to absorb vitamin D.  Continue rest of medicines as usual.  Follow-up in 3 months

## 2023-09-17 ENCOUNTER — Encounter: Payer: Self-pay | Admitting: Certified Registered Nurse Anesthetist

## 2023-09-17 ENCOUNTER — Other Ambulatory Visit: Payer: Self-pay | Admitting: Medical

## 2023-09-18 ENCOUNTER — Telehealth: Payer: Self-pay | Admitting: Gastroenterology

## 2023-09-18 NOTE — Telephone Encounter (Signed)
Patient called states she has had a cold since Wednesday taking Mucinex has a cough no fever. Her procedure is scheduled for 09/22/23. Please advise.

## 2023-09-18 NOTE — Telephone Encounter (Signed)
Discussed s/s pt is currently having. Currently has a cough with mucus and sinus discomfort. Mucus is thin yellow. Instructed if she does not have a fever the day of her procedure she can come but to please wear a mask. Pt states she will.

## 2023-09-22 ENCOUNTER — Ambulatory Visit (AMBULATORY_SURGERY_CENTER): Payer: Medicare Other | Admitting: Gastroenterology

## 2023-09-22 ENCOUNTER — Encounter: Payer: Self-pay | Admitting: Gastroenterology

## 2023-09-22 VITALS — BP 114/57 | HR 59 | Temp 96.2°F | Resp 12 | Ht 61.5 in | Wt 137.0 lb

## 2023-09-22 DIAGNOSIS — Z09 Encounter for follow-up examination after completed treatment for conditions other than malignant neoplasm: Secondary | ICD-10-CM

## 2023-09-22 DIAGNOSIS — Z800 Family history of malignant neoplasm of digestive organs: Secondary | ICD-10-CM

## 2023-09-22 DIAGNOSIS — Z86010 Personal history of colonic polyps: Secondary | ICD-10-CM | POA: Diagnosis not present

## 2023-09-22 MED ORDER — SODIUM CHLORIDE 0.9 % IV SOLN: 500.0000 mL | Freq: Once | INTRAVENOUS | Status: DC

## 2023-09-22 NOTE — Progress Notes (Signed)
Dexcom right tricep area  Pt's states no medical or surgical changes since previsit or office visit.

## 2023-09-22 NOTE — Progress Notes (Signed)
[facility] History and Physical   Primary Care Physician:  Jac Canavan, PA-C   Reason for Procedure:  History of adenomatous colon polyps, family h/o colon cancer  Plan:    Surveillance colonoscopy with possible interventions as needed     HPI: Bailey [name] is a very pleasant 67 y.o. female here for surveillance colonoscopy. Denies any nausea, vomiting, abdominal pain, melena or bright red blood per rectum  The risks and benefits as well as alternatives of endoscopic procedure(s) have been discussed and reviewed. All questions answered. The patient agrees to proceed.    Past Medical History:  Diagnosis Date   Arthritis    generalized   Cataract    LEFT eye   Cervical radiculopathy at C5    Dupuytren's contracture of both hands    L > R   Dyslipidemia    History of adenomatous polyp of colon    1997 and 2003     History of chronic pelvic inflammatory disease    History of gastric ulcer    2008   Hypertriglyceridemia    on meds   Osteoporosis    Right carpal tunnel syndrome    Seasonal allergies    Type 2 diabetes mellitus, uncontrolled    last A1c 8.9 on 07-07-2016   Vitamin D deficiency     Past Surgical History:  Procedure Laterality Date   CATARACT EXTRACTION Left 05/04/2023   COLONOSCOPY  2019   KN-MAC-2 day Plenvu(exc)-tics/TA-5 yr recall   DUPUYTREN / PALMAR FASCIOTOMY Bilateral    2023   ESOPHAGOGASTRODUODENOSCOPY  last one 2008   FASCIECTOMY Left 08/24/2016   Procedure: LEFT HAND PALMER AND SMALL FINGER DIGiTAL FASCIECTOMY AND RELEASE;  Surgeon: Bradly Bienenstock, MD;  Location: [facility] [place];  Service: Orthopedics;  Laterality: Left;   LAPAROSCOPIC CHOLECYSTECTOMY  07/17/2000   ROTATOR CUFF REPAIR Right 2000   TOTAL ABDOMINAL HYSTERECTOMY W/ BILATERAL SALPINGOOPHORECTOMY  1998    Prior to Admission medications   Medication Sig Start Date End Date Taking? Authorizing Provider  atorvastatin (LIPITOR) 40 MG  tablet Take 1 tablet (40 mg total) by mouth daily. 09/12/23  Yes Tysinger, Kermit Balo, PA-C  CALCIUM PO Take 600 mg by mouth daily.   Yes [provider]  ciclopirox (PENLAC) 8 % solution Apply topically at bedtime. Apply over nail and surrounding skin. Apply daily over previous coat. After seven (7) days, may remove with alcohol and continue cycle. 05/31/23  Yes Tysinger, Kermit Balo, PA-C  empagliflozin (JARDIANCE) 10 MG TABS tablet Take 1 tablet (10 mg total) by mouth daily. 09/12/23  Yes Tysinger, Kermit Balo, PA-C  gabapentin (NEURONTIN) 300 MG capsule Take 1 capsule (300 mg total) by mouth at bedtime as needed. TAKE 1 CAPSULE(300 MG) BY MOUTH AT BEDTIME 09/12/23  Yes Tysinger, Kermit Balo, PA-C  LANTUS SOLOSTAR 100 UNIT/ML Solostar Pen Inject 40 Units into the skin at bedtime. 09/12/23  Yes Tysinger, Kermit Balo, PA-C  meloxicam (MOBIC) 15 MG tablet TAKE 1 TABLET (15 MG TOTAL) BY MOUTH DAILY. 09/18/23 09/17/24 Yes Tysinger, Kermit Balo, PA-C  Multiple Vitamins-Minerals (MULTIVITAMIN WITH MINERALS) tablet Take 1 tablet by mouth every other day.    Yes [provider]  Vitamin D, Ergocalciferol, (DRISDOL) 1.25 MG (50000 UNIT) CAPS capsule Take 1 capsule (50,000 Units total) by mouth every 7 (seven) days. 09/12/23  Yes Tysinger, Kermit Balo, PA-C  Continuous Blood Gluc Receiver (DEXCOM G7 RECEIVER) DEVI UAD 11/08/22   Marcine Matar, MD  Continuous Glucose Sensor [facility]  G7 SENSOR) MISC USE TO CHECK BLOOD SUGAR THREE TIMES DAILY. CHANGE SENSORS ONCE EVERY 10 DAYS. 08/07/23   Tysinger, Kermit Balo, PA-C  Glucagon, rDNA, (GLUCAGON EMERGENCY) 1 MG KIT Inj 1 mg IM as needed for significant low blood sugar episode 11/08/22   Marcine Matar, MD  glucose blood test strip 2 (two) times daily.    [provider]  Insulin Pen Needle (BD PEN NEEDLE NANO U/F) 32G X 4 MM MISC Use once a day with insulin pen 09/12/23   Tysinger, Kermit Balo, PA-C  lidocaine (LIDODERM) 5 % Place 1 patch onto the skin daily. Remove & Discard  patch within 12 hours or as directed by MD 02/09/23   Theron Arista, PA-C  Semaglutide, 1 MG/DOSE, 4 MG/3ML SOPN Inject 1 mg into the skin once a week. 09/12/23   Tysinger, Kermit Balo, PA-C  Semaglutide, 2 MG/DOSE, (OZEMPIC, 2 MG/DOSE,) 8 MG/3ML SOPN Inject 2 mg into the skin once a week. 09/12/23   Tysinger, Kermit Balo, PA-C    Current Outpatient Medications  Medication Sig Dispense Refill   atorvastatin (LIPITOR) 40 MG tablet Take 1 tablet (40 mg total) by mouth daily. 90 tablet 3   CALCIUM PO Take 600 mg by mouth daily.     ciclopirox (PENLAC) 8 % solution Apply topically at bedtime. Apply over nail and surrounding skin. Apply daily over previous coat. After seven (7) days, may remove with alcohol and continue cycle. 6.6 mL 5   empagliflozin (JARDIANCE) 10 MG TABS tablet Take 1 tablet (10 mg total) by mouth daily. 90 tablet 3   gabapentin (NEURONTIN) 300 MG capsule Take 1 capsule (300 mg total) by mouth at bedtime as needed. TAKE 1 CAPSULE(300 MG) BY MOUTH AT BEDTIME 90 capsule 3   LANTUS SOLOSTAR 100 UNIT/ML Solostar Pen Inject 40 Units into the skin at bedtime. 15 mL 2   meloxicam (MOBIC) 15 MG tablet TAKE 1 TABLET (15 MG TOTAL) BY MOUTH DAILY. 30 tablet 0   Multiple Vitamins-Minerals (MULTIVITAMIN WITH MINERALS) tablet Take 1 tablet by mouth every other day.      Vitamin D, Ergocalciferol, (DRISDOL) 1.25 MG (50000 UNIT) CAPS capsule Take 1 capsule (50,000 Units total) by mouth every 7 (seven) days. 12 capsule 3   Continuous Blood Gluc Receiver (DEXCOM G7 RECEIVER) DEVI UAD 1 each 0   Continuous Glucose Sensor (DEXCOM G7 SENSOR) MISC USE TO CHECK BLOOD SUGAR THREE TIMES DAILY. CHANGE SENSORS ONCE EVERY 10 DAYS. 3 each 3   Glucagon, rDNA, (GLUCAGON EMERGENCY) 1 MG KIT Inj 1 mg IM as needed for significant low blood sugar episode 1 kit 0   glucose blood test strip 2 (two) times daily.     Insulin Pen Needle (BD PEN NEEDLE NANO U/F) 32G X 4 MM MISC Use once a day with insulin pen 100 each 6   lidocaine  (LIDODERM) 5 % Place 1 patch onto the skin daily. Remove & Discard patch within 12 hours or as directed by MD 30 patch 0   Semaglutide, 1 MG/DOSE, 4 MG/3ML SOPN Inject 1 mg into the skin once a week. 3 mL 0   Semaglutide, 2 MG/DOSE, (OZEMPIC, 2 MG/DOSE,) 8 MG/3ML SOPN Inject 2 mg into the skin once a week. 3 mL 1   Current Facility-Administered Medications  Medication Dose Route Frequency Provider Last Rate Last Admin   0.9 %  sodium chloride infusion  500 mL Intravenous Once Dannie Hattabaugh, Eleonore Chiquito, MD        Allergies as  of 09/22/2023 - Review Complete 09/22/2023  Allergen Reaction Noted   Metformin Diarrhea 06/13/2016   Metformin and related Diarrhea 06/13/2016   Shellfish allergy Anaphylaxis, Hives, and Swelling 08/18/2016   Ivp dye [iodinated contrast media] Hives 04/09/2013   Adhesive [tape] Other (See Comments) 08/18/2016   Chantix [varenicline] Other (See Comments) 10/05/2017   Codeine Other (See Comments) 04/09/2013   Diphenhydramine Other (See Comments) 09/01/2023   Hydrocodone-acetaminophen  09/01/2016   Hydrocodone-acetaminophen Nausea And Vomiting and Diarrhea 09/01/2016   Penicillin g  02/09/2023   Varenicline tartrate Other (See Comments) 02/09/2023   Latex Other (See Comments) and Itching 08/22/2015   Penicillins Other (See Comments) 04/19/2012    Family History  Problem Relation Age of Onset   Diabetes Mother    Hypertension Mother    Dementia Mother    Pancreatitis Mother    Asthma Mother    COPD Mother    Stroke Mother    Hyperlipidemia Mother    Heart disease Mother    Hypertension Father    Colon polyps Sister 77   Hypertension Sister    Diabetes Sister    Hyperlipidemia Sister    Hyperlipidemia Sister    Hypertension Sister    Diabetes Sister    Colon polyps Brother 22   Hypertension Brother    Diabetes Brother    Drug abuse Brother    Hyperlipidemia Brother    Colon cancer Brother 25   Hyperlipidemia Brother    Hypertension Brother    Colon  polyps Maternal Grandmother    Diabetes Maternal Grandmother    Colon cancer Maternal Grandmother    Breast cancer Neg Hx    Esophageal cancer Neg Hx    Rectal cancer Neg Hx    Stomach cancer Neg Hx     Social History   Socioeconomic History   Marital status: Married    Spouse name: Harvie Heck   Number of children: 0   Years of education: 15   Highest education level: Not on file  Occupational History   Occupation: Radio producer: [employer]     Comment: Education administrator  Tobacco Use   Smoking status: Every Day    Current packs/day: 0.25    Average packs/day: 0.3 packs/day for 28.0 years (7.0 ttl pk-yrs)    Types: Cigarettes   Smokeless tobacco: Never  Vaping Use   Vaping status: Never Used  Substance and Sexual Activity   Alcohol use: No    Alcohol/week: 0.0 standard drinks of alcohol   Drug use: No   Sexual activity: Yes    Partners: Male    Birth control/protection: Surgical  Other Topics Concern   Not on file  Social History Narrative   Currently living alone, separated from husband.  One story home.  [name]'s children are adults-2 live in [place], 2 live in [place].     Works for a [employer].     Education: 2 years of college.   Social Determinants of Health   Financial Resource Strain: Patient Declined (07/28/2023)   Overall Financial Resource Strain (CARDIA)    Difficulty of Paying Living Expenses: Patient declined  Food Insecurity: No Food Insecurity (08/01/2023)   Hunger Vital Sign    Worried About Running Out of Food in the Last Year: Never true    Ran Out of Food in the Last Year: Never true  Transportation Needs: No Transportation Needs (07/28/2023)   PRAPARE - Administrator, Civil Service (Medical): No  Lack of Transportation (Non-Medical): No  Physical Activity: Insufficiently Active (07/28/2023)   Exercise Vital Sign    Days of Exercise per Week: 3 days    Minutes of Exercise per Session: 20 min  Stress: No  Stress Concern Present (07/28/2023)   [employer] of Occupational Health - Occupational Stress Questionnaire    Feeling of Stress : Only a little  Social Connections: Unknown (07/28/2023)   Social Connection and Isolation Panel [NHANES]    Frequency of Communication with Friends and Family: More than three times a week    Frequency of Social Gatherings with Friends and Family: More than three times a week    Attends Religious Services: Not on Marketing executive or Organizations: Patient declined    Attends Banker Meetings: Patient declined    Marital Status: Patient declined  Intimate Partner Violence: Not At Risk (08/01/2023)   Humiliation, Afraid, Rape, and Kick questionnaire    Fear of Current or Ex-Partner: No    Emotionally Abused: No    Physically Abused: No    Sexually Abused: No    Review of Systems:  All other review of systems negative except as mentioned in the HPI.  Physical Exam: Vital signs in last 24 hours: BP 118/63   Pulse 71   Temp (!) 96.2 F (35.7 C)   Ht 5' 1.5" (1.562 m)   Wt 137 lb (62.1 kg)   SpO2 100%   BMI 25.47 kg/m  General:   Alert, NAD Lungs:  Clear .   Heart:  Regular rate and rhythm Abdomen:  Soft, nontender and nondistended. Neuro/Psych:  Alert and cooperative. Normal mood and affect. A and O x 3  Reviewed labs, radiology imaging, old records and pertinent past GI work up  Patient is appropriate for planned procedure(s) and anesthesia in an ambulatory setting   K. Scherry Ran , MD [phone]

## 2023-09-22 NOTE — Patient Instructions (Addendum)
- 

## 2023-09-22 NOTE — Op Note (Signed)
[facility] Patient Name: Bailey [name] Procedure Date: 09/22/2023 10:19 AM MRN: [id number] Endoscopist: Napoleon Form , MD, [id number] Age: 67 Referring MD:  Date of Birth: [birth year] Gender: Female Account #: [id number] Procedure:                Colonoscopy Indications:              Screening in patient at increased risk: Colorectal                            cancer in brother before age 18, High risk colon                            cancer surveillance: Personal history of colonic                            polyps Medicines:                Monitored Anesthesia Care Procedure:                Pre-Anesthesia Assessment:                           - Prior to the procedure, a History and Physical                            was performed, and patient medications and                            allergies were reviewed. The patient's tolerance of                            previous anesthesia was also reviewed. The risks                            and benefits of the procedure and the sedation                            options and risks were discussed with the patient.                            All questions were answered, and informed consent                            was obtained. Prior Anticoagulants: The patient has                            taken no anticoagulant or antiplatelet agents. ASA                            Grade Assessment: II - A patient with mild systemic                            disease. After reviewing the risks and benefits,  the patient was deemed in satisfactory condition to                            undergo the procedure.                           After obtaining informed consent, the colonoscope                            was passed under direct vision. Throughout the                            procedure, the patient's blood pressure, pulse, and                            oxygen saturations were monitored  continuously. The                            Olympus PCF-[id number] (#[id number]) Colonoscope was                            introduced through the anus and advanced to the the                            cecum, identified by appendiceal orifice and                            ileocecal valve. The colonoscopy was performed                            without difficulty. The patient tolerated the                            procedure well. The quality of the bowel                            preparation was good. The ileocecal valve,                            appendiceal orifice, and rectum were photographed. Scope In: 10:35:30 AM Scope Out: 10:45:32 AM Scope Withdrawal Time: 0 hours 7 minutes 56 seconds  Total Procedure Duration: 0 hours 10 minutes 2 seconds  Findings:                 The perianal and digital rectal examinations were                            normal.                           Scattered large-mouthed, medium-mouthed and                            small-mouthed diverticula were found in the sigmoid  colon, descending colon, transverse colon and                            ascending colon.                           Non-bleeding external and internal hemorrhoids were                            found during retroflexion. The hemorrhoids were                            medium-sized. Complications:            No immediate complications. Estimated Blood Loss:     Estimated blood loss was minimal. Impression:               - Moderate diverticulosis in the sigmoid colon, in                            the descending colon, in the transverse colon and                            in the ascending colon.                           - Non-bleeding external and internal hemorrhoids.                           - No specimens collected. Recommendation:           - Patient has a contact number available for                            emergencies. The signs and symptoms of  potential                            delayed complications were discussed with the                            patient. Return to normal activities tomorrow.                            Written discharge instructions were provided to the                            patient.                           - Resume previous diet.                           - Continue present medications.                           - Repeat colonoscopy in 5 years for surveillance. Napoleon Form, MD 09/22/2023 10:55:17 AM This report has been signed electronically.

## 2023-09-22 NOTE — Progress Notes (Signed)
Report given to PACU, vss 

## 2023-09-25 ENCOUNTER — Telehealth: Payer: Self-pay

## 2023-09-25 NOTE — Telephone Encounter (Signed)
Follow up call to pt, lm for pt to call if having any difficulty with normal activities or eating and drinking.  Also to call if any other questions or concerns.  

## 2023-09-28 ENCOUNTER — Ambulatory Visit (HOSPITAL_COMMUNITY)
Admission: RE | Admit: 2023-09-28 | Discharge: 2023-09-28 | Disposition: A | Discharge status: Home / Self Care | Payer: Medicare Other | Source: Ambulatory Visit | Attending: Medical | Admitting: Medical

## 2023-09-28 DIAGNOSIS — Z136 Encounter for screening for cardiovascular disorders: Secondary | ICD-10-CM | POA: Insufficient documentation

## 2023-09-28 DIAGNOSIS — Z0000 Encounter for general adult medical examination without abnormal findings: Secondary | ICD-10-CM | POA: Insufficient documentation

## 2023-09-29 NOTE — Progress Notes (Signed)
Results sent through MyChart

## 2023-10-02 ENCOUNTER — Ambulatory Visit (HOSPITAL_COMMUNITY)
Admission: RE | Admit: 2023-10-02 | Discharge: 2023-10-02 | Disposition: A | Discharge status: Home / Self Care | Payer: Medicare Other | Source: Ambulatory Visit | Attending: Medical | Admitting: Medical

## 2023-10-02 DIAGNOSIS — R0989 Other specified symptoms and signs involving the circulatory and respiratory systems: Secondary | ICD-10-CM | POA: Diagnosis not present

## 2023-10-02 DIAGNOSIS — Z136 Encounter for screening for cardiovascular disorders: Secondary | ICD-10-CM | POA: Diagnosis not present

## 2023-10-02 DIAGNOSIS — Z0000 Encounter for general adult medical examination without abnormal findings: Secondary | ICD-10-CM | POA: Insufficient documentation

## 2023-10-02 LAB — VAS US ABI WITH/WO TBI
Left ABI: 1.11
Right ABI: 1.11

## 2023-10-02 NOTE — Progress Notes (Signed)
Your ABI blood flow screening your legs is normal thankfully

## 2023-10-03 ENCOUNTER — Other Ambulatory Visit: Payer: Self-pay | Admitting: Medical

## 2023-10-03 NOTE — Telephone Encounter (Signed)
Dose has been increased 

## 2023-10-13 NOTE — Progress Notes (Signed)
Results sent through MyChart

## 2023-10-24 ENCOUNTER — Other Ambulatory Visit: Payer: Self-pay | Admitting: Medical

## 2023-10-28 DIAGNOSIS — H1033 Unspecified acute conjunctivitis, bilateral: Secondary | ICD-10-CM | POA: Diagnosis not present

## 2023-11-01 DIAGNOSIS — H04123 Dry eye syndrome of bilateral lacrimal glands: Secondary | ICD-10-CM | POA: Diagnosis not present

## 2023-11-01 DIAGNOSIS — H10413 Chronic giant papillary conjunctivitis, bilateral: Secondary | ICD-10-CM | POA: Diagnosis not present

## 2023-11-23 ENCOUNTER — Other Ambulatory Visit: Payer: Self-pay | Admitting: Medical

## 2023-11-26 ENCOUNTER — Other Ambulatory Visit: Payer: Self-pay | Admitting: Medical

## 2023-11-28 ENCOUNTER — Ambulatory Visit
Admission: RE | Admit: 2023-11-28 | Discharge: 2023-11-28 | Disposition: A | Discharge status: Home / Self Care | Payer: Medicare Other | Source: Ambulatory Visit | Attending: Medical | Admitting: Medical

## 2023-11-28 DIAGNOSIS — R928 Other abnormal and inconclusive findings on diagnostic imaging of breast: Secondary | ICD-10-CM

## 2023-11-28 NOTE — Progress Notes (Signed)
Results sent through MyChart

## 2023-11-29 DIAGNOSIS — H524 Presbyopia: Secondary | ICD-10-CM | POA: Diagnosis not present

## 2023-11-29 DIAGNOSIS — H43821 Vitreomacular adhesion, right eye: Secondary | ICD-10-CM | POA: Diagnosis not present

## 2023-11-29 DIAGNOSIS — H2511 Age-related nuclear cataract, right eye: Secondary | ICD-10-CM | POA: Diagnosis not present

## 2023-11-29 DIAGNOSIS — H5201 Hypermetropia, right eye: Secondary | ICD-10-CM | POA: Diagnosis not present

## 2023-11-29 DIAGNOSIS — E119 Type 2 diabetes mellitus without complications: Secondary | ICD-10-CM | POA: Diagnosis not present

## 2023-11-29 DIAGNOSIS — H43813 Vitreous degeneration, bilateral: Secondary | ICD-10-CM | POA: Diagnosis not present

## 2023-11-29 DIAGNOSIS — H25011 Cortical age-related cataract, right eye: Secondary | ICD-10-CM | POA: Diagnosis not present

## 2023-11-29 LAB — HM DIABETES EYE EXAM

## 2023-12-01 ENCOUNTER — Encounter: Payer: Self-pay | Admitting: Internal Medicine

## 2023-12-07 DIAGNOSIS — H25811 Combined forms of age-related cataract, right eye: Secondary | ICD-10-CM | POA: Diagnosis not present

## 2023-12-07 DIAGNOSIS — H2511 Age-related nuclear cataract, right eye: Secondary | ICD-10-CM | POA: Diagnosis not present

## 2023-12-07 DIAGNOSIS — H25011 Cortical age-related cataract, right eye: Secondary | ICD-10-CM | POA: Diagnosis not present

## 2023-12-23 ENCOUNTER — Other Ambulatory Visit: Payer: Self-pay | Admitting: Medical

## 2024-01-10 DIAGNOSIS — H43821 Vitreomacular adhesion, right eye: Secondary | ICD-10-CM | POA: Diagnosis not present

## 2024-01-25 ENCOUNTER — Ambulatory Visit (INDEPENDENT_AMBULATORY_CARE_PROVIDER_SITE_OTHER): Payer: Medicare Other | Admitting: Medical

## 2024-01-25 ENCOUNTER — Encounter: Payer: Self-pay | Admitting: Medical

## 2024-01-25 VITALS — BP 124/76 | HR 52 | Wt 133.8 lb

## 2024-01-25 DIAGNOSIS — E1169 Type 2 diabetes mellitus with other specified complication: Secondary | ICD-10-CM

## 2024-01-25 DIAGNOSIS — E1165 Type 2 diabetes mellitus with hyperglycemia: Secondary | ICD-10-CM

## 2024-01-25 DIAGNOSIS — R252 Cramp and spasm: Secondary | ICD-10-CM | POA: Diagnosis not present

## 2024-01-25 DIAGNOSIS — Z79899 Other long term (current) drug therapy: Secondary | ICD-10-CM | POA: Diagnosis not present

## 2024-01-25 DIAGNOSIS — E782 Mixed hyperlipidemia: Secondary | ICD-10-CM

## 2024-01-25 NOTE — Progress Notes (Signed)
Subjective:  Bailey [name] is a 68 y.o. female who presents for Chief Complaint  Patient presents with   Diabetes    Diabetes check, blood sugar drops all the sudden and sometimes high, DEXCOMG7 right now it is 113 glucose last 7 days average in the 150's. House call visit from insurance company on 01/03/24     Here for med check  Diabetes-she is using Dexcom and she has been seeing averages around 150 but there is some occasional higher numbers and she is getting somewhat frequent low readings as well.  Particular around 2 AM she is getting some alerts that her sugars too low.  She denies any shakiness, sweats, confusion or slurred speech though.  She is using Ozempic 2 mg weekly, Jardiance 10 mg daily and Lantus 40 units nightly.  She is compliant with her cholesterol medicine without complaint  She is compliant with her other medicines without complaint  She cooks a lot at home and eats healthy 90% of the time.  She occasionally will see a high number when she eats something sweet or drinks a soda which she does not do very often  Lately she has had some aches in her left lower leg, cramps, particularly in the middle of the night.  No leg numbness or tingling or weakness.  No back or abdominal pain  No other aggravating or relieving factors.    No other c/o.  Past Medical History:  Diagnosis Date   Arthritis    generalized   Cataract    LEFT eye   Cervical radiculopathy at C5    Dupuytren's contracture of both hands    L > R   Dyslipidemia    History of adenomatous polyp of colon    1997 and 2003     History of chronic pelvic inflammatory disease    History of gastric ulcer    2008   Hypertriglyceridemia    on meds   Osteoporosis    Right carpal tunnel syndrome    Seasonal allergies    Type 2 diabetes mellitus, uncontrolled    last A1c 8.9 on 07-07-2016   Vitamin D deficiency    Current Outpatient Medications on File Prior to Visit  Medication Sig  Dispense Refill   atorvastatin (LIPITOR) 40 MG tablet Take 1 tablet (40 mg total) by mouth daily. 90 tablet 3   CALCIUM PO Take 600 mg by mouth daily.     ciclopirox (PENLAC) 8 % solution Apply topically at bedtime. Apply over nail and surrounding skin. Apply daily over previous coat. After seven (7) days, may remove with alcohol and continue cycle. 6.6 mL 5   Continuous Blood Gluc Receiver (DEXCOM G7 RECEIVER) DEVI UAD 1 each 0   Continuous Glucose Sensor (DEXCOM G7 SENSOR) MISC USE TO CHECK BLOOD SUGAR THREE TIMES DAILY. CHANGE SENSORS ONCE EVERY 10 DAYS. 3 each 3   empagliflozin (JARDIANCE) 10 MG TABS tablet Take 1 tablet (10 mg total) by mouth daily. 90 tablet 3   gabapentin (NEURONTIN) 300 MG capsule Take 1 capsule (300 mg total) by mouth at bedtime as needed. TAKE 1 CAPSULE(300 MG) BY MOUTH AT BEDTIME 90 capsule 3   Glucagon, rDNA, (GLUCAGON EMERGENCY) 1 MG KIT Inj 1 mg IM as needed for significant low blood sugar episode 1 kit 0   glucose blood test strip 2 (two) times daily.     Insulin Pen Needle (BD PEN NEEDLE NANO U/F) 32G X 4 MM MISC Use once a day with insulin  pen 100 each 6   LANTUS SOLOSTAR 100 UNIT/ML Solostar Pen Inject 40 Units into the skin at bedtime. 15 mL 2   lidocaine (LIDODERM) 5 % Place 1 patch onto the skin daily. Remove & Discard patch within 12 hours or as directed by MD 30 patch 0   meloxicam (MOBIC) 15 MG tablet TAKE 1 TABLET (15 MG TOTAL) BY MOUTH DAILY. 30 tablet 0   Multiple Vitamins-Minerals (MULTIVITAMIN WITH MINERALS) tablet Take 1 tablet by mouth every other day.      Semaglutide, 2 MG/DOSE, (OZEMPIC, 2 MG/DOSE,) 8 MG/3ML SOPN Inject 2 mg into the skin once a week. 3 mL 1   Vitamin D, Ergocalciferol, (DRISDOL) 1.25 MG (50000 UNIT) CAPS capsule Take 1 capsule (50,000 Units total) by mouth every 7 (seven) days. 12 capsule 3   No current facility-administered medications on file prior to visit.     The following portions of the patient's history were reviewed  and updated as appropriate: allergies, current medications, past family history, past medical history, past social history, past surgical history and problem list.  ROS Otherwise as in subjective above  Objective: BP 124/76   Pulse (!) 52   Wt 133 lb 12.8 oz (60.7 kg)   BMI 24.87 kg/m   General appearance: alert, no distress, well developed, well nourished Neck: supple, no lymphadenopathy, no thyromegaly, no masses Heart: RRR, normal S1, S2, no murmurs Lungs: CTA bilaterally, no wheezes, rhonchi, or rales Abdomen: +bs, soft, non tender, non distended, no masses, no hepatomegaly, no splenomegaly Pulses: 2+ radial pulses, 2+ pedal pulses, normal cap refill Ext: no edema  Diabetic Foot Exam - Simple   Simple Foot Form Diabetic Foot exam was performed with the following findings: Yes 01/25/2024 11:20 AM  Visual Inspection No deformities, no ulcerations, no other skin breakdown bilaterally: Yes Sensation Testing Intact to touch and monofilament testing bilaterally: Yes Pulse Check See comments: Yes Comments 1+ pedal pulses      Assessment: Encounter Diagnoses  Name Primary?   Uncontrolled type 2 diabetes mellitus with hyperglycemia (HCC) Yes   Dyslipidemia with low high density lipoprotein (HDL) cholesterol with hypertriglyceridemia due to type 2 diabetes mellitus (HCC)    Muscle cramp    High risk medication use      Plan: Diabetes We discussed that we do not want to see hypoglycemia at all.  We will adjust her regimen today.  Continue Jardiance 10 mg daily, continue Ozempic 2 mg weekly since she is not having any particular problems with this, but decrease Lantus from 40 units down to 30 units daily.  Continue to monitor Dexcom readings.  If any additional low readings within the next couple weeks then let me know right away.  Counseled on diet and exercise and avoid sweets or unnecessary foods is going to raise her sugars.  Fortunately she cooks 90% of the time at home and  eats healthy most of the time.  Dyslipidemia-continue Lipitor 40 mg daily  Muscle cramps-can use a teaspoon of mustard daily.  Labs today to further evaluate. Continue good water intake  High risk medication use-labs as below  Bailey [name] was seen today for diabetes.  Diagnoses and all orders for this visit:  Uncontrolled type 2 diabetes mellitus with hyperglycemia (HCC) -     Comprehensive metabolic panel -     Hemoglobin A1c  Dyslipidemia with low high density lipoprotein (HDL) cholesterol with hypertriglyceridemia due to type 2 diabetes mellitus (HCC) -     Comprehensive metabolic panel  Muscle cramp -     CK -     Comprehensive metabolic panel -     Magnesium  High risk medication use -     CK -     Comprehensive metabolic panel    Follow up: pending labs

## 2024-01-26 ENCOUNTER — Other Ambulatory Visit: Payer: Self-pay | Admitting: Medical

## 2024-01-26 DIAGNOSIS — Z794 Long term (current) use of insulin: Secondary | ICD-10-CM

## 2024-01-26 LAB — COMPREHENSIVE METABOLIC PANEL
ALT: 34 [IU]/L — ABNORMAL HIGH (ref 0–32)
AST: 23 [IU]/L (ref 0–40)
Albumin: 4.2 g/dL (ref 3.9–4.9)
Alkaline Phosphatase: 100 [IU]/L (ref 44–121)
BUN/Creatinine Ratio: 15 (ref 12–28)
BUN: 12 mg/dL (ref 8–27)
Bilirubin Total: 0.4 mg/dL (ref 0.0–1.2)
CO2: 22 mmol/L (ref 20–29)
Calcium: 9.5 mg/dL (ref 8.7–10.3)
Chloride: 106 mmol/L (ref 96–106)
Creatinine, Ser: 0.82 mg/dL (ref 0.57–1.00)
Globulin, Total: 2.5 g/dL (ref 1.5–4.5)
Glucose: 116 mg/dL — ABNORMAL HIGH (ref 70–99)
Potassium: 4.3 mmol/L (ref 3.5–5.2)
Sodium: 145 mmol/L — ABNORMAL HIGH (ref 134–144)
Total Protein: 6.7 g/dL (ref 6.0–8.5)
eGFR: 77 mL/min/{1.73_m2} (ref >59)

## 2024-01-26 LAB — HEMOGLOBIN A1C
Est. average glucose Bld gHb Est-mCnc: 200 mg/dL
Hgb A1c MFr Bld: 8.6 % — ABNORMAL HIGH (ref 4.8–5.6)

## 2024-01-26 LAB — MAGNESIUM: Magnesium: 2.1 mg/dL (ref 1.6–2.3)

## 2024-01-26 LAB — CK: Total CK: 180 U/L (ref 32–182)

## 2024-01-26 MED ORDER — OZEMPIC (2 MG/DOSE) 8 MG/3ML ~~LOC~~ SOPN: 2.0000 mg | PEN_INJECTOR | SUBCUTANEOUS | 2 refills | Status: DC

## 2024-01-26 MED ORDER — LANTUS SOLOSTAR 100 UNIT/ML ~~LOC~~ SOPN: 35.0000 [IU] | PEN_INJECTOR | Freq: Every day | SUBCUTANEOUS | 2 refills | Status: DC

## 2024-01-26 MED ORDER — GLUCAGON EMERGENCY 1 MG IJ KIT
PACK | INTRAMUSCULAR | 0 refills | Status: AC
Start: 2024-01-26 — End: ?

## 2024-01-26 NOTE — Progress Notes (Signed)
 Results sent through MyChart

## 2024-02-02 ENCOUNTER — Other Ambulatory Visit: Payer: Self-pay | Admitting: Medical

## 2024-02-05 ENCOUNTER — Telehealth: Payer: Self-pay | Admitting: Internal Medicine

## 2024-02-05 NOTE — Telephone Encounter (Signed)
 Patient was identified as falling into the [place] Measure - Diabetes.   Patient was: Appointment scheduled with primary care provider in the next 30 days.

## 2024-02-09 ENCOUNTER — Ambulatory Visit: Payer: 59 | Admitting: Nurse Practitioner

## 2024-02-09 ENCOUNTER — Encounter: Payer: Self-pay | Admitting: Nurse Practitioner

## 2024-02-09 VITALS — BP 130/82 | HR 86 | Wt 134.2 lb

## 2024-02-09 DIAGNOSIS — B029 Zoster without complications: Secondary | ICD-10-CM | POA: Insufficient documentation

## 2024-02-09 DIAGNOSIS — B028 Zoster with other complications: Secondary | ICD-10-CM

## 2024-02-09 MED ORDER — VALACYCLOVIR HCL 1 G PO TABS
1000.0000 mg | ORAL_TABLET | Freq: Two times a day (BID) | ORAL | 0 refills | Status: AC
Start: 2024-02-09 — End: 2024-02-19

## 2024-02-09 MED ORDER — ACYCLOVIR 5 % EX CREA
1.0000 | TOPICAL_CREAM | CUTANEOUS | 2 refills | Status: DC
Start: 2024-02-09 — End: 2024-05-07

## 2024-02-09 NOTE — Assessment & Plan Note (Signed)
Herpes Zoster presenting with a sore and aching sensation along the facial nerve, extending from the ear to the neck, with a facial rash noted since Sunday. Only one dose of the shingles vaccine received, potentially contributing to the outbreak. No ocular involvement. History of chickenpox, consistent with varicella-zoster virus reactivation. Discussed the shingles vaccine series (two doses) providing ~80% protection. Explained virus attachment to nerve causing pain. Discussed Valacyclovir for resolution and gabapentin for nerve pain management. Gabapentin can be taken up to three times daily if not excessively sedating. She requests additional topical treatment, which was provided with acyclovir.  - Prescribe Valacyclovir BID for 10 days - Prescribe topical cream acyclovir) for application up to every 3 hours - Advise gabapentin at night for nerve pain- she does have this at home.  - Recommend daytime gabapentin if not excessively sedating - Provide shingles management information

## 2024-02-09 NOTE — Progress Notes (Signed)
Tollie Eth, DNP, AGNP-c [facility] [address] [place], [place] [address] [phone]   ACUTE VISIT- ESTABLISHED PATIENT  Blood pressure 130/82, pulse 86, weight 134 lb 3.2 oz (60.9 kg), SpO2 98%.  Subjective:  HPI Bailey [name] is a 68 y.o. female presents to day for evaluation of acute concern(s).   History of Present Illness Bailey [name] is a 68 year old female who presents with facial pain, particularly in her right ear and throat.   She has been experiencing soreness and aching in her forehead, cheek, and ear, which feels warm and extends downwards, along with throat soreness that began last Sunday. All symptoms are on the right side. Initially, she monitored these symptoms to see if she would resolve on her own, but they have worsened, prompting her to seek medical attention. She noticed a rash near her mouth on the same side of the face yesterday morning. The rash is localized and not severe. No involvement of the eye with the rash.  She has a history of chickenpox and received one dose of the shingles vaccine at [facility], though she is unsure of the exact timing.  She has been experiencing increased stress but has been managing it. She takes gabapentin as needed, usually at night, and has a 90-day supply remaining.  She mentions having cataract surgery on her eye about two months ago.  ROS negative except for what is listed in HPI. History, Medications, Surgery, SDOH, and Family History reviewed and updated as appropriate.  Objective:  Physical Exam Vitals and nursing note reviewed.  Constitutional:      General: She is not in acute distress.    Appearance: She is not toxic-appearing.  HENT:     Head: Normocephalic and atraumatic.      Comments: Papular rash noted on the right side of the chin extending in a linear fashion towards the cheek. Consistent with shingles.     Right Ear: Tympanic membrane normal.     Nose: Nose  normal.     Mouth/Throat:     Mouth: Mucous membranes are moist.     Pharynx: Oropharynx is clear.  Eyes:     Extraocular Movements: Extraocular movements intact.     Conjunctiva/sclera: Conjunctivae normal.     Pupils: Pupils are equal, round, and reactive to light.  Neck:     Vascular: No carotid bruit.  Cardiovascular:     Rate and Rhythm: Normal rate and regular rhythm.     Pulses: Normal pulses.     Heart sounds: Normal heart sounds.  Pulmonary:     Effort: Pulmonary effort is normal.     Breath sounds: Normal breath sounds.  Lymphadenopathy:     Cervical: No cervical adenopathy.  Skin:    General: Skin is warm and dry.     Findings: Rash present.  Neurological:     Mental Status: She is alert.         Assessment & Plan:   Problem List Items Addressed This Visit     Shingles - Primary   Herpes Zoster presenting with a sore and aching sensation along the facial nerve, extending from the ear to the neck, with a facial rash noted since Sunday. Only one dose of the shingles vaccine received, potentially contributing to the outbreak. No ocular involvement. History of chickenpox, consistent with varicella-zoster virus reactivation. Discussed the shingles vaccine series (two doses) providing ~80% protection. Explained virus attachment to nerve causing pain. Discussed Valacyclovir for resolution and gabapentin  for nerve pain management. Gabapentin can be taken up to three times daily if not excessively sedating. She requests additional topical treatment, which was provided with acyclovir.  - Prescribe Valacyclovir BID for 10 days - Prescribe topical cream acyclovir) for application up to every 3 hours - Advise gabapentin at night for nerve pain- she does have this at home.  - Recommend daytime gabapentin if not excessively sedating - Provide shingles management information      Relevant Medications   valACYclovir (VALTREX) 1000 MG tablet   acyclovir cream (ZOVIRAX) 5 %       Tollie Eth, DNP, AGNP-c

## 2024-02-09 NOTE — Patient Instructions (Addendum)
I have sent in a medication called Valacyclovir for the rash. You take this in the morning and in the evening for 10 days. This will help the rash go away.  I also sent in a cream that you can use on the rash to help it heal.   You can take the gabapentin 300mg  in the morning, at noon, and at bedtime for the pain. This is the best treatment to help with the pain. Tylenol can be used, as well.    Nerve Pain After Shingles Postherpetic neuralgia (PHN) is nerve pain you may get after you have shingles. Shingles is an infection that causes a painful rash and blisters. It's caused by the same germ that causes chickenpox. PHN affects the spot on your body where you had the shingles rash. It can last for 3 months after your rash has gone away. What are the causes? PHN may be caused by damage to your nerves. This damage may come from swelling from the shingles infection. What increases the risk? You may be more likely to get PHN if: You're older than 70 years of age. You have severe pain before your rash starts. You have a very bad rash. You have shingles in and around your eye. Your body defense system (immune system) is weak. What are the signs or symptoms? The main symptom of PHN is pain. The pain may: Be stabbing, burning, or shooting. Feel like an electric shock. Come and go, or it may be there all the time. Get worse if: Something touches your skin. The temperature goes up or down. You may also have itching. How is this diagnosed? PHN may be diagnosed based on: Your symptoms. Whether you've had shingles before. How is this treated? There's no cure, but treatment can help with the pain. Normal pain medicines may not help. You may need to work with an expert in treating pain to find what works best for you. Treatment may include: Anti-seizure medicines. Antidepressants. Strong pain medicines. A numbing patch worn on the skin. Shots of: Numbing medicines. Medicines to treat  inflammation. Botulinum toxin. This can block pain signals and stop you from feeling pain. Follow these instructions at home: Medicines Take over-the-counter and prescription medicines only as told by your health care provider. Ask your provider if the medicine prescribed to you: Requires you to avoid driving or using machinery. Can cause trouble pooping or constipation. You may need to take these actions to prevent or treat trouble pooping: Drink enough fluid to keep your pee (urine) pale yellow. Take over-the-counter or prescription medicines. Eat foods that are high in fiber, such as beans, whole grains, and fresh fruits and vegetables. Limit foods that are high in fat and processed sugars, such as fried or sweet foods. Managing pain  If told, put ice on the painful area. Put ice in a plastic bag. Place a towel between your skin and the bag. Leave the ice on for 20 minutes, 2-3 times a day. If your skin turns bright red, remove the ice right away to prevent skin damage. The risk of damage is higher if you can't feel pain, heat, or cold. Cover sensitive spots with a bandage, or dressing, to stop clothes from rubbing. Wear loose clothes. General instructions It may take a long time for you to get better. Work closely with your provider. Think about talking with a mental health care provider. They can help you find ways to cope with feeling overwhelmed or hopeless. Have a good support system  at home. Think about joining a pain support group. How is this prevented? Vaccines are the best way to prevent shingles and PHN. You should get the vaccine shot for shingles once you're older than 70 years of age. Talk with your provider about getting the shot. Contact a health care provider if: Your medicine isn't helping. You can't manage your pain at home. You feel sad or depressed. Get help right away if: You have thoughts about hurting yourself or others. Get help right away if you feel  like you may hurt yourself or others, or have thoughts about taking your own life. Go to your nearest emergency room or: Call 911. Call the National Suicide Prevention Lifeline at 4751780916 or 988. This is open 24 hours a day. Text the Crisis Text Line at (343)485-0591. This information is not intended to replace advice given to you by your health care provider. Make sure you discuss any questions you have with your health care provider. Document Revised: 03/16/2023 Document Reviewed: 03/16/2023 Elsevier Patient Education  2024 [employer].

## 2024-02-13 ENCOUNTER — Telehealth: Payer: Self-pay

## 2024-02-13 DIAGNOSIS — E1165 Type 2 diabetes mellitus with hyperglycemia: Secondary | ICD-10-CM

## 2024-02-13 NOTE — Progress Notes (Signed)
   02/13/2024 Name: Bailey [name] MRN: [id number] DOB: [birth year]  Chief Complaint  Patient presents with   Diabetes    [name] is a 68 y.o. year old female who presented for a telephone visit.   They were referred to the pharmacist  for assistance in managing diabetes.    Contacted patient via telephone to follow up on elevated A1C and recent insulin decrease at last PCP appt.   Patient denies any more lows since decreasing her insulin dose. Agrees to appt in office on 02/22/24 to help setup Dexcom app on her phone and review her BG.   Sherrill Raring, PharmD Clinical Pharmacist [phone]

## 2024-02-20 ENCOUNTER — Telehealth: Payer: Self-pay

## 2024-02-20 ENCOUNTER — Other Ambulatory Visit (HOSPITAL_COMMUNITY): Payer: Self-pay

## 2024-02-20 NOTE — Telephone Encounter (Addendum)
 Pharmacy Patient Advocate Encounter   Received notification from CoverMyMeds that prior authorization for Acyclovir 5% cream  is required/requested for a formulary exception.   Insurance verification completed.   The patient is insured through [facility] MEDICAID .   Per test claim: PA required; PA submitted to above mentioned insurance via CoverMyMeds Key/confirmation #/EOC Key: [id number]   Status is pending

## 2024-02-21 NOTE — Telephone Encounter (Addendum)
 UPDATE:  I wanted to confirm with the pharmacy -pt has picked up the RX  as of today 2/26/25at the pharmacy for 0.00.   Rx was filled &picked up. If I need to re access this in the future then I will , but as for now the patient has picked up

## 2024-02-21 NOTE — Telephone Encounter (Signed)
 Noted.

## 2024-02-21 NOTE — Telephone Encounter (Signed)
 Pharmacy Patient Advocate Encounter  Received notification from [facility] MEDICAID that Prior Authorization for  Acyclovir 5% cream  has been DENIED.  Full denial letter will be uploaded to the media tab. See denial reason below.

## 2024-02-22 ENCOUNTER — Ambulatory Visit (INDEPENDENT_AMBULATORY_CARE_PROVIDER_SITE_OTHER): Payer: 59

## 2024-02-22 ENCOUNTER — Ambulatory Visit: Payer: 59 | Admitting: Medical

## 2024-02-22 VITALS — BP 120/78 | HR 72 | Temp 98.2°F

## 2024-02-22 DIAGNOSIS — E11649 Type 2 diabetes mellitus with hypoglycemia without coma: Secondary | ICD-10-CM

## 2024-02-22 DIAGNOSIS — R21 Rash and other nonspecific skin eruption: Secondary | ICD-10-CM | POA: Diagnosis not present

## 2024-02-22 DIAGNOSIS — Z794 Long term (current) use of insulin: Secondary | ICD-10-CM

## 2024-02-22 NOTE — Progress Notes (Signed)
   02/22/2024  Patient ID: Bailey [name], female   DOB: [birth year], 68 y.o.   MRN: [id number]  Patient presented in office for setup of Dexcom G7 app. Has successfully been using sensor with receiver device but interested in using app as well.  Setup account for patient. Sensor patient is currently wearing only has 4 days left and will not pair to app at this time.  Patient will try when it is time to change sensor and will notify if any concerns. Patient also complains of arm soreness around sensor, even though it appears to be placed correctly. Reviewed proper application steps and tips for easy attachment.  Follow Up: 1 week

## 2024-02-22 NOTE — Progress Notes (Signed)
 Subjective:  Bailey [name] is a 68 y.o. female who presents for Chief Complaint  Patient presents with   Follow-up    Shingles- has a rash around mouth and the acyclovir cream said don't put on face     Here for rash and follow up from recent visit here on 02/09/24 with Sarabeth.  She was seen at that time for right-sided ear pain facial pain, sinus pressure and neck pain and rash apparently that was on the right side.  She was treated with Valtrex and acyclovir cream topically.  Since then the symptoms have subsided except for a slight faint bumpy rash around her mouth in general.  Acyclovir cream is not helping the rash but all the other symptoms have resolved.  She finished the Valtrex.  She never got a blistery rash after last visit.  At this point she is just concerned about the rash.  She is using Aloveita lotion  She is seeing the pharmacist today here regarding her glucose reader  No other aggravating or relieving factors.    No other c/o.  Past Medical History:  Diagnosis Date   Arthritis    generalized   Cataract    LEFT eye   Cervical radiculopathy at C5    Dupuytren's contracture of both hands    L > R   Dyslipidemia    History of adenomatous polyp of colon    1997 and 2003     History of chronic pelvic inflammatory disease    History of gastric ulcer    2008   Hypertriglyceridemia    on meds   Osteoporosis    Right carpal tunnel syndrome    Seasonal allergies    Type 2 diabetes mellitus, uncontrolled    last A1c 8.9 on 07-07-2016   Vitamin D deficiency    Current Outpatient Medications on File Prior to Visit  Medication Sig Dispense Refill   acyclovir cream (ZOVIRAX) 5 % Apply 1 Application topically every 4 (four) hours. May use for up to 7 days 15 g 2   atorvastatin (LIPITOR) 40 MG tablet Take 1 tablet (40 mg total) by mouth daily. 90 tablet 3   CALCIUM PO Take 600 mg by mouth daily.     ciclopirox (PENLAC) 8 % solution Apply topically at  bedtime. Apply over nail and surrounding skin. Apply daily over previous coat. After seven (7) days, may remove with alcohol and continue cycle. 6.6 mL 5   Continuous Blood Gluc Receiver (DEXCOM G7 RECEIVER) DEVI UAD 1 each 0   Continuous Glucose Sensor (DEXCOM G7 SENSOR) MISC USE TO CHECK BLOOD SUGAR THREE TIMES DAILY. CHANGE SENSORS ONCE EVERY 10 DAYS. 3 each 3   empagliflozin (JARDIANCE) 10 MG TABS tablet Take 1 tablet (10 mg total) by mouth daily. 90 tablet 3   gabapentin (NEURONTIN) 300 MG capsule Take 1 capsule (300 mg total) by mouth at bedtime as needed. TAKE 1 CAPSULE(300 MG) BY MOUTH AT BEDTIME 90 capsule 3   Glucagon, rDNA, (GLUCAGON EMERGENCY) 1 MG KIT Inj 1 mg IM as needed for significant low blood sugar episode 1 kit 0   glucose blood test strip 2 (two) times daily.     Insulin Pen Needle (BD PEN NEEDLE NANO U/F) 32G X 4 MM MISC Use once a day with insulin pen 100 each 6   LANTUS SOLOSTAR 100 UNIT/ML Solostar Pen Inject 35 Units into the skin at bedtime. 15 mL 2   lidocaine (LIDODERM) 5 % Place 1 patch onto  the skin daily. Remove & Discard patch within 12 hours or as directed by MD 30 patch 0   meloxicam (MOBIC) 15 MG tablet TAKE 1 TABLET (15 MG TOTAL) BY MOUTH DAILY. 30 tablet 0   Multiple Vitamins-Minerals (MULTIVITAMIN WITH MINERALS) tablet Take 1 tablet by mouth every other day.      Semaglutide, 2 MG/DOSE, (OZEMPIC, 2 MG/DOSE,) 8 MG/3ML SOPN Inject 2 mg into the skin once a week. 3 mL 2   Vitamin D, Ergocalciferol, (DRISDOL) 1.25 MG (50000 UNIT) CAPS capsule Take 1 capsule (50,000 Units total) by mouth every 7 (seven) days. 12 capsule 3   No current facility-administered medications on file prior to visit.     The following portions of the patient's history were reviewed and updated as appropriate: allergies, current medications, past family history, past medical history, past social history, past surgical history and problem list.  ROS Otherwise as in subjective  above    Objective: BP 120/78   Pulse 72   Temp 98.2 F (36.8 C)   General appearance: alert, no distress, well developed, well nourished Skin: faint macular flesh colored rash around orbit left and right lateral orbit, no obvious erythema, no obvious vesicles or pustules HEENT: normocephalic, sclerae anicteric, conjunctiva pink and moist, TMs pearly, nares patent, no discharge or erythema, pharynx normal Oral cavity: MMM, no lesions Neck: supple, no lymphadenopathy, no thyromegaly, no masses    Assessment: Encounter Diagnosis  Name Primary?   Rash Yes     Plan: I reviewed back over her recent visit here with Sarabeth.  Apparently she never got vesicles or obvious shingles rash but what ever symptoms she had on the right have cleared up thankfully.  She did end up using the Valtrex and acyclovir cream topically  At this point her symptoms have mostly resolved and set up for a irritated skin rash.  I recommend she continue her Aloveita lotion which seems to be working okay.  Discontinue acyclovir cream.     Akyla was seen today for follow-up.  Diagnoses and all orders for this visit:  Rash   Follow up: prn

## 2024-02-29 ENCOUNTER — Telehealth: Payer: Self-pay

## 2024-02-29 NOTE — Progress Notes (Signed)
   02/29/2024  Patient ID: Bailey [name], female   DOB: Dec 10, 1956, 68 y.o.   MRN: [id number]  Contacted patient via telephone to follow up on Dexcom Sensors and use of app.  Patient reports she was successfully able to pair new sensor to her app last night and has no concerns.  Will follow back up around 1 month   Sherrill Raring, PharmD Clinical Pharmacist [phone]

## 2024-03-05 ENCOUNTER — Other Ambulatory Visit: Payer: Self-pay | Admitting: Medical

## 2024-03-08 ENCOUNTER — Telehealth: Payer: Self-pay | Admitting: *Deleted

## 2024-03-08 NOTE — Telephone Encounter (Signed)
..  Patient was identified as falling into the [place] Measure - Diabetes.   Patient was: Appointment scheduled with primary care provider in the next 30 days.   Patient scheduled for 04/23/24.

## 2024-03-11 ENCOUNTER — Other Ambulatory Visit: Payer: Self-pay | Admitting: Medical

## 2024-03-11 NOTE — Telephone Encounter (Signed)
 Copied from CRM [id number]. Topic: Clinical - Medication Refill >> Mar 11, 2024  3:03 PM Fuller Mandril wrote: Most Recent Primary Care Visit:  Provider: Jac Canavan  Department: [facility]  Visit Type: FOLLOW UP 15  Date: 02/22/2024  Medication: Semaglutide, 2 MG/DOSE, (OZEMPIC, 2 MG/DOSE,) 8 MG/3ML SOPN Gold Box   Has the patient contacted their pharmacy? Yes (Agent: If no, request that the patient contact the pharmacy for the refill. If patient does not wish to contact the pharmacy document the reason why and proceed with request.) (Agent: If yes, when and what did the pharmacy advise?) Get with provider   Is this the correct pharmacy for this prescription? Yes If no, delete pharmacy and type the correct one.  This is the patient's preferred pharmacy:  [id number] - [place], [place] - [address] AT [facility] [address] [place] [address] Phone: [phone] Fax: [phone]   Has the prescription been filled recently? No  Is the patient out of the medication? Yes - dose due this week   Has the patient been seen for an appointment in the last year OR does the patient have an upcoming appointment? Yes  Can we respond through MyChart? Yes  Agent: Please be advised that Rx refills may take up to 3 business days. We ask that you follow-up with your pharmacy.

## 2024-03-25 ENCOUNTER — Other Ambulatory Visit: Payer: Self-pay | Admitting: Medical

## 2024-04-02 ENCOUNTER — Other Ambulatory Visit: Payer: Self-pay | Admitting: Medical

## 2024-04-16 ENCOUNTER — Other Ambulatory Visit: Payer: Self-pay | Admitting: Medical

## 2024-04-23 ENCOUNTER — Other Ambulatory Visit: Payer: Self-pay | Admitting: Medical

## 2024-04-23 ENCOUNTER — Ambulatory Visit (INDEPENDENT_AMBULATORY_CARE_PROVIDER_SITE_OTHER): Payer: 59 | Admitting: Medical

## 2024-04-23 ENCOUNTER — Encounter: Payer: Self-pay | Admitting: Medical

## 2024-04-23 VITALS — BP 122/70 | HR 92 | Ht 61.0 in | Wt 131.0 lb

## 2024-04-23 DIAGNOSIS — F17200 Nicotine dependence, unspecified, uncomplicated: Secondary | ICD-10-CM | POA: Diagnosis not present

## 2024-04-23 DIAGNOSIS — E782 Mixed hyperlipidemia: Secondary | ICD-10-CM | POA: Diagnosis not present

## 2024-04-23 DIAGNOSIS — Z90721 Acquired absence of ovaries, unilateral: Secondary | ICD-10-CM

## 2024-04-23 DIAGNOSIS — G479 Sleep disorder, unspecified: Secondary | ICD-10-CM | POA: Diagnosis not present

## 2024-04-23 DIAGNOSIS — Z90710 Acquired absence of both cervix and uterus: Secondary | ICD-10-CM

## 2024-04-23 DIAGNOSIS — M8580 Other specified disorders of bone density and structure, unspecified site: Secondary | ICD-10-CM | POA: Diagnosis not present

## 2024-04-23 DIAGNOSIS — Z79899 Other long term (current) drug therapy: Secondary | ICD-10-CM

## 2024-04-23 DIAGNOSIS — E1165 Type 2 diabetes mellitus with hyperglycemia: Secondary | ICD-10-CM | POA: Diagnosis not present

## 2024-04-23 DIAGNOSIS — N951 Menopausal and female climacteric states: Secondary | ICD-10-CM

## 2024-04-23 DIAGNOSIS — E1169 Type 2 diabetes mellitus with other specified complication: Secondary | ICD-10-CM

## 2024-04-23 MED ORDER — VENLAFAXINE HCL 25 MG PO TABS: 25.0000 mg | ORAL_TABLET | Freq: Two times a day (BID) | ORAL | 0 refills | Status: DC

## 2024-04-23 MED ORDER — OZEMPIC (2 MG/DOSE) 8 MG/3ML ~~LOC~~ SOPN: 2.0000 mg | PEN_INJECTOR | SUBCUTANEOUS | 2 refills | Status: DC

## 2024-04-23 NOTE — Progress Notes (Signed)
 Subjective:  Bailey [name] is a 68 y.o. female who presents for Chief Complaint  Patient presents with   Diabetes    2 mo med check. Sugars are up and down, brought list with. Having hot flashes, waking up at night.      Here for medication management  Diabetes-compliant with Jardiance  10 mg daily, Ozempic  2 mg weekly.  She uses her Lantus  30 units most days but there are some days she does not take it at all if her blood sugars are running lower than average.  Fasting blood sugars at home for the last 2 months have ranged anywhere from 70 up to 150 but the averages are around 125 given her readings.  No foot lesions, no polyuria or polydipsia.  No blurred vision.  She was having cramping last visit but that has settled down.  She does do the mustard 1 teaspoon a day that we discussed  Hyperlipidemia-compliant with atorvastatin  Lipitor 40 mg daily  She is nonfasting today  Her main concern is menopausal symptoms and sleep.  She gets hot flashes quite regularly throughout the day and night.  She does not sleep well compared to in the past.  She wants something to help with sleep and menopausal symptoms but she is worried about side effects  No fever, no night sweats, no recent illness.  She has tried black cohosh over-the-counter and that did not help.  She also knows that medicine can be quite risky with her.  Benadryl makes her feel hung over the next day.  She does not want real strong medication.  Continues to smoke  No other aggravating or relieving factors.    No other c/o.   Past Medical History:  Diagnosis Date   Arthritis    generalized   Cataract    LEFT eye   Cervical radiculopathy at C5    Dupuytren's contracture of both hands    L > R   Dyslipidemia    History of adenomatous polyp of colon    1997 and 2003     History of chronic pelvic inflammatory disease    History of gastric ulcer    2008   Hypertriglyceridemia    on meds   Osteoporosis     Right carpal tunnel syndrome    Seasonal allergies    Type 2 diabetes mellitus, uncontrolled    last A1c 8.9 on 07-07-2016   Vitamin D  deficiency    Current Outpatient Medications on File Prior to Visit  Medication Sig Dispense Refill   atorvastatin  (LIPITOR) 40 MG tablet Take 1 tablet (40 mg total) by mouth daily. 90 tablet 3   CALCIUM  PO Take 600 mg by mouth daily.     empagliflozin  (JARDIANCE ) 10 MG TABS tablet Take 1 tablet (10 mg total) by mouth daily. 90 tablet 3   glucose blood test strip 2 (two) times daily.     Insulin  Pen Needle (BD PEN NEEDLE NANO U/F) 32G X 4 MM MISC Use once a day with insulin  pen 100 each 6   LANTUS  SOLOSTAR 100 UNIT/ML Solostar Pen INJECT 35 UNITS INTO THE SKIN AT BEDTIME. (Patient taking differently: Inject 30 Units into the skin at bedtime.) 31.5 mL 0   Multiple Vitamins-Minerals (MULTIVITAMIN WITH MINERALS) tablet Take 1 tablet by mouth every other day.      Semaglutide , 2 MG/DOSE, (OZEMPIC , 2 MG/DOSE,) 8 MG/3ML SOPN Inject 2 mg into the skin once a week. 3 mL 2   Vitamin D , Ergocalciferol , (DRISDOL ) 1.25  MG (50000 UNIT) CAPS capsule Take 1 capsule (50,000 Units total) by mouth every 7 (seven) days. 12 capsule 3   acyclovir  cream (ZOVIRAX ) 5 % Apply 1 Application topically every 4 (four) hours. May use for up to 7 days (Patient not taking: Reported on 04/23/2024) 15 g 2   ciclopirox  (PENLAC ) 8 % solution Apply topically at bedtime. Apply over nail and surrounding skin. Apply daily over previous coat. After seven (7) days, may remove with alcohol and continue cycle. (Patient not taking: Reported on 04/23/2024) 6.6 mL 5   Continuous Blood Gluc Receiver (DEXCOM G7 RECEIVER) DEVI UAD (Patient not taking: Reported on 04/23/2024) 1 each 0   Continuous Glucose Sensor (DEXCOM G7 SENSOR) MISC USE TO CHECK BLOOD SUGAR THREE TIMES DAILY. CHANGE SENSORS ONCE EVERY 10 DAYS. (Patient not taking: Reported on 04/23/2024) 3 each 3   gabapentin  (NEURONTIN ) 300 MG capsule Take 1  capsule (300 mg total) by mouth at bedtime as needed. TAKE 1 CAPSULE(300 MG) BY MOUTH AT BEDTIME (Patient not taking: Reported on 04/23/2024) 90 capsule 3   Glucagon , rDNA, (GLUCAGON  EMERGENCY) 1 MG KIT Inj 1 mg IM as needed for significant low blood sugar episode (Patient not taking: Reported on 04/23/2024) 1 kit 0   lidocaine  (LIDODERM ) 5 % Place 1 patch onto the skin daily. Remove & Discard patch within 12 hours or as directed by MD (Patient not taking: Reported on 04/23/2024) 30 patch 0   meloxicam  (MOBIC ) 15 MG tablet TAKE 1 TABLET (15 MG TOTAL) BY MOUTH DAILY. (Patient not taking: Reported on 04/23/2024) 30 tablet 0   No current facility-administered medications on file prior to visit.    The following portions of the patient's history were reviewed and updated as appropriate: allergies, current medications, past family history, past medical history, past social history, past surgical history and problem list.  ROS Otherwise as in subjective above   Objective: BP 122/70   Pulse 92   Ht 5\' 1"  (1.549 m)   Wt 131 lb (59.4 kg)   SpO2 99%   BMI 24.75 kg/m   General appearance: alert, no distress, well developed, well nourished Neck: supple, no lymphadenopathy, no thyromegaly, no masses Heart: RRR, normal S1, S2, no murmurs Lungs: CTA bilaterally, no wheezes, rhonchi, or rales Pulses: 1+ radial pulses, anatomically her radial pulses are on the lateral wrist and not in the typical position, 1+ pedal pulses, normal cap refill Ext: no edema  Diabetic Foot Exam - Simple   Simple Foot Form Diabetic Foot exam was performed with the following findings: Yes 04/23/2024  9:31 AM  Visual Inspection See comments: Yes Sensation Testing Intact to touch and monofilament testing bilaterally: Yes Pulse Check See comments: Yes Comments 1+ pedal pulses, relatively flat feet, mildly hypertrophic toenails, no other worrisome findings       Assessment: Encounter Diagnoses  Name Primary?    Medication management Yes   Uncontrolled type 2 diabetes mellitus with hyperglycemia (HCC)    Dyslipidemia with low high density lipoprotein (HDL) cholesterol with hypertriglyceridemia due to type 2 diabetes mellitus (HCC)    S/P hysterectomy with oophorectomy    Menopausal symptoms    Sleep disturbance    Osteopenia, unspecified location    Tobacco dependence      Plan: Diabetes Return tomorrow for fasting labs Continue Jardiance  10 mg daily in the morning Continue Ozempic  injection 2 mg weekly Lets change your Lantus  from 30 units daily down to 15 units daily as you should be taking this every day.  If your blood sugars start averaging more than 130 in the morning you can go up on your Lantus  3 units/week until your blood sugars are averaging between 80-130 fasting in the morning Check your feet daily for sores or wounds See your eye doctor yearly for diabetic eye exam and make sure they send us  a copy of your notes  Dyslipidemia/high cholesterol Return tomorrow for fasting labs Continue atorvastatin  Lipitor 40 mg daily  Menopausal symptoms, Sleep disturbance Lets begin trial of effexor/venlafaxine.  We will start low dose 25mg  twice daily.  Some potential side effects include weird moods, depression, headache, nausea.  Most people do ok on this.  Lets recheck in 3-4 weeks.  Osteopenia, low bone mass I recommend you get aerobic exercise such as walking or other regular exercise 40 to 60 minutes most days per week I recommend you do weightbearing exercise at least 2 days/week  Please call to schedule your bone density test for June.   [facility]  [phone] [address], [address] [place], [place] [address]   Tobacco dependence-consider efforts to quit tobacco through medication and counseling.  There are medicines such as nicotine  patches or nicotine  gum or other potential medications we can consider   Vaccine counseling I do not have a  copy of your second shingles vaccine.  If you never got a second shingles vaccine and I recommend you get this at your pharmacy and make sure they send us  a copy of the documentation   You had ABI blood flow screening October 2024 that was normal peripherally.  This is a vascular disease screening recommended for diabetics   Skylinn was seen today for diabetes.  Diagnoses and all orders for this visit:  Medication management  Uncontrolled type 2 diabetes mellitus with hyperglycemia (HCC) -     Comprehensive metabolic panel with GFR; Future -     Hemoglobin A1c; Future  Dyslipidemia with low high density lipoprotein (HDL) cholesterol with hypertriglyceridemia due to type 2 diabetes mellitus (HCC) -     Comprehensive metabolic panel with GFR; Future -     Lipid panel; Future  S/P hysterectomy with oophorectomy  Menopausal symptoms  Sleep disturbance  Osteopenia, unspecified location -     DG Bone Density; Future  Tobacco dependence  Spent > 45 minutes face to face with patient in discussion of symptoms, evaluation, plan and recommendations.    Follow up: tomorrow for labs fasting

## 2024-04-23 NOTE — Patient Instructions (Signed)
 Diabetes Return tomorrow for fasting labs Continue Jardiance  10 mg daily in the morning Continue Ozempic  injection 2 mg weekly Lets change your Lantus  from 30 units daily down to 15 units daily as you should be taking this every day.  If your blood sugars start averaging more than 130 in the morning you can go up on your Lantus  3 units/week until your blood sugars are averaging between 80-130 fasting in the morning Check your feet daily for sores or wounds See your eye doctor yearly for diabetic eye exam and make sure they send us  a copy of your notes  Dyslipidemia/high cholesterol Return tomorrow for fasting labs Continue atorvastatin  Lipitor 40 mg daily  Menopausal symptoms, Sleep disturbance Lets begin trial of effexor/venlafaxine.  We will start low dose 25mg  twice daily.  Some potential side effects include weird moods, depression, headache, nausea.  Most people do ok on this.  Lets recheck in 3-4 weeks.  Osteopenia, low bone mass I recommend you get aerobic exercise such as walking or other regular exercise 40 to 60 minutes most days per week I recommend you do weightbearing exercise at least 2 days/week  Please call to schedule your bone density test for June.   [facility]  [phone], [address] [place], [place] [address]   Tobacco dependence-consider efforts to quit tobacco through medication and counseling.  There are medicines such as nicotine  patches or nicotine  gum or other potential medications we can consider   Vaccine counseling I do not have a copy of your second shingles vaccine.  If you never got a second shingles vaccine and I recommend you get this at your pharmacy and make sure they send us  a copy of the documentation   You had ABI blood flow screening October 2024 that was normal peripherally.  This is a vascular disease screening recommended for diabetics

## 2024-04-24 ENCOUNTER — Other Ambulatory Visit: Payer: Self-pay | Admitting: Medical

## 2024-04-24 ENCOUNTER — Ambulatory Visit (INDEPENDENT_AMBULATORY_CARE_PROVIDER_SITE_OTHER): Admitting: Medical

## 2024-04-24 DIAGNOSIS — E1165 Type 2 diabetes mellitus with hyperglycemia: Secondary | ICD-10-CM | POA: Diagnosis not present

## 2024-04-24 DIAGNOSIS — R7989 Other specified abnormal findings of blood chemistry: Secondary | ICD-10-CM | POA: Diagnosis not present

## 2024-04-24 DIAGNOSIS — E1169 Type 2 diabetes mellitus with other specified complication: Secondary | ICD-10-CM | POA: Diagnosis not present

## 2024-04-24 DIAGNOSIS — E782 Mixed hyperlipidemia: Secondary | ICD-10-CM

## 2024-04-24 DIAGNOSIS — R748 Abnormal levels of other serum enzymes: Secondary | ICD-10-CM | POA: Diagnosis not present

## 2024-04-24 LAB — LIPID PANEL

## 2024-04-25 ENCOUNTER — Other Ambulatory Visit: Payer: Self-pay | Admitting: Medical

## 2024-04-25 ENCOUNTER — Telehealth: Payer: Self-pay | Admitting: Internal Medicine

## 2024-04-25 ENCOUNTER — Encounter: Payer: Self-pay | Admitting: Internal Medicine

## 2024-04-25 LAB — COMPREHENSIVE METABOLIC PANEL WITH GFR
ALT: 38 IU/L — ABNORMAL HIGH (ref 0–32)
AST: 28 IU/L (ref 0–40)
Albumin: 4.1 g/dL (ref 3.9–4.9)
Alkaline Phosphatase: 84 IU/L (ref 44–121)
BUN/Creatinine Ratio: 18 (ref 12–28)
BUN: 13 mg/dL (ref 8–27)
Bilirubin Total: 0.3 mg/dL (ref 0.0–1.2)
CO2: 20 mmol/L (ref 20–29)
Calcium: 8.6 mg/dL — ABNORMAL LOW (ref 8.7–10.3)
Chloride: 109 mmol/L — ABNORMAL HIGH (ref 96–106)
Creatinine, Ser: 0.72 mg/dL (ref 0.57–1.00)
Globulin, Total: 2 g/dL (ref 1.5–4.5)
Glucose: 126 mg/dL — ABNORMAL HIGH (ref 70–99)
Potassium: 3.7 mmol/L (ref 3.5–5.2)
Sodium: 142 mmol/L (ref 134–144)
Total Protein: 6.1 g/dL (ref 6.0–8.5)
eGFR: 90 mL/min/{1.73_m2} (ref >59)

## 2024-04-25 LAB — HEMOGLOBIN A1C
Est. average glucose Bld gHb Est-mCnc: 209 mg/dL
Hgb A1c MFr Bld: 8.9 % — ABNORMAL HIGH (ref 4.8–5.6)

## 2024-04-25 LAB — LIPID PANEL
Cholesterol, Total: 75 mg/dL — ABNORMAL LOW (ref 100–199)
HDL: 33 mg/dL — ABNORMAL LOW (ref >39)
LDL CALC COMMENT:: 2.3 ratio (ref 0.0–4.4)
LDL Chol Calc (NIH): 26 mg/dL (ref 0–99)
Triglycerides: 72 mg/dL (ref 0–149)
VLDL Cholesterol Cal: 16 mg/dL (ref 5–40)

## 2024-04-25 NOTE — Telephone Encounter (Signed)
-----   Message from Lovett Ruck sent at 04/25/2024  7:45 AM EDT ----- See other message from today.  Disregard the freestyle Jerrilyn Moras remark as she is already on Dexcom.  Sarahn how can we get a Dexcom report for the last month?

## 2024-04-25 NOTE — Telephone Encounter (Signed)
 Patient is not set up in the dexcome portal so until she comes in for her to give us  access we won't be able too

## 2024-04-25 NOTE — Progress Notes (Signed)
 See if lab can add Hep C antibody, hepatitis B surface antigen and iron level to screen for elevated liver test  Lab results: Diabetes marker still not quite to goal at 8.9%.  Needs to be less than 7%.  Cholesterol okay.  Electrolytes okay, kidney marker okay.  Liver test still slightly elevated.  I want to add a few labs to screen for elevated liver test.  I would recommend a baseline ultrasound of the liver to further evaluate your elevated liver test.  Continue Jardiance , continue Ozempic  injection.  Change the Lantus  to 15 daily and take it every single day.  Every week go to 3 units until your sugars are down close to 130 or less  Begin the Effexor  venlafaxine  to help with menopausal symptoms  If you are not using the freestyle libre device, I would like to see if we can do this for you as this would be a better way to keep your sugars.  With all the medication you are on your sugars should be looking better.  Lets follow-up in about 6 weeks

## 2024-04-26 NOTE — Progress Notes (Signed)
 Negative for hepatitis C, negative hepatitis B, iron level normal.  I recommend moving forward with a liver ultrasound.  If agreeable we will order an abdominal ultrasound to look at the liver

## 2024-05-01 LAB — IRON: Iron: 93 ug/dL (ref 27–139)

## 2024-05-01 LAB — HEPATITIS B SURFACE ANTIGEN: Hepatitis B Surface Ag: NEGATIVE

## 2024-05-01 LAB — HEPATITIS C ANTIBODY: Hep C Virus Ab: NONREACTIVE

## 2024-05-01 LAB — SPECIMEN STATUS REPORT

## 2024-05-02 ENCOUNTER — Ambulatory Visit: Payer: Self-pay

## 2024-05-02 NOTE — Telephone Encounter (Signed)
 Chief Complaint: right shoulder pain Symptoms: right shoulder pain Frequency: x 2 days Pertinent Negatives: Patient denies break in skin Disposition: [] ED /[x] Urgent Care (no appt availability in office) / [x] Appointment(In office/virtual)/ []  [facility]/ [] Home Care/ [] Refused Recommended Disposition /[] [facility]/ [x]  Follow-up with PCP Additional Notes: pt states that Tuesday at work a box fell and hit her shoulder and it has been causing her some pain. States that she had rotator cuff surgery on that shoulder before and just wanted to make sure shoulder was ok. States she has been taking tylenol .   Copied from CRM [id number]. Topic: Clinical - Red Word Triage >> May 02, 2024  9:33 AM Oddis Bench wrote: Red Word that prompted transfer to Nurse Triage: Patient is calling she had a box to hit her right shoulder at work and it is causing her pain. Reason for Disposition  [1] After 3 days AND [2] pain not improving  Answer Assessment - Initial Assessment Questions 1. MECHANISM: "How did the injury happen?"     Box fell on shoulder 2. ONSET: "When did the injury happen?" (Minutes or hours ago)      tuesday 3. APPEARANCE of INJURY: "What does the injury look like?"      None 4. SEVERITY: "Can you move the shoulder normally?"      yes 5. SIZE: For cuts, bruises, or swelling, ask: "How large is it?" (e.g., inches or centimeters;  entire joint)      no 6. PAIN: "Is there pain?" If Yes, ask: "How bad is the pain?"   (e.g., Scale 1-10; or mild, moderate, severe)   - MILD (1-3): doesn't interfere with normal activities   - MODERATE (4-7): interferes with normal activities (e.g., work or school) or awakens from sleep   - SEVERE (8-10): excruciating pain, unable to do any normal activities, unable to move arm at all due to pain     7/10  8. OTHER SYMPTOMS: "Do you have any other symptoms?" (e.g., loss of sensation)     no  Protocols used: Shoulder Injury-A-AH

## 2024-05-02 NOTE — Telephone Encounter (Signed)
 This would be a worker's comp issues. She will need to contact her manager about steps she needs to take for worker's comp.

## 2024-05-03 ENCOUNTER — Other Ambulatory Visit: Payer: Self-pay | Admitting: Medical

## 2024-05-03 NOTE — Telephone Encounter (Signed)
 Discontinued by Jimmye Moulds on 4/29 at appt

## 2024-05-07 ENCOUNTER — Ambulatory Visit: Admitting: Medical

## 2024-05-07 VITALS — BP 120/70 | HR 66 | Temp 96.5°F | Wt 131.6 lb

## 2024-05-07 DIAGNOSIS — S40011A Contusion of right shoulder, initial encounter: Secondary | ICD-10-CM

## 2024-05-07 DIAGNOSIS — R0989 Other specified symptoms and signs involving the circulatory and respiratory systems: Secondary | ICD-10-CM | POA: Diagnosis not present

## 2024-05-07 NOTE — Progress Notes (Signed)
 Subjective:  Bailey [name] is a 68 y.o. female who presents for Chief Complaint  Patient presents with   Shoulder Injury    Right Shoulder pain, hit with a box last Tuesday. Treating lce and heat and taking tylenol       Here for right shoulder pain.  Injury occurred a week ago on Tuesday.  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  A box that she thinks weighed about 10 or 15 pounds fell on her right shoulder.  She had some pain immediately of the right shoulder.  She was concerned because she has had prior surgery with that shoulder and was worried if it had caused any problems with the prior surgery.  She initially tried to go to multiple people at work to ask where  she should go for evaluation but all of the people she normally reports to was on vacation or out of the office.  Thus she made the appointment with us .  The pain is about the same today as it was last week.  She has been using some Tylenol , ice and heat.  She has some pain with overhead motion.  But range of motion is fairly good.  No numbness, tingling.  No fever.  No break in the skin.  No neck pain.  No head injury.  Otherwise normal state of health.  She has questions about her veins in her legs in general.  They look more prominent than they used to but no pain or swelling.  No other aggravating or relieving factors.    No other c/o.   The following portions of the patient's history were reviewed and updated as appropriate: allergies, current medications, past family history, past medical history, past social history, past surgical history and problem list.  ROS Otherwise as in subjective above  Objective: BP 120/70   Pulse 66   Temp (!) 96.5 F (35.8 C)   Wt 131 lb 9.6 oz (59.7 kg)   SpO2 97%   BMI 24.87 kg/m   General appearance: alert, no distress, well developed, well nourished Neck: Nontender, normal range of motion, supple, no mass, no lymphadenopathy MSK: Mild tenderness  superiorly of the right shoulder around the [facility] joint and superior portion of the deltoid but otherwise nontender.  No other tenderness of the shoulder or arm.  There is slight decrease in range of motion with external and internal rotation.  Mild pain with some overhead motion in the superior portion of the deltoid.  No swelling.  Rest of arm unremarkable. Arm is neurovascularly intact Back nontender, chest wall nontender, no deformity No swelling or edema of the extremities There is some superficial prominent veins in both legs and some mild lateral varicose veins of right lower leg but no spider veins, no tenderness, no swelling 1+ pedal pulses      Assessment: Encounter Diagnoses  Name Primary?   Contusion of right shoulder, initial encounter Yes   Decreased pedal pulses      Plan: Bruise or contusion of right shoulder For the next 4 to 5 days you can use cold therapy such as ice water or cold pack on your right shoulder for 20 minutes twice daily or 3 times a day.  Use a cloth between the cold therapy and your shoulder. Use relative rest or limit activity with the right arm when possible over the next 5 days Consider using an arm sling periodically when you have a chance to rest the arm  over the next 5 days Do stretch and use range of motion exercises throughout the day You can use over-the-counter Tylenol  either 325 mg or 500 mg twice daily for pain for the next few days Symptoms should gradually resolve over the next week. If symptoms are actually worse in the next week then get reevaluated   Very mild varicose veins of right leg Consider wearing compression hose or compression socks regularly Continue with routine exercise like you are doing Of note you had an ABI blood flow screen October 2024 that was normal Be reassured that your legs do not have any major blood flow issues    Nihal was seen today for shoulder injury.  Diagnoses and all orders for this  visit:  Contusion of right shoulder, initial encounter  Decreased pedal pulses    Follow up: prn

## 2024-05-07 NOTE — Patient Instructions (Signed)
 Bruise or contusion of right shoulder For the next 4 to 5 days you can use cold therapy such as ice water or cold pack on your right shoulder for 20 minutes twice daily or 3 times a day.  Use a cloth between the cold therapy and your shoulder. Use relative rest or limit activity with the right arm when possible over the next 5 days Consider using an arm sling periodically when you have a chance to rest the arm over the next 5 days Do stretch and use range of motion exercises throughout the day You can use over-the-counter Tylenol  either 325 mg or 500 mg twice daily for pain for the next few days Symptoms should gradually resolve over the next week. If symptoms are actually worse in the next week then get reevaluated   Very mild varicose veins of right leg Consider wearing compression hose or compression socks regularly Continue with routine exercise like you are doing Of note you had an ABI blood flow screen October 2024 that was normal Be reassured that your legs do not have any major blood flow issues

## 2024-06-03 ENCOUNTER — Encounter: Payer: Self-pay | Admitting: Medical

## 2024-06-03 ENCOUNTER — Ambulatory Visit: Admitting: Medical

## 2024-06-03 VITALS — BP 112/70 | HR 69 | Ht 61.0 in | Wt 134.2 lb

## 2024-06-03 DIAGNOSIS — M25511 Pain in right shoulder: Secondary | ICD-10-CM | POA: Diagnosis not present

## 2024-06-03 DIAGNOSIS — S4991XD Unspecified injury of right shoulder and upper arm, subsequent encounter: Secondary | ICD-10-CM | POA: Diagnosis not present

## 2024-06-03 DIAGNOSIS — M549 Dorsalgia, unspecified: Secondary | ICD-10-CM

## 2024-06-03 NOTE — Progress Notes (Signed)
 Subjective:  Bailey [name] is a 68 y.o. female who presents for Chief Complaint  Patient presents with   Shoulder Pain    Right shoulder pain X 1 mth, Not getting better. She had rotator cuff surgery years ago. Has been using the lidocaine  patch. No heavy lifting on right arm.      Here for recheck on right shoulder pain and injury.  I saw her for this 05/07/2024.  Since last visit she has been doing stretching, using arm sling some, cold therapy, oral Tylenol  and lidocaine  patch topically.  She sees some improvement but certainly not back to normal.  Shoulder still achy and in upper back still achy.  She is avoiding heavy lifting and heavy motion as it seems aggravate the pain.  History from last visit, she injured herself about a week before her visit on 05/07/2024.  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  A box that she thinks weighed about 10 or 15 pounds fell on her right shoulder.  She had some pain immediately of the right shoulder.  She was concerned because she has had prior surgery with that shoulder and was worried if it had caused any problems with the prior surgery.  At her last visit she was having pain with overhead motion.  No numbness, tingling.  No fever.  No break in the skin.  No neck pain.  No head injury.  Otherwise normal state of health.  She initially tried to go to multiple people at work to ask where  she should go for evaluation but all of the people she normally reports to was on vacation or out of the office.  Thus she made the appointment with us .  No other aggravating or relieving factors.    No other c/o.   The following portions of the patient's history were reviewed and updated as appropriate: allergies, current medications, past family history, past medical history, past social history, past surgical history and problem list.  ROS Otherwise as in subjective above  Objective: BP 112/70   Pulse 69   Ht 5\' 1"  (1.549 m)   Wt 134  lb 3.2 oz (60.9 kg)   SpO2 98%   BMI 25.36 kg/m   General appearance: alert, no distress, well developed, well nourished Neck: Nontender, normal range of motion, supple, no mass, no lymphadenopathy MSK: Tender over the right deltoid, right AC joint, otherwise shoulder normal and nontender.  Right side internal and external range of motion reduced.  She has about 85% range of motion today.  Pain with crossover test, mild pain with apprehension test. She seems to have some mild weakness with rotator cuff exercises and shoulder flexion.  Otherwise arms neurovascularly intact. Back tender in the right upper back paraspinal and rhomboid region, otherwise back nontender, chest wall nontender, no deformity No swelling or edema of the extremities     Assessment: Encounter Diagnoses  Name Primary?   Right shoulder pain, unspecified chronicity Yes   Injury of right shoulder, subsequent encounter    Upper back pain on right side       Plan: At this point not under percent improved.  I recommend physical therapy referral or referral to orthopedics.  She will talk with her employer first as she needs to let them know disposition from today.  Continue to use relative rest, no heavy lifting currently with the right arm, no lifting over 10 pounds with the right arm.  Continue arm sling periodically  as needed.  You can continue to use Tylenol  for pain as you have been doing.  We discussed arm stretches and range of motion activity.  Go as recommended for physical therapy or orthopedics consult  Ryelle was seen today for shoulder pain.  Diagnoses and all orders for this visit:  Right shoulder pain, unspecified chronicity  Injury of right shoulder, subsequent encounter  Upper back pain on right side     Follow up: pending referral

## 2024-06-06 ENCOUNTER — Telehealth: Payer: Self-pay

## 2024-06-06 ENCOUNTER — Ambulatory Visit: Payer: Self-pay | Admitting: *Deleted

## 2024-06-06 ENCOUNTER — Other Ambulatory Visit: Payer: Self-pay

## 2024-06-06 ENCOUNTER — Emergency Department (HOSPITAL_COMMUNITY)
Admission: EM | Admit: 2024-06-06 | Discharge: 2024-06-06 | Disposition: A | Discharge status: Home / Self Care | Attending: Emergency Medicine | Admitting: Emergency Medicine

## 2024-06-06 DIAGNOSIS — R739 Hyperglycemia, unspecified: Secondary | ICD-10-CM

## 2024-06-06 DIAGNOSIS — Z7984 Long term (current) use of oral hypoglycemic drugs: Secondary | ICD-10-CM | POA: Insufficient documentation

## 2024-06-06 DIAGNOSIS — Z91040 Latex allergy status: Secondary | ICD-10-CM | POA: Diagnosis not present

## 2024-06-06 DIAGNOSIS — E1165 Type 2 diabetes mellitus with hyperglycemia: Secondary | ICD-10-CM | POA: Insufficient documentation

## 2024-06-06 DIAGNOSIS — Z794 Long term (current) use of insulin: Secondary | ICD-10-CM | POA: Diagnosis not present

## 2024-06-06 LAB — URINALYSIS, ROUTINE W REFLEX MICROSCOPIC
Bacteria, UA: NONE SEEN
Bilirubin Urine: NEGATIVE
Glucose, UA: >=500 mg/dL — AB
Hgb urine dipstick: NEGATIVE
Ketones, ur: NEGATIVE mg/dL
Leukocytes,Ua: NEGATIVE
Nitrite: NEGATIVE
Protein, ur: NEGATIVE mg/dL
Specific Gravity, Urine: 1.023 (ref 1.005–1.030)
pH: 5 (ref 5.0–8.0)

## 2024-06-06 LAB — CBG MONITORING, ED: Glucose-Capillary: 253 mg/dL — ABNORMAL HIGH (ref 70–99)

## 2024-06-06 LAB — CBC WITH DIFFERENTIAL/PLATELET
Abs Immature Granulocytes: 0.03 10*3/uL (ref 0.00–0.07)
Basophils Absolute: 0 10*3/uL (ref 0.0–0.1)
Basophils Relative: 0 %
Eosinophils Absolute: 0.1 10*3/uL (ref 0.0–0.5)
Eosinophils Relative: 2 %
HCT: 43.2 % (ref 36.0–46.0)
Hemoglobin: 14.6 g/dL (ref 12.0–15.0)
Immature Granulocytes: 0 %
Lymphocytes Relative: 29 %
Lymphs Abs: 2.7 10*3/uL (ref 0.7–4.0)
MCH: 32.9 pg (ref 26.0–34.0)
MCHC: 33.8 g/dL (ref 30.0–36.0)
MCV: 97.3 fL (ref 80.0–100.0)
Monocytes Absolute: 0.6 10*3/uL (ref 0.1–1.0)
Monocytes Relative: 7 %
Neutro Abs: 5.7 10*3/uL (ref 1.7–7.7)
Neutrophils Relative %: 62 %
Platelets: 142 10*3/uL — ABNORMAL LOW (ref 150–400)
RBC: 4.44 MIL/uL (ref 3.87–5.11)
RDW: 14.4 % (ref 11.5–15.5)
WBC: 9.2 10*3/uL (ref 4.0–10.5)
nRBC: 0 % (ref 0.0–0.2)

## 2024-06-06 LAB — COMPREHENSIVE METABOLIC PANEL WITH GFR
ALT: 45 U/L — ABNORMAL HIGH (ref 0–44)
AST: 30 U/L (ref 15–41)
Albumin: 3.9 g/dL (ref 3.5–5.0)
Alkaline Phosphatase: 74 U/L (ref 38–126)
Anion gap: 10 (ref 5–15)
BUN: 17 mg/dL (ref 8–23)
CO2: 24 mmol/L (ref 22–32)
Calcium: 9.2 mg/dL (ref 8.9–10.3)
Chloride: 105 mmol/L (ref 98–111)
Creatinine, Ser: 0.75 mg/dL (ref 0.44–1.00)
GFR, Estimated: >60 mL/min (ref >60)
Glucose, Bld: 333 mg/dL — ABNORMAL HIGH (ref 70–99)
Potassium: 4.1 mmol/L (ref 3.5–5.1)
Sodium: 139 mmol/L (ref 135–145)
Total Bilirubin: 0.8 mg/dL (ref 0.0–1.2)
Total Protein: 7 g/dL (ref 6.5–8.1)

## 2024-06-06 NOTE — Telephone Encounter (Signed)
 Spoke to [place] with [facility] and patient blood sugar was 399 and then went down to 382. Pt is having a headache. She did eat something around 12 noon today but sugar has never been this high before. She takes 35 units at bedtime of Lantus . Patrice Books to have patient go to the ER since it is late in the day and I wasn't sure if something could be addressed today. Patient has increased her water intake to try to get her sugar down.

## 2024-06-06 NOTE — Telephone Encounter (Signed)
 FYI Only or Action Required?: Action required by provider  Patient was last seen in primary care on 06/03/2024 by Claudene Crystal, PA-C. Called Nurse Triage reporting Blood Sugar Problem. Symptoms began today. Interventions attempted: Prescription medications: jardiance  10 mg this am , drinking extra water. Symptoms are: gradually worsening. Blood glucose 399 today , 882, 377 and now 386. Sx of headache  Triage Disposition: Call PCP Now  Patient/caregiver understands and will follow disposition?: Yes                  Copied from CRM [id number]. Topic: Clinical - Red Word Triage >> Jun 06, 2024  3:57 PM Zipporah Him wrote: Red Word that prompted transfer to Nurse Triage: Last night blood sugar was 290+, right now it is at 399. Head hurting is the only symptom. Reason for Disposition  [1] Blood glucose > 300 mg/dL (04.5 mmol/L) AND [4] two or more times in a row  Answer Assessment - Initial Assessment Questions 1. BLOOD GLUCOSE: What is your blood glucose level?      399 prior to call and rechecked for 382 patient drinking more water and dropped to 377 and prior to end of call increased to 386. 2. ONSET: When did you check the blood glucose?     Now  3. USUAL RANGE: What is your glucose level usually? (e.g., usual fasting morning value, usual evening value)     113-179 4. KETONES: Do you check for ketones (urine or blood test strips)? If Yes, ask: What does the test show now?      na 5. TYPE 1 or 2:  Do you know what type of diabetes you have?  (e.g., Type 1, Type 2, Gestational; doesn't know)      See hx  6. INSULIN : Do you take insulin ? What type of insulin (s) do you use? What is the mode of delivery? (syringe, pen; injection or pump)?      Yes lantus  35 units hs 7. DIABETES PILLS: Do you take any pills for your diabetes? If Yes, ask: Have you missed taking any pills recently?     Yes jardiance  10 mg 8. OTHER SYMPTOMS: Do you have any symptoms? (e.g.,  fever, frequent urination, difficulty breathing, dizziness, weakness, vomiting)     Headache  no other sx reported blood sugar 399 prior to call and now 382 after drinking extra water. 9. PREGNANCY: Is there any chance you are pregnant? When was your last menstrual period?     Na   Called PCP office and spoke with Sabrina Nurse, in regards to any medication patient could take for elevated blood sugar. Since blood sugars remain greater than 300 advised patient to go to ED now. Patient reports she will go to [facility]. Pt has someone to take her. See above blood sugar reading during triage call.  Protocols used: Diabetes - High Blood Sugar-A-AH

## 2024-06-06 NOTE — Progress Notes (Signed)
   06/06/2024  Patient ID: Bailey [name], female   DOB: April 14, 1956, 68 y.o.   MRN: [id number]  Received request via parachute portal from [facility] for patient regarding order for dexcom supplies. Spoke with patient and she confirms she is getting sensors fine from local [facility] and prefers to keep order there.  Scheduled follow up call for 2 weeks for TNM DM.  Carnell Christian, PharmD Clinical Pharmacist [phone]

## 2024-06-06 NOTE — ED Triage Notes (Signed)
 Pt states blood sugar was 399 today- PCP told her to come to the ED. Pt also c/o right side headache.

## 2024-06-06 NOTE — ED Provider Triage Note (Signed)
 Emergency Medicine Provider Triage Evaluation Note  Bailey [name] , a 68 y.o. female  was evaluated in triage.  Pt complains of high BS this week.  Normal sugars 100-130's on daily checks.  This week was in 300's.  No changes in diet.  No fevers, sick symptoms.  No excessive urination  Takes only long acting insulin  at night, no missed doses  Review of Systems  Positive: No complaints Negative: Polydipsia, polyuria, fevers, coughing, chills  Physical Exam  BP 131/79 (BP Location: Left Arm)   Pulse 76   Temp 98.2 F (36.8 C) (Oral)   Resp 16   SpO2 99%  Gen:   Awake, no distress   Resp:  Normal effort  MSK:   Moves extremities without difficulty    Medical Decision Making  Medically screening exam initiated at 5:58 PM.  Appropriate orders placed.  Bailey [name] was informed that the remainder of the evaluation will be completed by another provider, this initial triage assessment does not replace that evaluation, and the importance of remaining in the ED until their evaluation is complete.  Hyperglycemia - will check labs, UA Clinically does not appear to be in DKA on initial assessment, well appearing   Bailey Birmingham, MD 06/06/24 1800

## 2024-06-06 NOTE — ED Notes (Signed)
Patient has urine culture in the main lab °

## 2024-06-06 NOTE — ED Provider Notes (Signed)
 [place] EMERGENCY DEPARTMENT AT [facility] Provider Note   CSN: [id number] Arrival date & time: 06/06/24  1656     Patient presents with: Hyperglycemia   Bailey [name] is a 68 y.o. female w/ type 2 diabetes on insulin  at night, presenting to ED with concern for elevated blood sugars.  Her typical sugars are in the 100s and for the past week she has noted sugars around the 300s.  No big changes in her diet.  No recent infection symptoms.  She called her PCPs office who told her to come to the ED   HPI     Prior to Admission medications   Medication Sig Start Date End Date Taking? Authorizing Provider  atorvastatin  (LIPITOR) 40 MG tablet Take 1 tablet (40 mg total) by mouth daily. 09/12/23   Tysinger, Christiane Cowing, PA-C  CALCIUM  PO Take 600 mg by mouth daily.    [provider]  ciclopirox  (PENLAC ) 8 % solution Apply topically at bedtime. Apply over nail and surrounding skin. Apply daily over previous coat. After seven (7) days, may remove with alcohol and continue cycle. 05/31/23   Tysinger, Christiane Cowing, PA-C  Continuous Blood Gluc Receiver (DEXCOM G7 RECEIVER) DEVI UAD 11/08/22   Lawrance Presume, MD  Continuous Glucose Sensor (DEXCOM G7 SENSOR) MISC USE TO CHECK BLOOD SUGAR THREE TIMES DAILY. CHANGE SENSORS ONCE EVERY 10 DAYS. 03/25/24   Tysinger, Christiane Cowing, PA-C  empagliflozin  (JARDIANCE ) 10 MG TABS tablet Take 1 tablet (10 mg total) by mouth daily. 09/12/23   Tysinger, Christiane Cowing, PA-C  gabapentin  (NEURONTIN ) 300 MG capsule Take 1 capsule (300 mg total) by mouth at bedtime as needed. TAKE 1 CAPSULE(300 MG) BY MOUTH AT BEDTIME 09/12/23   Tysinger, Christiane Cowing, PA-C  Glucagon , rDNA, (GLUCAGON  EMERGENCY) 1 MG KIT Inj 1 mg IM as needed for significant low blood sugar episode 01/26/24   Tysinger, Christiane Cowing, PA-C  glucose blood test strip 2 (two) times daily. Patient not taking: Reported on 06/03/2024    [provider]  Insulin  Pen Needle (BD PEN NEEDLE NANO U/F) 32G X  4 MM MISC Use once a day with insulin  pen 09/12/23   Tysinger, Christiane Cowing, PA-C  LANTUS  SOLOSTAR 100 UNIT/ML Solostar Pen INJECT 35 UNITS INTO THE SKIN AT BEDTIME. Patient taking differently: Inject 30 Units into the skin at bedtime. 04/16/24   Tysinger, Christiane Cowing, PA-C  lidocaine  (LIDODERM ) 5 % Place 1 patch onto the skin daily. Remove & Discard patch within 12 hours or as directed by MD 02/09/23   Delray Fielding, PA-C  Multiple Vitamins-Minerals (MULTIVITAMIN WITH MINERALS) tablet Take 1 tablet by mouth every other day.     [provider]  Semaglutide , 2 MG/DOSE, (OZEMPIC , 2 MG/DOSE,) 8 MG/3ML SOPN Inject 2 mg into the skin once a week. Patient not taking: Reported on 06/03/2024 04/23/24   Claudene Crystal, PA-C  venlafaxine  (EFFEXOR ) 25 MG tablet TAKE 1 TABLET BY MOUTH TWICE A DAY 04/24/24   Tysinger, Christiane Cowing, PA-C  Vitamin D , Ergocalciferol , (DRISDOL ) 1.25 MG (50000 UNIT) CAPS capsule Take 1 capsule (50,000 Units total) by mouth every 7 (seven) days. 09/12/23   Tysinger, Christiane Cowing, PA-C    Allergies: Metformin , Metformin  and related, Shellfish allergy, Iodinated contrast media, Adhesive [tape], Chantix [varenicline], Codeine, Diphenhydramine, Hydrocodone -acetaminophen , Hydrocodone -acetaminophen , Penicillin g, Varenicline tartrate, Latex, and Penicillins    Review of Systems  Updated Vital Signs BP (!) 141/78 (BP Location: Left Arm)   Pulse (!) 56   Temp  97.8 F (36.6 C) (Oral)   Resp 16   SpO2 100%   Physical Exam Constitutional:      General: She is not in acute distress. HENT:     Head: Normocephalic and atraumatic.   Eyes:     Conjunctiva/sclera: Conjunctivae normal.     Pupils: Pupils are equal, round, and reactive to light.    Cardiovascular:     Rate and Rhythm: Normal rate and regular rhythm.  Pulmonary:     Effort: Pulmonary effort is normal. No respiratory distress.  Abdominal:     General: There is no distension.     Tenderness: There is no abdominal tenderness.    Skin:    General: Skin is warm and dry.   Neurological:     General: No focal deficit present.     Mental Status: She is alert. Mental status is at baseline.   Psychiatric:        Mood and Affect: Mood normal.        Behavior: Behavior normal.     (all labs ordered are listed, but only abnormal results are displayed) Labs Reviewed  COMPREHENSIVE METABOLIC PANEL WITH GFR - Abnormal; Notable for the following components:      Result Value   Glucose, Bld 333 (*)    ALT 45 (*)    All other components within normal limits  CBC WITH DIFFERENTIAL/PLATELET - Abnormal; Notable for the following components:   Platelets 142 (*)    All other components within normal limits  URINALYSIS, ROUTINE W REFLEX MICROSCOPIC - Abnormal; Notable for the following components:   Color, Urine STRAW (*)    Glucose, UA >=500 (*)    All other components within normal limits  CBG MONITORING, ED - Abnormal; Notable for the following components:   Glucose-Capillary 253 (*)    All other components within normal limits    EKG: None  Radiology: No results found.   Procedures   Medications Ordered in the ED - No data to display                                  Medical Decision Making Amount and/or Complexity of Data Reviewed Labs: ordered.   Was a well-appearing patient here with asymptomatic hyperglycemia.  Her workup has been unremarkable.  No ketones in the urine, no elevated anion gap, low suspicion for DKA.  She does have some elevated blood sugar initially at 333, trending downwards at 253 and then 160 on her own check after 4 hours in the ED.  I do not believe she is requiring hospitalization or any further workup in the ED.  I do not see evidence of infection or sepsis.  Advised that she follow-up with her PCP for this issue, but she is stable for discharge.     Final diagnoses:  Hyperglycemia    ED Discharge Orders     None          [name], MD 06/07/24 [phone]

## 2024-06-06 NOTE — ED Notes (Signed)
 Pt ambulated to fast track desk. Pt presented her tracker and showed that CBG was currently reading in the 160s (had been 399 upon arrival).

## 2024-06-12 ENCOUNTER — Ambulatory Visit (INDEPENDENT_AMBULATORY_CARE_PROVIDER_SITE_OTHER): Admitting: Family Medicine

## 2024-06-12 ENCOUNTER — Encounter: Payer: Self-pay | Admitting: Family Medicine

## 2024-06-12 VITALS — BP 120/70 | HR 86 | Wt 136.0 lb

## 2024-06-12 DIAGNOSIS — E1165 Type 2 diabetes mellitus with hyperglycemia: Secondary | ICD-10-CM

## 2024-06-12 NOTE — Progress Notes (Signed)
   Subjective:    Patient ID: Bailey [name], female    DOB: Jun 04, 1956, 68 y.o.   MRN: [id number]  HPI She is here for recheck on her blood sugar.  She is using Dexcom.  Presently she is taking 35 units of Lantus  as well as Jardiance  and Ozempic .  None these have changed.  She has been maintaining their same diet and exercise pattern.  She usually gives her Lantus  in the evening.   Review of Systems     Objective:    Physical Exam Alert and in no distress.  Her Dexcom was reviewed with her and it looks as if past wants time her blood sugar goes up in spite of nothing else changing. The emergency room record was reviewed.      Assessment & Plan:  Uncontrolled type 2 diabetes mellitus with hyperglycemia (HCC) go to 38 units and monitor your sugars like you been doing that and lets work on getting your average blood sugar down to 180.  come back here in 2 weeks with your monitor and we can look at everything real-time.  Keep increasing the medicine to get your overall blood sugar down below 180.  Leave a message on MyChart if there is any problem It almost looks as if her Lantus  is wearing off after about 15 hours.

## 2024-06-12 NOTE — Patient Instructions (Addendum)
 go to 38 units and monitor your sugars like you been doing that and lets work on getting your average blood sugar down to 180.  come back here in 2 weeks with your monitor and we can look at everything real-time.  Keep increasing the medicine to get your overall blood sugar down below 180.  Leave a message on MyChart if there is any problem

## 2024-06-13 ENCOUNTER — Telehealth: Payer: Self-pay

## 2024-06-13 NOTE — Progress Notes (Signed)
   06/13/2024  Patient ID: Bailey [name], female   DOB: 07-03-1956, 68 y.o.   MRN: [id number]  Contacted patient to follow up on her request for a new Dexcom G7 Receiver. Patient confirms she does need one, placing a receiver at the front office for pick up today.  Patient aware and will come today.  Carnell Christian, PharmD Clinical Pharmacist [phone]

## 2024-06-20 ENCOUNTER — Other Ambulatory Visit

## 2024-06-20 DIAGNOSIS — E1165 Type 2 diabetes mellitus with hyperglycemia: Secondary | ICD-10-CM

## 2024-06-20 NOTE — Progress Notes (Signed)
   06/20/2024 Name: Bailey [name] MRN: [id number] DOB: Dec 12, 1956  Chief Complaint  Patient presents with   Medication Management   Diabetes    Bailey [name] is a 68 y.o. year old female who presented for a telephone visit.   They were referred to the pharmacist by a quality report for assistance in managing diabetes.    Subjective:  Care Team: Primary Care Provider: Bulah Alm RAMAN, PA-C ; Next Scheduled Visit: 06/26/24  Medication Access/Adherence  Current Pharmacy:  [id number] - [place], [place] - [address] AT [facility] [address] [place] [address] Phone: [phone] Fax: [phone]   Patient reports affordability concerns with their medications: No  Patient reports access/transportation concerns to their pharmacy: No  Patient reports adherence concerns with their medications:  No     Diabetes:  Current medications: Jardiance  10mg , Lantus  35 units, Ozempic  2mg  Medications tried in the past: Glimepiride , Metformin , Januvia   Current glucose readings:     Observed patterns: Sugars are improving. Is having a low in the 60s, usually around 68 every evening. Gets up and corrects with a little bit of juice.  Patient denies hypoglycemic s/sx including dizziness, shakiness, sweating. Patient denies hyperglycemic symptoms including polyuria, polydipsia, polyphagia, nocturia, neuropathy, blurred vision.    Objective:  Lab Results  Component Value Date   HGBA1C 8.9 (H) 04/24/2024    Lab Results  Component Value Date   CREATININE 0.75 06/06/2024   BUN 17 06/06/2024   NA 139 06/06/2024   K 4.1 06/06/2024   CL 105 06/06/2024   CO2 24 06/06/2024    Lab Results  Component Value Date   CHOL 75 (L) 04/24/2024   HDL 33 (L) 04/24/2024   LDLCALC 26 04/24/2024   LDLDIRECT 45 06/10/2016   TRIG 72 04/24/2024   CHOLHDL 2.3 04/24/2024    Medications Reviewed Today   Medications were  not reviewed in this encounter       Assessment/Plan:   Diabetes: - Currently uncontrolled - Reviewed long term cardiovascular and renal outcomes of uncontrolled blood sugar - Reviewed goal A1c, goal fasting, and goal 2 hour post prandial glucose - Reviewed dietary modifications including low carb diet - Recommend to continue current medication therapy at this time. Consider increasing Jardiance  to 25mg  and decreasing lantus  to address overnight lows at next PCP visit if they continue over the next week  - Patient denies personal or family history of multiple endocrine neoplasia type 2, medullary thyroid  cancer; personal history of pancreatitis or gallbladder disease. - Recommend to check glucose continuously with sensor, successfully connected patients CGM with office portal for easy review of sugars at times of appts   Follow Up Plan: 07/25/24  Jon VEAR Lindau, PharmD Clinical Pharmacist [phone]

## 2024-06-26 ENCOUNTER — Ambulatory Visit (INDEPENDENT_AMBULATORY_CARE_PROVIDER_SITE_OTHER): Admitting: Medical

## 2024-06-26 VITALS — BP 108/60 | HR 86 | Wt 131.2 lb

## 2024-06-26 DIAGNOSIS — E1165 Type 2 diabetes mellitus with hyperglycemia: Secondary | ICD-10-CM

## 2024-06-26 DIAGNOSIS — Z79899 Other long term (current) drug therapy: Secondary | ICD-10-CM

## 2024-06-26 DIAGNOSIS — E162 Hypoglycemia, unspecified: Secondary | ICD-10-CM

## 2024-06-26 DIAGNOSIS — E109 Type 1 diabetes mellitus without complications: Secondary | ICD-10-CM

## 2024-06-26 MED ORDER — LANTUS SOLOSTAR 100 UNIT/ML ~~LOC~~ SOPN: 35.0000 [IU] | PEN_INJECTOR | Freq: Every day | SUBCUTANEOUS | 2 refills | Status: DC

## 2024-06-26 NOTE — Progress Notes (Signed)
 Subjective:  Bailey [name] is a 68 y.o. female who presents for Chief Complaint  Patient presents with   Follow-up    Follow-up on Blood sugars, BS are dropping lower. Dropped to 54 Monday night. Has not taken any insulin  since "Sunday. Right now BS is 117- has not eaten anything so far this morning,     Here for follow-up on blood sugars.  She has diabetes.  She was seen here recently on June 18 as a hospital follow-up for elevated blood sugars.  On 06/12/2024 she was continued on Ozempic 2 mg, Jardiance 10 mg, but her Lantus was increased from 35 to 38 units.  She had went to the emergency department on 06/06/24, for blood sugar reading close to 400.  Back in April she was on Lantus 15 units, Jardiance 10 mg, and Ozempic 2 mg.  In April her blood sugars were not controlled  Since her visit here on 06/12/2024 she has been seeing mostly normal readings but even a few low readings.  2 different times in the past week and a half she had 1 reading that was 54, another reading that was 64 and felt a little weak.  She ended up taking something to bring her blood sugar up.  This has happened most the time at night or early in the morning.  She takes her Lantus at bedtime.  She notes that in the hot weather months like right now she tends to eat less food overall, is not as hungry  Over the past 4 days he has not taken Lantus at all.  She has not taken Ozempic in a week.  She last took her Jardiance 2 days ago.  Despite being off of this medicine her blood sugars today are running 109-113 fasting.  No other aggravating or relieving factors.    No other c/o.  Past Medical History:  Diagnosis Date   Arthritis    generalized   Cataract    LEFT eye   Cervical radiculopathy at C5    Dupuytren's contracture of both hands    L > R   Dyslipidemia    History of adenomatous polyp of colon    1997 and 2003     History of chronic pelvic inflammatory disease    History of gastric ulcer     20" 08   Hypertriglyceridemia    on meds   Osteoporosis    Right carpal tunnel syndrome    Seasonal allergies    Type 2 diabetes mellitus, uncontrolled    last A1c 8.9 on 07-07-2016   Vitamin D  deficiency    Current Outpatient Medications on File Prior to Visit  Medication Sig Dispense Refill   atorvastatin  (LIPITOR) 40 MG tablet Take 1 tablet (40 mg total) by mouth daily. 90 tablet 3   CALCIUM  PO Take 600 mg by mouth daily.     ciclopirox  (PENLAC ) 8 % solution Apply topically at bedtime. Apply over nail and surrounding skin. Apply daily over previous coat. After seven (7) days, may remove with alcohol and continue cycle. 6.6 mL 5   Continuous Blood Gluc Receiver (DEXCOM G7 RECEIVER) DEVI UAD 1 each 0   Continuous Glucose Sensor (DEXCOM G7 SENSOR) MISC USE TO CHECK BLOOD SUGAR THREE TIMES DAILY. CHANGE SENSORS ONCE EVERY 10 DAYS. 3 each 3   empagliflozin  (JARDIANCE ) 10 MG TABS tablet Take 1 tablet (10 mg total) by mouth daily. 90 tablet 3   gabapentin  (NEURONTIN ) 300 MG capsule Take 1 capsule (300 mg total)  by mouth at bedtime as needed. TAKE 1 CAPSULE(300 MG) BY MOUTH AT BEDTIME 90 capsule 3   Glucagon , rDNA, (GLUCAGON  EMERGENCY) 1 MG KIT Inj 1 mg IM as needed for significant low blood sugar episode 1 kit 0   lidocaine  (LIDODERM ) 5 % Place 1 patch onto the skin daily. Remove & Discard patch within 12 hours or as directed by MD 30 patch 0   Multiple Vitamins-Minerals (MULTIVITAMIN WITH MINERALS) tablet Take 1 tablet by mouth every other day.      venlafaxine  (EFFEXOR ) 25 MG tablet TAKE 1 TABLET BY MOUTH TWICE A DAY 180 tablet 0   Vitamin D , Ergocalciferol , (DRISDOL ) 1.25 MG (50000 UNIT) CAPS capsule Take 1 capsule (50,000 Units total) by mouth every 7 (seven) days. 12 capsule 3   glucose blood test strip 2 (two) times daily.     Insulin  Pen Needle (BD PEN NEEDLE NANO U/F) 32G X 4 MM MISC Use once a day with insulin  pen 100 each 6   No current facility-administered medications on file prior  to visit.     The following portions of the patient's history were reviewed and updated as appropriate: allergies, current medications, past family history, past medical history, past social history, past surgical history and problem list.  ROS Otherwise as in subjective above  Objective: BP 108/60   Pulse 86   Wt 131 lb 3.2 oz (59.5 kg)   SpO2 99%   BMI 24.79 kg/m   Wt Readings from Last 3 Encounters:  06/26/24 131 lb 3.2 oz (59.5 kg)  06/12/24 136 lb (61.7 kg)  06/03/24 134 lb 3.2 oz (60.9 kg)   BP Readings from Last 3 Encounters:  06/26/24 108/60  06/12/24 120/70  06/06/24 (!) 141/78    General appearance: alert, no distress, well developed, well nourished Oral cavity: MMM, no lesions Neck: supple, no lymphadenopathy, no thyromegaly, no masses Heart: RRR, normal S1, S2, no murmurs Lungs: CTA bilaterally, no wheezes, rhonchi, or rales Pulses: 2+ radial pulses, 2+ pedal pulses, normal cap refill Ext: no edema   Assessment: Encounter Diagnoses  Name Primary?   Diabetes mellitus, labile (HCC) Yes   Uncontrolled type 2 diabetes mellitus with hyperglycemia (HCC)    High risk medication use    Hypoglycemia      Plan: I suspect she has a lot of diet and consistency.  She also apparently is eating less calories overall in recent weeks since the weather has turned more hot and humid.  She is also on Ozempic  which likely has a huge impact on her appetite.  Given her recent hypoglycemia and fluctuations, we are going to adjust medications today to avoid hypoglycemia  I recommended she eat a more consistent diet day-to-day, avoid sweets and junk food and sugary drinks.  Eat 3 meals a day.  Do not skip meals.  Continue using the Dexcom for glucose monitoring.  Of note in the past 14 days her Dexcom shows average glucose 142, 83% of the time in range, less than 1% low range, 15% high range, 2% very high range, estimated GMI 6.7%   Since you are having some fairly big  fluctuations lately including some low blood sugars, we are going to adjust  your medication  Recommendations: Discontinue Ozempic  2 mg weekly Continue to monitor your blood sugars particularly fasting in the morning and before meals The goal is to be between 80 and 130 fasting and before meals Hold off on the Ozempic  temporarily Continue Jardiance  10mg  1 tablet daily in  the morning Regarding Lantus , use it if sugars are creeping up.  See next step. Since your blood sugars are running around 106 fasting today and off medications for at least 4 days, lets have you continue to monitor your sugars Once you start seeing fasting readings 120 or higher, then add back the Lantus , but use Lantus  10 units daily for the time being .  Take this in the day, not at night If you are seeing higher readings before meals over 130 in the next week or 2, you can go up on the Lantus  2 units per week.  For for example, use the jardiance  daily.  If sugars 120 or higher fasting in the next few days, then add Lantus  10 units daily. If after a week your sugars are still running > 120, you can go up to Lantus  12 units for example.  You can increase Lantus  2 units per week My chart message me or recheck in 2 weeks to see where your sugars are running. Keep your diet consistent, eat 3 meals daily.  Eat a protein and carb together each meal.    Bailey [name] was seen today for follow-up.  Diagnoses and all orders for this visit:  Diabetes mellitus, labile (HCC)  Uncontrolled type 2 diabetes mellitus with hyperglycemia (HCC)  High risk medication use  Hypoglycemia  Other orders -     LANTUS  SOLOSTAR 100 UNIT/ML Solostar Pen; Inject 35 Units into the skin at bedtime.    Follow up: 2 weeks

## 2024-06-26 NOTE — Progress Notes (Signed)
  Blood sugars from 6/19-7/2

## 2024-06-26 NOTE — Patient Instructions (Signed)
 Since you are having some fairly big fluctuations lately including some low blood sugars, we are going to adjust  your medication  Recommendations: Discontinue Ozempic  2 mg weekly Continue to monitor your blood sugars particularly fasting in the morning and before meals The goal is to be between 80 and 130 fasting and before meals Hold off on the Ozempic  temporarily Continue Jardiance  10mg  1 tablet daily in the morning Regarding Lantus , use it if sugars are creeping up.  See next step. Since your blood sugars are running around 106 fasting today and off medications for at least 4 days, lets have you continue to monitor your sugars Once you start seeing fasting readings 120 or higher, then add back the Lantus , but use Lantus  10 units daily for the time being .  Take this in the day, not at night If you are seeing higher readings before meals over 130 in the next week or 2, you can go up on the Lantus  2 units per week.  For for example, use the jardiance  daily.  If sugars 120 or higher fasting in the next few days, then add Lantus  10 units daily. If after a week your sugars are still running > 120, you can go up to Lantus  12 units for example.  You can increase Lantus  2 units per week My chart message me or recheck in 2 weeks to see where your sugars are running. Keep your diet consistent, eat 3 meals daily.  Eat a protein and carb together each meal.

## 2024-07-04 ENCOUNTER — Ambulatory Visit: Payer: Self-pay | Admitting: Internal Medicine

## 2024-07-13 ENCOUNTER — Other Ambulatory Visit: Payer: Self-pay | Admitting: Medical

## 2024-07-17 DIAGNOSIS — E113292 Type 2 diabetes mellitus with mild nonproliferative diabetic retinopathy without macular edema, left eye: Secondary | ICD-10-CM | POA: Diagnosis not present

## 2024-07-17 DIAGNOSIS — H43821 Vitreomacular adhesion, right eye: Secondary | ICD-10-CM | POA: Diagnosis not present

## 2024-07-17 LAB — HM DIABETES EYE EXAM

## 2024-07-18 ENCOUNTER — Ambulatory Visit: Payer: Self-pay | Admitting: Medical

## 2024-07-18 ENCOUNTER — Encounter: Payer: Self-pay | Admitting: Medical

## 2024-07-18 NOTE — Progress Notes (Signed)
 Abstract eye exam if it has not already been abstracted

## 2024-07-19 ENCOUNTER — Telehealth: Payer: Self-pay

## 2024-07-19 NOTE — Progress Notes (Signed)
   07/19/2024  Patient ID: Bailey [name], female   DOB: 09/24/1956, 68 y.o.   MRN: [id number]  Pharmacy Quality Measure Review  This patient is appearing on a report for being at risk of failing the adherence measure for cholesterol (statin) medications this calendar year.   Medication: Atorvastatin  40mg  Last fill date: 02/21/24 for 90 day supply  Contacted pharmacy to facilitate refills.  Jon VEAR Lindau, PharmD Clinical Pharmacist [phone]

## 2024-07-25 ENCOUNTER — Other Ambulatory Visit (INDEPENDENT_AMBULATORY_CARE_PROVIDER_SITE_OTHER)

## 2024-07-25 DIAGNOSIS — E1165 Type 2 diabetes mellitus with hyperglycemia: Secondary | ICD-10-CM

## 2024-07-25 NOTE — Progress Notes (Signed)
 07/25/2024 Name: Georganna Maxson MRN: [id number] DOB: 1956-10-30  Chief Complaint  Patient presents with   Medication Management    Karla Pavone is a 68 y.o. year old female who presented for a telephone visit.   They were referred to the pharmacist by a quality report for assistance in managing diabetes.    Subjective:  Care Team: Primary Care Provider: Bulah Alm RAMAN, PA-C ; Next Scheduled Visit: 10/10/24  Medication Access/Adherence  Current Pharmacy:  [id number] - [place], [place] - [address] AT [facility] [address] [place] [address] Phone: [phone] Fax: [phone]   Patient reports affordability concerns with their medications: No  Patient reports access/transportation concerns to their pharmacy: No  Patient reports adherence concerns with their medications:  No     Diabetes:  Current medications: Jardiance  10mg , Lantus  20 units Medications tried in the past: Glimepiride , Metformin , Januvia , Ozempic   Current glucose readings:     Observed patterns: Sugars are uncontrolled since last PCP visit. Elevations are to be expected as lantus  is titrated up after being decreased to address BG lows.   Patient just increased to 20 units yesterday. Also, reports giving herself lantus  at inconsistent times of day (sometimes morning and sometimes at night) and gave an extra second injection when sugars went up to 400 overnight.  Patient denies hypoglycemic s/sx including dizziness, shakiness, sweating. Patient denies hyperglycemic symptoms including polyuria, polydipsia, polyphagia, nocturia, neuropathy, blurred vision.    Objective:  Lab Results  Component Value Date   HGBA1C 8.9 (H) 04/24/2024    Lab Results  Component Value Date   CREATININE 0.75 06/06/2024   BUN 17 06/06/2024   NA 139 06/06/2024   K 4.1 06/06/2024   CL 105 06/06/2024   CO2 24 06/06/2024    Lab  Results  Component Value Date   CHOL 75 (L) 04/24/2024   HDL 33 (L) 04/24/2024   LDLCALC 26 04/24/2024   LDLDIRECT 45 06/10/2016   TRIG 72 04/24/2024   CHOLHDL 2.3 04/24/2024    Medications Reviewed Today     Reviewed by Lionell Jon DEL, RPH (Pharmacist) on 07/25/24 at 1549  Med List Status: <None>   Medication Order Taking? Sig Documenting Provider Last Dose Status Informant  atorvastatin  (LIPITOR) 40 MG tablet [id number] Yes Take 1 tablet (40 mg total) by mouth daily. Bulah Alm RAMAN DEVONNA  Active   CALCIUM  PO [id number] Yes Take 600 mg by mouth daily. [provider]  Active   ciclopirox  (PENLAC ) 8 % solution [id number] Yes Apply topically at bedtime. Apply over nail and surrounding skin. Apply daily over previous coat. After seven (7) days, may remove with alcohol and continue cycle. Tysinger, Alm RAMAN, PA-C  Active            Med Note BEVERLEE, VERONICA F   Tue Apr 23, 2024  9:04 AM) As needed  Continuous Blood Gluc Receiver [facility] G7 [place]) [place] [id number]  UAD Vicci Barnie NOVAK, MD  Active   Continuous Glucose Sensor (DEXCOM G7 SENSOR) [place] [id number] Yes USE TO CHECK BLOOD SUGAR THREE TIMES DAILY. CHANGE SENSORS ONCE EVERY 10 DAYS. Tysinger, Alm RAMAN, PA-C  Active   empagliflozin  (JARDIANCE ) 10 MG TABS tablet [id number] Yes Take 1 tablet (10 mg total) by mouth daily. Tysinger, Alm RAMAN, PA-C  Active   gabapentin  (NEURONTIN ) 300 MG capsule [id number] Yes Take 1 capsule (300 mg total) by mouth at bedtime as needed. TAKE 1 CAPSULE(300 MG) BY MOUTH AT  BEDTIME Tysinger, Alm RAMAN, PA-C  Active            Med Note BEVERLEE, VERONICA F   Tue Apr 23, 2024  9:04 AM) As needed  Glucagon , rDNA, (GLUCAGON  EMERGENCY) 1 MG KIT [id number]  Inj 1 mg IM as needed for significant low blood sugar episode Bulah Alm RAMAN, PA-C  Active   glucose blood test strip [id number]  2 (two) times daily. [provider]  Active            Med Note MELANEE, ERICA M   Mon Jun 03, 2024  9:15 AM) Only if  the Dexcon goes out   Insulin  Pen Needle (BD PEN NEEDLE NANO U/F) 32G X 4 MM MISC [id number]  Use once a day with insulin  pen Tysinger, Alm RAMAN, PA-C  Active   LANTUS  SOLOSTAR 100 UNIT/ML Solostar Pen [id number] Yes Inject 35 Units into the skin at bedtime. Tysinger, Alm RAMAN, PA-C  Active   lidocaine  (LIDODERM ) 5 % [id number]  Place 1 patch onto the skin daily. Remove & Discard patch within 12 hours or as directed by MD Emelia Sluder, PA-C  Active            Med Note BEVERLEE, VERONICA F   Tue Apr 23, 2024  9:05 AM) As needed  Multiple Vitamins-Minerals (MULTIVITAMIN WITH MINERALS) tablet [id number] Yes Take 1 tablet by mouth every other day.  [provider]  Active Self  venlafaxine  (EFFEXOR ) 25 MG tablet [id number] Yes TAKE 1 TABLET BY MOUTH TWICE A DAY Tysinger, Alm RAMAN, PA-C  Active   Vitamin D , Ergocalciferol , (DRISDOL ) 1.25 MG (50000 UNIT) CAPS capsule [id number] Yes Take 1 capsule (50,000 Units total) by mouth every 7 (seven) days. Tysinger, Alm RAMAN, PA-C  Active               Assessment/Plan:   Diabetes: - Currently uncontrolled - Reviewed long term cardiovascular and renal outcomes of uncontrolled blood sugar - Reviewed goal A1c, goal fasting, and goal 2 hour post prandial glucose - Reviewed dietary modifications including low carb diet - INCREASE lantus  to 22 units starting tmrw. Counseled on importance of giving dose around same time each day to achieve more consistent glucose levels. Okay to increase by 2 units every week as recommended by PCP at previous visit.  - Patient denies personal or family history of multiple endocrine neoplasia type 2, medullary thyroid  cancer; personal history of pancreatitis or gallbladder disease. -Reach out sooner than scheduled follow up if BG is going above 350 or you begin to have low BG  Follow Up Plan: 08/15/24  Jon VEAR Lindau, PharmD Clinical Pharmacist [phone]

## 2024-08-01 ENCOUNTER — Telehealth: Payer: Self-pay

## 2024-08-01 NOTE — Progress Notes (Unsigned)
   08/01/2024  Patient ID: Bailey [name], female   DOB: Mar 22, 1956, 69 y.o.   MRN: [id number]  Contacted patient to let her know that a handout on appropriate diet choices in diabetes are at the office for pickup, at her request.  At time of call, patient reports her sugars are elevating overnight. Ran dexcom report and obtained below information.   Appears her lantus  is wearing off and not providing coverage past 12-15 hours. Spoke with PCP.  Instructed patient to continue 24 units of lantus  in the morning, and ADD lantus  6 units in evening before bed. Scheduled follow up. Patient will reach out sooner if lows result from this regimen.  Jon VEAR Lindau, PharmD Clinical Pharmacist [phone]

## 2024-08-12 ENCOUNTER — Encounter: Payer: Self-pay | Admitting: Family Medicine

## 2024-08-12 ENCOUNTER — Ambulatory Visit: Admitting: Family Medicine

## 2024-08-12 ENCOUNTER — Ambulatory Visit: Payer: Self-pay

## 2024-08-12 VITALS — BP 124/64 | HR 80 | Ht 62.0 in | Wt 135.4 lb

## 2024-08-12 DIAGNOSIS — I872 Venous insufficiency (chronic) (peripheral): Secondary | ICD-10-CM | POA: Diagnosis not present

## 2024-08-12 DIAGNOSIS — I8391 Asymptomatic varicose veins of right lower extremity: Secondary | ICD-10-CM

## 2024-08-12 DIAGNOSIS — F17200 Nicotine dependence, unspecified, uncomplicated: Secondary | ICD-10-CM

## 2024-08-12 DIAGNOSIS — E1165 Type 2 diabetes mellitus with hyperglycemia: Secondary | ICD-10-CM | POA: Diagnosis not present

## 2024-08-12 DIAGNOSIS — M7989 Other specified soft tissue disorders: Secondary | ICD-10-CM

## 2024-08-12 NOTE — Progress Notes (Signed)
 Chief Complaint  Patient presents with   Leg Swelling    Lower leg/ankle swelling. L worse than R. Noticed it yesterday but thinks it may have started Friday.    Bailey [name] is a 68 year old female with diabetes who presents with left leg swelling.  Swelling and tightness in the left leg, from the calf to the ankle, began on August 10, 2024. The swelling decreases overnight but returns with activity. Elevation reduces the swelling, but it recurs when upright. There is no associated pain.  There is no recent travel, prolonged sitting, surgery, or history of blood clots. She has not experienced leg swelling before. She is diabetic and monitors her salt intake, though she consumed chips and soda over the weekend. She was moderately active with basic cleaning activities.  She had noticed indigestion and chest pain at the onset of her leg swelling on 8/16. She took Tums, elevated her legs.  The chest pain resolved with Tums. She had some mild indigestion yesterday, that resolved on its own.    PMH, PSH, SH reviewed  HLD, DM--working closely with Jon; on insulin .  Ozempic  stopped in July (due to drops in blood sugars) Smoker Mildly low plts Lab Results  Component Value Date   HGBA1C 8.9 (H) 04/24/2024    Current medications include atorvastatin , calcium , Pennlac, Jardiance , gabapentin , and Lantus . Ozempic  was discontinued in July due to hypoglycemia.  Outpatient Encounter Medications as of 08/12/2024  Medication Sig Note   atorvastatin  (LIPITOR) 40 MG tablet Take 1 tablet (40 mg total) by mouth daily.    CALCIUM  PO Take 600 mg by mouth daily.    ciclopirox  (PENLAC ) 8 % solution Apply topically at bedtime. Apply over nail and surrounding skin. Apply daily over previous coat. After seven (7) days, may remove with alcohol and continue cycle. 08/12/2024: As needed   Continuous Blood Gluc Receiver (DEXCOM G7 RECEIVER) DEVI UAD    Continuous Glucose Sensor (DEXCOM G7 SENSOR) MISC  USE TO CHECK BLOOD SUGAR THREE TIMES DAILY. CHANGE SENSORS ONCE EVERY 10 DAYS.    empagliflozin  (JARDIANCE ) 10 MG TABS tablet Take 1 tablet (10 mg total) by mouth daily.    gabapentin  (NEURONTIN ) 300 MG capsule Take 1 capsule (300 mg total) by mouth at bedtime as needed. TAKE 1 CAPSULE(300 MG) BY MOUTH AT BEDTIME 08/12/2024: As needed   LANTUS  SOLOSTAR 100 UNIT/ML Solostar Pen Inject 35 Units into the skin at bedtime. 08/12/2024: 30 units during the day, 6-8qhs   Multiple Vitamins-Minerals (MULTIVITAMIN WITH MINERALS) tablet Take 1 tablet by mouth every other day.     venlafaxine  (EFFEXOR ) 25 MG tablet TAKE 1 TABLET BY MOUTH TWICE A DAY    Vitamin D , Cholecalciferol, 25 MCG (1000 UT) CAPS Take 1,000 Int'l Units by mouth daily.    [DISCONTINUED] Vitamin D , Ergocalciferol , (DRISDOL ) 1.25 MG (50000 UNIT) CAPS capsule Take 1 capsule (50,000 Units total) by mouth every 7 (seven) days.    Glucagon , rDNA, (GLUCAGON  EMERGENCY) 1 MG KIT Inj 1 mg IM as needed for significant low blood sugar episode (Patient not taking: Reported on 08/12/2024)    glucose blood test strip 2 (two) times daily. (Patient not taking: Reported on 08/12/2024) 06/03/2024: Only if the Dexcon goes out    Insulin  Pen Needle (BD PEN NEEDLE NANO U/F) 32G X 4 MM MISC Use once a day with insulin  pen (Patient not taking: Reported on 08/12/2024)    lidocaine  (LIDODERM ) 5 % Place 1 patch onto the skin daily. Remove & Discard patch  within 12 hours or as directed by MD (Patient not taking: Reported on 08/12/2024) 08/12/2024: As needed   No facility-administered encounter medications on file as of 08/12/2024.   Allergies  Allergen Reactions   Metformin  Diarrhea   Metformin  And Related Diarrhea   Shellfish Allergy Anaphylaxis, Hives and Swelling    Lobster, crab (shrimp's ok)   Iodinated Contrast Media Hives    Other Reaction(s): Not available, Not available, Not available, Not available   Adhesive [Tape] Other (See Comments)    blisters   Chantix  [Varenicline] Other (See Comments)    depression   Codeine Other (See Comments)    Unknown childhood allergy   Diphenhydramine Other (See Comments)    Causes drunk feeling I can't function for days/sleepiness   Hydrocodone -Acetaminophen     Hydrocodone -Acetaminophen  Nausea And Vomiting and Diarrhea   Penicillin G    Varenicline Tartrate Other (See Comments)   Latex Other (See Comments) and Itching    blisters   Penicillins Other (See Comments)    Unknown childhood reaction     ROS:  no fever or chills, sometimes gets hot. No URI or allergy symptoms. Occasional mild indigestion, yesterday afternoon, resolved on its own. Required meds 2 nights ago. No nausea, vomiting, bowel changes. No bleeding, bruising or rash. Denies shortness of breath. Denies exertional chest pain. Edema per HPI. No urinary symptoms or vaginal bleeding Occ R knee pain   PHYSICAL EXAM:  BP 124/64   Pulse 80   Ht 5' 2 (1.575 m)   Wt 135 lb 6.4 oz (61.4 kg)   BMI 24.76 kg/m   Wt Readings from Last 3 Encounters:  08/12/24 135 lb 6.4 oz (61.4 kg)  06/26/24 131 lb 3.2 oz (59.5 kg)  06/12/24 136 lb (61.7 kg)   Well-appearing, pleasant female in no distress HEENT: conjunctiva and sclera are clear, EOMI Neck: no lymphadenopathy or mass Heart: regular rate and rhythm Lungs: clear bilaterally Back :no spinal or CA tenderness Abdomen: soft, nontender, no mass Extremities: very slight swelling to R knee, superiorly, no warmth. 2+ pulses. No pitting edema.  Calves nontender.  Negative Homan. Varicosities (2 bulges) noted R lateral lower leg. No varicosities on the left. Max circumference at calf and ankle were symmetric    ASSESSMENT/PLAN:  Leg swelling - intermittent x 2-3 days, LLE only. no associated pain. Suspect venous insufficency.  Low Na diet, compression socks and elevation discussed  Venous insufficiency - suspected as cause for unilateral, very mild edema.  Asymptomatic varicose  veins of right lower extremity  Tobacco dependence - counseled re: smoking cessation, risks. Encouraged to contact her insurance re: cessation programs, vs 1800QUITNOW  Uncontrolled type 2 diabetes mellitus with hyperglycemia (HCC) - being managed by [facility]   F/u with [facility] as scheduled in October    Continue to limit the sodium in your diet. I recommend wearing compression stockings/socks (put them on first thing in the morning). Stay active during the day (using the calf muscles helps get the blood back to the heart). Elevate the legs as needed for swelling. Return if you have persistent swelling in the leg, especially if associated with any pain.

## 2024-08-12 NOTE — Telephone Encounter (Signed)
 FYI Only or Action Required?: FYI only for provider.  Patient was last seen in primary care on 06/26/2024 by Bulah Alm RAMAN, PA-C.  Called Nurse Triage reporting Leg Swelling.  Symptoms began yesterday.  Interventions attempted: Nothing.  Symptoms are: unchanged.  Triage Disposition: See Physician Within 24 Hours  Patient/caregiver understands and will follow disposition?: Yes                Copied from CRM #[id number]. Topic: Clinical - Red Word Triage >> Aug 12, 2024 11:17 AM Graeme ORN wrote: Red Word that prompted transfer to Nurse Triage: Swelling left leg - calf tight down to ankle - no falls - never happened before. Reason for Disposition  [1] MODERATE leg swelling (e.g., swelling extends up to knees) AND [2] new-onset or getting worse  Answer Assessment - Initial Assessment Questions 1. ONSET: When did the swelling start? (e.g., minutes, hours, days)     yesterday 2. LOCATION: What part of the leg is swollen?  Are both legs swollen or just one leg?     Left leg  3. SEVERITY: How bad is the swelling? (e.g., localized; mild, moderate, severe)     moderate 4. REDNESS: Is there redness or signs of infection?     denies 5. PAIN: Is the swelling painful to touch? If Yes, ask: How painful is it?   (Scale 1-10; mild, moderate or severe)     no 6. FEVER: Do you have a fever? If Yes, ask: What is it, how was it measured, and when did it start?      no 7. CAUSE: What do you think is causing the leg swelling?     unknown 8. MEDICAL HISTORY: Do you have a history of blood clots (e.g., DVT), cancer, heart failure, kidney disease, or liver failure?     no 9. RECURRENT SYMPTOM: Have you had leg swelling before? If Yes, ask: When was the last time? What happened that time?     no 10. OTHER SYMPTOMS: Do you have any other symptoms? (e.g., chest pain, difficulty breathing)       Upset stomach 11. PREGNANCY: Is there any chance you are  pregnant? When was your last menstrual period?       na  Protocols used: Leg Swelling and Edema-A-AH

## 2024-08-12 NOTE — Patient Instructions (Addendum)
 LEFT LOWER EXTREMITY SWELLING: Swelling in your left leg is likely due to venous insufficiency, which means the valves in your veins are not working properly. -Limit your sodium intake. -Wear knee-high compression stockings or socks during prolonged sitting or standing. -Elevate your legs as needed to reduce swelling. -Stay active to help promote blood flow in your legs. -Return to the clinic if the swelling persists, especially if it becomes painful.  Return if you have persistent swelling in the leg, especially if associated with any pain.  I highly encourage you to work on quitting smoking. Start thinking about why/when you smoke (habit, boredom, stress) in order to come up with effective strategies to cut back or quit. Available resources to help you quit include free counseling through [facility] Quitline ([place]Quitline.[facility] or 1-800-QUITNOW), check through your insurance to see what they have to help you quit smoking.

## 2024-08-15 ENCOUNTER — Other Ambulatory Visit

## 2024-08-15 DIAGNOSIS — E109 Type 1 diabetes mellitus without complications: Secondary | ICD-10-CM

## 2024-08-15 NOTE — Progress Notes (Signed)
 08/15/2024 Name: Bailey [name] MRN: [id number] DOB: 12-28-1955  Chief Complaint  Patient presents with   Medication Management   Diabetes    [name] is a 68 y.o. year old female who presented for a telephone visit.   They were referred to the pharmacist by a quality report for assistance in managing diabetes.    Subjective:  Care Team: Primary Care Provider: Bulah Alm RAMAN, PA-C ; Next Scheduled Visit: 10/10/24  Medication Access/Adherence  Current Pharmacy:  [id number] - [place], [place] - [address] [place] [address] Phone: [phone] Fax: [phone]   Patient reports affordability concerns with their medications: No  Patient reports access/transportation concerns to their pharmacy: No  Patient reports adherence concerns with their medications:  No     Diabetes:  Current medications: Jardiance  10mg , Lantus  30 units in morning and 10 units at night Medications tried in the past: Glimepiride , Metformin , Januvia , Ozempic   Current glucose readings:     Observed patterns: Sugars are uncontrolled since last PCP visit. Better BG overnight with addition of lantus  in evening but overall still elevated   Patient denies hypoglycemic s/sx including dizziness, shakiness, sweating. Patient denies hyperglycemic symptoms including polyuria, polydipsia, polyphagia, nocturia, neuropathy, blurred vision.  Patient would like to go back on her ozempic  if possible because she feels she tolerated it well and sugars looked better.    Objective:  Lab Results  Component Value Date   HGBA1C 8.9 (H) 04/24/2024    Lab Results  Component Value Date   CREATININE 0.75 06/06/2024   BUN 17 06/06/2024   NA 139 06/06/2024   K 4.1 06/06/2024   CL 105 06/06/2024   CO2 24 06/06/2024    Lab Results  Component Value Date   CHOL 75 (L) 04/24/2024   HDL 33 (L)  04/24/2024   LDLCALC 26 04/24/2024   LDLDIRECT 45 06/10/2016   TRIG 72 04/24/2024   CHOLHDL 2.3 04/24/2024    Medications Reviewed Today     Reviewed by Lionell Jon DEL, RPH (Pharmacist) on 08/15/24 at 1436  Med List Status: <None>   Medication Order Taking? Sig Documenting Provider Last Dose Status Informant  atorvastatin  (LIPITOR) 40 MG tablet [id number] Yes Take 1 tablet (40 mg total) by mouth daily. Bulah Alm RAMAN DEVONNA  Active   CALCIUM  PO [id number] Yes Take 600 mg by mouth daily. [provider]  Active   ciclopirox  (PENLAC ) 8 % solution [id number] Yes Apply topically at bedtime. Apply over nail and surrounding skin. Apply daily over previous coat. After seven (7) days, may remove with alcohol and continue cycle. Tysinger, Alm RAMAN, PA-C  Active            Med Note BEVERLEE LUCIENNE JULIANNA Pablo Aug 12, 2024  1:29 PM) As needed  Continuous Blood Gluc Receiver [facility] G[address] [place]) [place] [phone]  UAD Vicci Barnie NOVAK, MD  Active   Continuous Glucose Sensor (DEXCOM G7 SENSOR) [place] [id number] Yes USE TO CHECK BLOOD SUGAR THREE TIMES DAILY. CHANGE SENSORS ONCE EVERY 10 DAYS. Tysinger, Alm RAMAN, PA-C  Active   empagliflozin  (JARDIANCE ) 10 MG TABS tablet [id number] Yes Take 1 tablet (10 mg total) by mouth daily. Tysinger, Alm RAMAN, PA-C  Active   gabapentin  (NEURONTIN ) 300 MG capsule [id number] Yes Take 1 capsule (300 mg total) by mouth at bedtime as needed. TAKE 1 CAPSULE(300 MG) BY MOUTH AT BEDTIME Tysinger, Alm RAMAN, PA-C  Active  Med Note BEVERLEE LUCIENNE JULIANNA Pablo Aug 12, 2024  1:30 PM) As needed  Glucagon , rDNA, (GLUCAGON  EMERGENCY) 1 MG KIT [id number]  Inj 1 mg IM as needed for significant low blood sugar episode  Patient not taking: Reported on 08/12/2024   Bulah Alm RAMAN, PA-C  Active   glucose blood test strip [id number]  2 (two) times daily.  Patient not taking: Reported on 08/12/2024   [provider]  Active            Med Note MELANEE, ERICA M   Mon Jun 03, 2024  9:15 AM) Only if the Dexcon goes out   Insulin  Pen Needle (BD PEN NEEDLE NANO U/F) 32G X 4 MM MISC [id number]  Use once a day with insulin  pen  Patient not taking: Reported on 08/12/2024   Bulah Alm RAMAN, PA-C  Active   LANTUS  SOLOSTAR 100 UNIT/ML Solostar Pen [id number] Yes Inject 35 Units into the skin at bedtime. Tysinger, Alm RAMAN, PA-C  Active            Med Note BEVERLEE LUCIENNE JULIANNA Pablo Aug 12, 2024  1:30 PM) 30 units during the day, 6-8qhs  lidocaine  (LIDODERM ) 5 % [id number]  Place 1 patch onto the skin daily. Remove & Discard patch within 12 hours or as directed by MD  Patient not taking: Reported on 08/12/2024   Emelia Sluder, PA-C  Active            Med Note BEVERLEE, LUCIENNE JULIANNA Pablo Aug 12, 2024  1:31 PM) As needed  Multiple Vitamins-Minerals (MULTIVITAMIN WITH MINERALS) tablet [id number] Yes Take 1 tablet by mouth every other day.  [provider]  Active Self  venlafaxine  (EFFEXOR ) 25 MG tablet [id number]  TAKE 1 TABLET BY MOUTH TWICE A DAY  Patient not taking: Reported on 08/15/2024   Bulah Alm RAMAN, PA-C  Active   Vitamin D , Cholecalciferol, 25 MCG (1000 UT) CAPS [id number] Yes Take 1,000 Int'l Units by mouth daily.  Patient taking differently: Take 1,000 Int'l Units by mouth daily. Taking 50,000 units a week on Mondays   [provider]  Active               Assessment/Plan:   Diabetes: - Currently uncontrolled - Reviewed long term cardiovascular and renal outcomes of uncontrolled blood sugar - Reviewed goal A1c, goal fasting, and goal 2 hour post prandial glucose - Reviewed dietary modifications including low carb diet - Recommend restarting Ozempic  at 0.5mg , will coordinate with PCP - Patient denies personal or family history of multiple endocrine neoplasia type 2, medullary thyroid  cancer; personal history of pancreatitis or gallbladder disease. -Reach out sooner than scheduled follow up if BG is going above 350 or you begin to have low  BG  Patient also complains of hot flashes and that she could not tolerate effexor . Would like a referral to women's health/gyn, will notify PCP  Follow Up Plan: pending scheduling  Jon VEAR Lindau, PharmD Clinical Pharmacist [phone]

## 2024-08-16 ENCOUNTER — Telehealth: Payer: Self-pay

## 2024-08-16 ENCOUNTER — Other Ambulatory Visit: Payer: Self-pay | Admitting: Medical

## 2024-08-16 DIAGNOSIS — R232 Flushing: Secondary | ICD-10-CM

## 2024-08-16 DIAGNOSIS — E1165 Type 2 diabetes mellitus with hyperglycemia: Secondary | ICD-10-CM

## 2024-08-16 MED ORDER — OZEMPIC (0.25 OR 0.5 MG/DOSE) 2 MG/3ML ~~LOC~~ SOPN: 0.5000 mg | PEN_INJECTOR | SUBCUTANEOUS | 0 refills | Status: DC

## 2024-08-16 NOTE — Progress Notes (Signed)
   08/16/2024  Patient ID: Bailey [name], female   DOB: [birth year], 68 y.o.   MRN: [id number]  Contacted patient via telephone to follow up from our phone call appt yesterday. Instructed to restart Ozempic  at 0.5mg . Patient will pick up pens this weekend and plans to start Monday.  Scheduled follow up for 09/05/24 but patient aware to reach out sooner with any questions/concerns.  Jon VEAR Lindau, PharmD Clinical Pharmacist [phone]

## 2024-08-19 ENCOUNTER — Ambulatory Visit (HOSPITAL_BASED_OUTPATIENT_CLINIC_OR_DEPARTMENT_OTHER)
Admission: RE | Admit: 2024-08-19 | Discharge: 2024-08-19 | Disposition: A | Discharge status: Home / Self Care | Source: Ambulatory Visit | Attending: Medical | Admitting: Medical

## 2024-08-19 DIAGNOSIS — Z13820 Encounter for screening for osteoporosis: Secondary | ICD-10-CM | POA: Insufficient documentation

## 2024-08-19 DIAGNOSIS — M8589 Other specified disorders of bone density and structure, multiple sites: Secondary | ICD-10-CM | POA: Diagnosis not present

## 2024-08-19 DIAGNOSIS — M8580 Other specified disorders of bone density and structure, unspecified site: Secondary | ICD-10-CM | POA: Diagnosis present

## 2024-08-19 DIAGNOSIS — Z780 Asymptomatic menopausal state: Secondary | ICD-10-CM | POA: Diagnosis not present

## 2024-08-20 ENCOUNTER — Ambulatory Visit: Payer: Self-pay | Admitting: Medical

## 2024-08-20 NOTE — Progress Notes (Signed)
 Results through MyChart

## 2024-08-28 ENCOUNTER — Telehealth: Payer: Self-pay | Admitting: Pharmacy Technician

## 2024-08-28 NOTE — Progress Notes (Signed)
 08/28/2024 Name: Bailey [name] MRN: [id number] DOB: 06/30/1956  Patient is appearing on a report for [facility] Diabetes and last engaged with the clinical pharmacist to discuss diabetes on 08/15/2024. Contacted patient today to discuss diabetes management and completed medication review.   Diabetes Plan from last clinical pharmacist appointment:  Diabetes: - Currently uncontrolled - Reviewed long term cardiovascular and renal outcomes of uncontrolled blood sugar - Reviewed goal A1c, goal fasting, and goal 2 hour post prandial glucose - Reviewed dietary modifications including low carb diet - Recommend restarting Ozempic  at 0.5mg , will coordinate with PCP - Patient denies personal or family history of multiple endocrine neoplasia type 2, medullary thyroid  cancer; personal history of pancreatitis or gallbladder disease. -Reach out sooner than scheduled follow up if BG is going above 350 or you begin to have low BG  Patient also complains of hot flashes and that she could not tolerate effexor . Would like a referral to women's health/gyn, will notify PCP  Follow Up Plan: pending scheduling(copy/paste from last note)   Medication Adherence Barriers Identified:  Patient made recommended medication changes per plan: No Patient has not yet re started Ozempic . She informs she [facility] not [facility] stating [facility]. Patient would like for me to call and check on that for her today. [facility] and [facility] staff informs the order was placed on file.CVS staff informs they will process today and patient can pick up in a few hours. [facility] staff informs this will be no charge. Called patient back and informed her she would be able to pick up later today. Patient informs she will pick up and start Ozempic  today. Patient also informs she is taking Jardiance  10mg  daily as well as Lantus  30 units in the morning and 10 units in the evening. She informs having these on hand and  taking as directed Access issues with any new medication or testing device: Yes Ozempic . [facility] and get [facility] to [facility] this for patient today. Patient  informs she will pick up today. Per Dr Annemarie, Jardiance  last filled on 8/19 for 90 days supply and Lantus  on 07/03/2024 for 85 day supply.  Patient is checking blood sugars as prescribed: Yes Patient uses Dexcom CGM to check blood sugar. Sensors last filled on 08/10/24 for 30 day supply. Patient informs her blood sugars are low in the morning around 100 This morning she informs blood sugar started out at 104 upon awakening. She went on a walk and blood sugar went up to 126 and then after breakfast it went up to 240. She informs  from 6am to the time of this call the blood sugar ranged from 104-256. She informs it is normally averaging around 160-180.   Medication Adherence Barriers Addressed/Actions Taken:  Reviewed medication changes per plan from last clinical pharmacist note Medication Access for Ozempic  Contacted pharmacy regarding [facility] prescriptions[facility]. Cost to patient is zero dollars. Patinet inofrms she will pick up today and start the medication. Educated patient to contact pharmacy regarding new prescriptions/refills.  Reviewed instructions for monitoring blood sugars at home and reminded patient to keep a written log to review with pharmacist Reminded patient of date/time of upcoming clinical pharmacist follow up and any upcoming PCP/specialists visits. Patient denies transportation barriers to the appointment. Yes  Next clinical pharmacist appointment is scheduled for: 09/05/2024  Uzair Godley, CPhT [facility] Office: [phone] Email: [email or web address]

## 2024-09-05 ENCOUNTER — Other Ambulatory Visit (INDEPENDENT_AMBULATORY_CARE_PROVIDER_SITE_OTHER)

## 2024-09-05 DIAGNOSIS — E1165 Type 2 diabetes mellitus with hyperglycemia: Secondary | ICD-10-CM

## 2024-09-05 NOTE — Progress Notes (Signed)
 09/05/2024 Name: Bailey [name] MRN: [id number] DOB: 1956/12/09  Chief Complaint  Patient presents with   Medication Management   Diabetes    Bailey [name] is a 68 y.o. year old female who presented for a telephone visit.   They were referred to the pharmacist by a quality report for assistance in managing diabetes.    Subjective:  Care Team: Primary Care Provider: Bulah Alm RAMAN, PA-C ; Next Scheduled Visit: 10/10/24  Medication Access/Adherence  Current Pharmacy:  [id number] - [place], [place] - [address] [place] [address] Phone: [phone] Fax: [phone]   Patient reports affordability concerns with their medications: No  Patient reports access/transportation concerns to their pharmacy: No  Patient reports adherence concerns with their medications:  No     Diabetes:  Current medications: Jardiance  10mg , Lantus  30 units in morning and 10 units at night, Ozempic  0.5mg  (restarted 08/29/24) Medications tried in the past: Glimepiride , Metformin , Januvia   Current glucose readings:     Observed patterns: GMI has improved, down from 8.9 to 8.1 since last appt upon resuming ozempic . No low BG readings   Patient denies hypoglycemic s/sx including dizziness, shakiness, sweating. Patient denies hyperglycemic symptoms including polyuria, polydipsia, polyphagia, nocturia, neuropathy, blurred vision.    Objective:  Lab Results  Component Value Date   HGBA1C 8.9 (H) 04/24/2024    Lab Results  Component Value Date   CREATININE 0.75 06/06/2024   BUN 17 06/06/2024   NA 139 06/06/2024   K 4.1 06/06/2024   CL 105 06/06/2024   CO2 24 06/06/2024    Lab Results  Component Value Date   CHOL 75 (L) 04/24/2024   HDL 33 (L) 04/24/2024   LDLCALC 26 04/24/2024   LDLDIRECT 45 06/10/2016   TRIG 72 04/24/2024   CHOLHDL 2.3 04/24/2024    Medications Reviewed  Today     Reviewed by Lionell Jon DEL, RPH (Pharmacist) on 09/05/24 at 1348  Med List Status: <None>   Medication Order Taking? Sig Documenting Provider Last Dose Status Informant  atorvastatin  (LIPITOR) 40 MG tablet [id number]  Take 1 tablet (40 mg total) by mouth daily. Bailey [name] DEVONNA  Active   CALCIUM  PO [id number]  Take 600 mg by mouth daily. [provider]  Active   ciclopirox  (PENLAC ) 8 % solution [id number]  Apply topically at bedtime. Apply over nail and surrounding skin. Apply daily over previous coat. After seven (7) days, may remove with alcohol and continue cycle. Tysinger, Alm RAMAN, PA-C  Active            Med Note BEVERLEE LUCIENNE JULIANNA Pablo Aug 12, 2024  1:29 PM) As needed  Continuous Blood Gluc Receiver [facility] G7 [place]) [place] [id number]  UAD Vicci Barnie NOVAK, MD  Active   Continuous Glucose Sensor (DEXCOM G7 SENSOR) [place] [id number]  USE TO CHECK BLOOD SUGAR THREE TIMES DAILY. CHANGE SENSORS ONCE EVERY 10 DAYS. Tysinger, Alm RAMAN, PA-C  Active   empagliflozin  (JARDIANCE ) 10 MG TABS tablet [id number]  Take 1 tablet (10 mg total) by mouth daily. Tysinger, Alm RAMAN, PA-C  Active   gabapentin  (NEURONTIN ) 300 MG capsule [id number]  Take 1 capsule (300 mg total) by mouth at bedtime as needed. TAKE 1 CAPSULE(300 MG) BY MOUTH AT BEDTIME Tysinger, Alm RAMAN, PA-C  Active            Med Note BEVERLEE, LUCIENNE JULIANNA Pablo Aug 12, 2024  1:30 PM) As needed  Glucagon , rDNA, (GLUCAGON  EMERGENCY) 1 MG KIT [id number]  Inj 1 mg IM as needed for significant low blood sugar episode  Patient not taking: Reported on 08/12/2024   Bailey Alm RAMAN, PA-C  Active   glucose blood test strip [id number]  2 (two) times daily.  Patient not taking: Reported on 08/12/2024   [provider]  Active            Med Note MELANEE, ERICA M   Mon Jun 03, 2024  9:15 AM) Only if the Dexcon goes out   Insulin  Pen Needle (BD PEN NEEDLE NANO U/F) 32G X 4 MM MISC [id number]  Use once a day with insulin  pen  Patient  not taking: Reported on 08/12/2024   Bailey Alm RAMAN, PA-C  Active   LANTUS  SOLOSTAR 100 UNIT/ML Solostar Pen [id number]  Inject 35 Units into the skin at bedtime. Tysinger, Alm RAMAN, PA-C  Active            Med Note BEVERLEE LUCIENNE JULIANNA Pablo Aug 12, 2024  1:30 PM) 30 units during the day, 6-8qhs  lidocaine  (LIDODERM ) 5 % [id number]  Place 1 patch onto the skin daily. Remove & Discard patch within 12 hours or as directed by MD  Patient not taking: Reported on 08/12/2024   Emelia Sluder, PA-C  Active            Med Note BEVERLEE, LUCIENNE JULIANNA Pablo Aug 12, 2024  1:31 PM) As needed  Multiple Vitamins-Minerals (MULTIVITAMIN WITH MINERALS) tablet [id number]  Take 1 tablet by mouth every other day.  [provider]  Active Self  Semaglutide ,0.25 or 0.5MG /DOS, (OZEMPIC , 0.25 OR 0.5 MG/DOSE,) 2 MG/3ML SOPN [id number]  Inject 0.5 mg into the skin once a week. Tysinger, Alm RAMAN, PA-C  Active   venlafaxine  (EFFEXOR ) 25 MG tablet [id number]  TAKE 1 TABLET BY MOUTH TWICE A DAY  Patient not taking: Reported on 08/15/2024   Bailey Alm RAMAN, PA-C  Active   Vitamin D , Cholecalciferol, 25 MCG (1000 UT) CAPS [id number]  Take 1,000 Int'l Units by mouth daily.  Patient taking differently: Take 1,000 Int'l Units by mouth daily. Taking 50,000 units a week on Mondays   [provider]  Active               Assessment/Plan:   Diabetes: - Currently uncontrolled but improving - Reviewed long term cardiovascular and renal outcomes of uncontrolled blood sugar - Reviewed goal A1c, goal fasting, and goal 2 hour post prandial glucose - Reviewed dietary modifications including low carb diet -INCREASE lantus  evening dose to 12 units. Continue jardiance  10mg  lantus  30 units qam and Ozempic  0.5mg  - Patient denies personal or family history of multiple endocrine neoplasia type 2, medullary thyroid  cancer; personal history of pancreatitis or gallbladder disease. -Reach out sooner than scheduled follow up if BG is  going above 350 or you begin to have low BG   Follow Up Plan: 09/19/24  Jon VEAR Lindau, PharmD Clinical Pharmacist [phone]

## 2024-09-19 ENCOUNTER — Other Ambulatory Visit (INDEPENDENT_AMBULATORY_CARE_PROVIDER_SITE_OTHER)

## 2024-09-19 DIAGNOSIS — E1165 Type 2 diabetes mellitus with hyperglycemia: Secondary | ICD-10-CM

## 2024-09-19 NOTE — Progress Notes (Signed)
 09/19/2024 Name: Bailey [name] MRN: [id number] DOB: [birth year]  Chief Complaint  Patient presents with   Medication Management   Diabetes    Bailey [name] is a 68 y.o. year old female who presented for a telephone visit.   They were referred to the pharmacist by a quality report for assistance in managing diabetes.    Subjective:  Care Team: Primary Care Provider: Bulah Alm RAMAN, PA-C ; Next Scheduled Visit: 10/10/24  Medication Access/Adherence  Current Pharmacy:  [id number] - [place], [place] - [address] [place] [address] Phone: [phone] Fax: [phone]   Patient reports affordability concerns with their medications: No  Patient reports access/transportation concerns to their pharmacy: No  Patient reports adherence concerns with their medications:  No     Diabetes:  Current medications: Jardiance  10mg , Lantus  30 units in morning and 12 units at night, Ozempic  0.5mg  (restarted 08/29/24) Medications tried in the past: Glimepiride , Metformin , Januvia   Current glucose readings:     Observed patterns: GMI has improved, down from 8.9 to 7.3 since last appt upon resuming ozempic . No low BG readings   Patient denies hypoglycemic s/sx including dizziness, shakiness, sweating. Patient denies hyperglycemic symptoms including polyuria, polydipsia, polyphagia, nocturia, neuropathy, blurred vision.    Objective:  Lab Results  Component Value Date   HGBA1C 8.9 (H) 04/24/2024    Lab Results  Component Value Date   CREATININE 0.75 06/06/2024   BUN 17 06/06/2024   NA 139 06/06/2024   K 4.1 06/06/2024   CL 105 06/06/2024   CO2 24 06/06/2024    Lab Results  Component Value Date   CHOL 75 (L) 04/24/2024   HDL 33 (L) 04/24/2024   LDLCALC 26 04/24/2024   LDLDIRECT 45 06/10/2016   TRIG 72 04/24/2024   CHOLHDL 2.3 04/24/2024    Medications Reviewed  Today     Reviewed by Lionell Jon DEL, RPH (Pharmacist) on 09/19/24 at 1441  Med List Status: <None>   Medication Order Taking? Sig Documenting Provider Last Dose Status Informant  atorvastatin  (LIPITOR) 40 MG tablet [id number]  Take 1 tablet (40 mg total) by mouth daily. Bulah Alm RAMAN DEVONNA  Active   CALCIUM  PO [id number]  Take 600 mg by mouth daily. [provider]  Active   ciclopirox  (PENLAC ) 8 % solution [id number]  Apply topically at bedtime. Apply over nail and surrounding skin. Apply daily over previous coat. After seven (7) days, may remove with alcohol and continue cycle. Tysinger, Alm RAMAN, PA-C  Active            Med Note BEVERLEE LUCIENNE JULIANNA Pablo Aug 12, 2024  1:29 PM) As needed  Continuous Blood Gluc Receiver [facility] G7 [place]) [place] [id number]  UAD Vicci Barnie NOVAK, MD  Active   Continuous Glucose Sensor (DEXCOM G7 SENSOR) [place] [id number]  USE TO CHECK BLOOD SUGAR THREE TIMES DAILY. CHANGE SENSORS ONCE EVERY 10 DAYS. Tysinger, Alm RAMAN, PA-C  Active   empagliflozin  (JARDIANCE ) 10 MG TABS tablet [id number]  Take 1 tablet (10 mg total) by mouth daily. Tysinger, Alm RAMAN, PA-C  Active   gabapentin  (NEURONTIN ) 300 MG capsule [id number]  Take 1 capsule (300 mg total) by mouth at bedtime as needed. TAKE 1 CAPSULE(300 MG) BY MOUTH AT BEDTIME Tysinger, Alm RAMAN, PA-C  Active            Med Note BEVERLEE, LUCIENNE JULIANNA Pablo Aug 12, 2024  1:30 PM) As needed  Glucagon , rDNA, (GLUCAGON  EMERGENCY) 1 MG KIT [id number]  Inj 1 mg IM as needed for significant low blood sugar episode  Patient not taking: Reported on 08/12/2024   Bulah Alm RAMAN, PA-C  Active   glucose blood test strip [id number]  2 (two) times daily.  Patient not taking: Reported on 08/12/2024   [provider]  Active            Med Note MELANEE, ERICA M   Mon Jun 03, 2024  9:15 AM) Only if the Dexcon goes out   Insulin  Pen Needle (BD PEN NEEDLE NANO U/F) 32G X 4 MM MISC [id number]  Use once a day with insulin  pen  Patient  not taking: Reported on 08/12/2024   Bulah Alm RAMAN, PA-C  Active   LANTUS  SOLOSTAR 100 UNIT/ML Solostar Pen [id number]  Inject 35 Units into the skin at bedtime. Tysinger, Alm RAMAN, PA-C  Active            Med Note BEVERLEE LUCIENNE JULIANNA Pablo Aug 12, 2024  1:30 PM) 30 units during the day, 6-8qhs  lidocaine  (LIDODERM ) 5 % [id number]  Place 1 patch onto the skin daily. Remove & Discard patch within 12 hours or as directed by MD  Patient not taking: Reported on 08/12/2024   Emelia Sluder, PA-C  Active            Med Note BEVERLEE, LUCIENNE JULIANNA Pablo Aug 12, 2024  1:31 PM) As needed  Multiple Vitamins-Minerals (MULTIVITAMIN WITH MINERALS) tablet [id number]  Take 1 tablet by mouth every other day.  [provider]  Active Self  Semaglutide ,0.25 or 0.5MG /DOS, (OZEMPIC , 0.25 OR 0.5 MG/DOSE,) 2 MG/3ML SOPN [id number]  Inject 0.5 mg into the skin once a week. Tysinger, Alm RAMAN, PA-C  Active   venlafaxine  (EFFEXOR ) 25 MG tablet [id number]  TAKE 1 TABLET BY MOUTH TWICE A DAY  Patient not taking: Reported on 08/15/2024   Bulah Alm RAMAN, PA-C  Active   Vitamin D , Cholecalciferol, 25 MCG (1000 UT) CAPS [id number]  Take 1,000 Int'l Units by mouth daily.  Patient taking differently: Take 1,000 Int'l Units by mouth daily. Taking 50,000 units a week on Mondays   [provider]  Active               Assessment/Plan:   Diabetes: - Currently uncontrolled but improving - Reviewed long term cardiovascular and renal outcomes of uncontrolled blood sugar - Reviewed goal A1c, goal fasting, and goal 2 hour post prandial glucose - Reviewed dietary modifications including low carb diet -Continue Lantus  30 units in am, 12 units in pm. Consider dose increase to Ozempic  1mg , will discuss with PCP - Patient denies personal or family history of multiple endocrine neoplasia type 2, medullary thyroid  cancer; personal history of pancreatitis or gallbladder disease. -Reach out sooner than scheduled follow up if  BG is going above 350 or you begin to have low BG   Follow Up Plan: 11/07/24 (sees PCP in Oct)  Jon VEAR Lindau, PharmD Clinical Pharmacist [phone]

## 2024-09-20 ENCOUNTER — Telehealth: Payer: Self-pay | Admitting: Medical

## 2024-09-20 NOTE — Telephone Encounter (Signed)
 Patient was identified as falling into the [employer] [place] Measure - Diabetes.   Patient was: Appointment scheduled with primary care provider in the next 30 days.  10/10/24

## 2024-09-23 MED ORDER — SEMAGLUTIDE (1 MG/DOSE) 4 MG/3ML ~~LOC~~ SOPN: 1.0000 mg | PEN_INJECTOR | SUBCUTANEOUS | 0 refills | Status: DC

## 2024-09-23 NOTE — Addendum Note (Signed)
 Addended by: LIONELL JON DEL on: 09/23/2024 08:35 AM   Modules accepted: Orders

## 2024-09-30 ENCOUNTER — Telehealth: Payer: Self-pay | Admitting: Pharmacy Technician

## 2024-09-30 NOTE — Progress Notes (Signed)
   09/30/2024 Name: Bailey [name] MRN: [id number] DOB: September 23, 1956  Patient is appearing on a report for [employer] [place] Metric Diabetes and last engaged with the clinical pharmacist to discuss diabetes on 09/19/2024. Contacted patient today to discuss diabetes management and completed medication review.   Diabetes Plan from last clinical pharmacist appointment:  Diabetes: - Currently uncontrolled but improving - Reviewed long term cardiovascular and renal outcomes of uncontrolled blood sugar - Reviewed goal A1c, goal fasting, and goal 2 hour post prandial glucose - Reviewed dietary modifications including low carb diet -Continue Lantus  30 units in am, 12 units in pm. Consider dose increase to Ozempic  1mg , will discuss with PCP - Patient denies personal or family history of multiple endocrine neoplasia type 2, medullary thyroid  cancer; personal history of pancreatitis or gallbladder disease. -Reach out sooner than scheduled follow up if BG is going above 350 or you begin to have low BG  Follow Up Plan: 11/07/24 (sees PCP in Oct)(copy/paste from last note)   Medication Adherence Barriers Identified:  Patient made recommended medication changes per plan: Yes Patient informs she increased Ozempic  dose to 1mg  weekly. She informs she picked up the medication but has not started it yet. She informs first dose is this Thursday 10/03/24. She informs she is also taking Jardiance  10mg  daily and Lantus  30 units in the morning and 12 units at night. She reports having these medications on hand reports no cost concerns at this time. Access issues with any new medication or testing device: No Per Dr Annemarie fill history data, Ozempic  was last filled on 10/2, Lantus  on 7/9 for 30ml or 85 day supply and Jardiance  on 8/19 for 90 tablets. Dexcom sensors also last sold on 09/05/24. Patient is checking blood sugars as prescribed: Yes She reports blood sugars are good She reports they are in the mid 100s. A1C on  04/24/24 was 8.9.  Medication Adherence Barriers Addressed/Actions Taken:  Reviewed medication changes per plan from last clinical pharmacist note Educated patient to contact pharmacy regarding new prescriptions Reviewed instructions for monitoring blood sugars at home and reminded patient to keep a written log to review with pharmacist Reminded patient of date/time of upcoming clinical pharmacist follow up and any upcoming PCP/specialists visits. Patient denies transportation barriers to the appointment. Yes  Next clinical pharmacist appointment is scheduled for: 11/07/2024   Kate Caddy, CPhT [facility] Office: [phone] Email: [email or web address]

## 2024-10-02 ENCOUNTER — Telehealth: Admitting: Physician Assistant

## 2024-10-02 DIAGNOSIS — H109 Unspecified conjunctivitis: Secondary | ICD-10-CM

## 2024-10-02 MED ORDER — OFLOXACIN 0.3 % OP SOLN: 1.0000 [drp] | Freq: Four times a day (QID) | OPHTHALMIC | 0 refills | Status: AC

## 2024-10-02 NOTE — Progress Notes (Signed)

## 2024-10-10 ENCOUNTER — Other Ambulatory Visit: Payer: Self-pay | Admitting: Medical

## 2024-10-10 ENCOUNTER — Ambulatory Visit (INDEPENDENT_AMBULATORY_CARE_PROVIDER_SITE_OTHER): Payer: 59 | Admitting: Medical

## 2024-10-10 VITALS — BP 110/64 | HR 63 | Ht 61.5 in | Wt 132.2 lb

## 2024-10-10 DIAGNOSIS — E782 Mixed hyperlipidemia: Secondary | ICD-10-CM | POA: Diagnosis not present

## 2024-10-10 DIAGNOSIS — L659 Nonscarring hair loss, unspecified: Secondary | ICD-10-CM

## 2024-10-10 DIAGNOSIS — Z90710 Acquired absence of both cervix and uterus: Secondary | ICD-10-CM | POA: Diagnosis not present

## 2024-10-10 DIAGNOSIS — E1169 Type 2 diabetes mellitus with other specified complication: Secondary | ICD-10-CM

## 2024-10-10 DIAGNOSIS — D696 Thrombocytopenia, unspecified: Secondary | ICD-10-CM

## 2024-10-10 DIAGNOSIS — Z0000 Encounter for general adult medical examination without abnormal findings: Secondary | ICD-10-CM

## 2024-10-10 DIAGNOSIS — E1165 Type 2 diabetes mellitus with hyperglycemia: Secondary | ICD-10-CM

## 2024-10-10 DIAGNOSIS — R140 Abdominal distension (gaseous): Secondary | ICD-10-CM

## 2024-10-10 DIAGNOSIS — M8580 Other specified disorders of bone density and structure, unspecified site: Secondary | ICD-10-CM

## 2024-10-10 DIAGNOSIS — F17200 Nicotine dependence, unspecified, uncomplicated: Secondary | ICD-10-CM | POA: Diagnosis not present

## 2024-10-10 DIAGNOSIS — Z90721 Acquired absence of ovaries, unilateral: Secondary | ICD-10-CM

## 2024-10-10 DIAGNOSIS — M48062 Spinal stenosis, lumbar region with neurogenic claudication: Secondary | ICD-10-CM | POA: Diagnosis not present

## 2024-10-10 DIAGNOSIS — H109 Unspecified conjunctivitis: Secondary | ICD-10-CM

## 2024-10-10 DIAGNOSIS — Z1231 Encounter for screening mammogram for malignant neoplasm of breast: Secondary | ICD-10-CM

## 2024-10-10 DIAGNOSIS — R928 Other abnormal and inconclusive findings on diagnostic imaging of breast: Secondary | ICD-10-CM | POA: Diagnosis not present

## 2024-10-10 DIAGNOSIS — T50905D Adverse effect of unspecified drugs, medicaments and biological substances, subsequent encounter: Secondary | ICD-10-CM

## 2024-10-10 MED ORDER — OLOPATADINE HCL 0.2 % OP SOLN: 1.0000 [drp] | Freq: Every day | OPHTHALMIC | 2 refills | Status: AC

## 2024-10-10 MED ORDER — OFLOXACIN 0.3 % OP SOLN: 1.0000 [drp] | Freq: Four times a day (QID) | OPHTHALMIC | 0 refills | Status: DC

## 2024-10-10 NOTE — Telephone Encounter (Signed)
 Pt here today for a visit

## 2024-10-10 NOTE — Progress Notes (Signed)
 Subjective:   HPI  Bailey [name] is a 68 y.o. female who presents for Chief Complaint  Patient presents with   Annual Exam    Fasting cpe, left ear pain x 1 ear, stomach feels bloated- due to ozempic  1mg  but also making sugar drop lower.  Sugar goes up when she showers. Hair thinning on the top of her head, and had pink eye on 10/8 and needs a refill on her eye drops    Patient Care Team: Monti Villers, Alm RAMAN, PA-C as PCP - General (Family Medicine) Patrcia Sharper, MD as Consulting Physician (Ophthalmology) Levern Hutching, MD as Consulting Physician (Cardiology) Lionell Jon DEL, [facility] (Pharmacist) Dr. Gustav Pennant, GI   Concerns: Bailey [name] is a 68 year old female who presents for an annual physical exam.  She has been experiencing a left earache and throat pain for a week. The earache radiates downwards, and she has been using Tylenol  for relief, though she is cautious about overuse. No cough, runny nose, or sneezing is associated with these symptoms.  She developed conjunctivitis, initially in one eye, and was prescribed eye drops through telehealth.. The redness improved, but the condition spread to the right eye, leading her to use the drops in both eyes.  She has noticed hair thinning at the top of her head for about a month. She is unsure of the cause and is not taking any supplements or treatments for it.  She experienced bloating and a hard stomach after increasing her medication dose of Ozempic  to 1 mg, which led her to revert to a lower dose. The bloating was significant enough to require wearing sweat pants instead of jeans. She stopped the higher dose a week ago and reports improvement in symptoms.  Hyperlipidemia-she continues with Lipitor 40 mg daily  Diabetes-she continues with Jardiance  10 mg daily, Lantus  30 units in the morning, 12 units in the evening.  Average blood sugars on her Dexcom device shows 180 average blood sugars for the  last 7, 14 and 30 days.  Using gabapentin  300 mg at bedtime as needed, not daily   Reviewed their medical, surgical, family, social, medication, and allergy history and updated chart as appropriate.  Allergies  Allergen Reactions   Metformin  Diarrhea   Metformin  And Related Diarrhea   Shellfish Allergy Anaphylaxis, Hives and Swelling    Lobster, crab (shrimp's ok)   Iodinated Contrast Media Hives    Other Reaction(s): Not available, Not available, Not available, Not available   Adhesive [Tape] Other (See Comments)    blisters   Chantix [Varenicline] Other (See Comments)    depression   Codeine Other (See Comments)    Unknown childhood allergy   Diphenhydramine Other (See Comments)    Causes drunk feeling I can't function for days/sleepiness   Hydrocodone -Acetaminophen     Hydrocodone -Acetaminophen  Nausea And Vomiting and Diarrhea   Penicillin G    Varenicline Tartrate Other (See Comments)   Latex Other (See Comments) and Itching    blisters   Penicillins Other (See Comments)    Unknown childhood reaction    Past Medical History:  Diagnosis Date   Arthritis    generalized   Cataract    LEFT eye   Cervical radiculopathy at C5    Dupuytren's contracture of both hands    L > R   Dyslipidemia    History of adenomatous polyp of colon    1997 and 2003     History of chronic pelvic inflammatory disease  History of gastric ulcer    2008   Hypertriglyceridemia    on meds   Osteoporosis    Right carpal tunnel syndrome    Seasonal allergies    Type 2 diabetes mellitus, uncontrolled    last A1c 8.9 on 07-07-2016   Vitamin D  deficiency       Current Outpatient Medications:    atorvastatin  (LIPITOR) 40 MG tablet, Take 1 tablet (40 mg total) by mouth daily., Disp: 90 tablet, Rfl: 3   CALCIUM  PO, Take 600 mg by mouth daily., Disp: , Rfl:    ciclopirox  (PENLAC ) 8 % solution, Apply topically at bedtime. Apply over nail and surrounding skin. Apply daily over previous  coat. After seven (7) days, may remove with alcohol and continue cycle., Disp: 6.6 mL, Rfl: 5   empagliflozin  (JARDIANCE ) 10 MG TABS tablet, Take 1 tablet (10 mg total) by mouth daily., Disp: 90 tablet, Rfl: 3   gabapentin  (NEURONTIN ) 300 MG capsule, Take 1 capsule (300 mg total) by mouth at bedtime as needed. TAKE 1 CAPSULE(300 MG) BY MOUTH AT BEDTIME, Disp: 90 capsule, Rfl: 3   Multiple Vitamins-Minerals (MULTIVITAMIN WITH MINERALS) tablet, Take 1 tablet by mouth every other day. , Disp: , Rfl:    Olopatadine HCl 0.2 % SOLN, Apply 1 drop to eye daily., Disp: 2.5 mL, Rfl: 2   venlafaxine  (EFFEXOR ) 25 MG tablet, TAKE 1 TABLET BY MOUTH TWICE A DAY, Disp: 180 tablet, Rfl: 0   Vitamin D , Cholecalciferol, 25 MCG (1000 UT) CAPS, Take 1,000 Int'l Units by mouth daily., Disp: , Rfl:    Continuous Blood Gluc Receiver (DEXCOM G7 RECEIVER) DEVI, UAD, Disp: 1 each, Rfl: 0   Continuous Glucose Sensor (DEXCOM G7 SENSOR) MISC, USE TO CHECK BLOOD SUGAR THREE TIMES DAILY. CHANGE SENSORS ONCE EVERY 10 DAYS., Disp: 3 each, Rfl: 5   Glucagon , rDNA, (GLUCAGON  EMERGENCY) 1 MG KIT, Inj 1 mg IM as needed for significant low blood sugar episode (Patient not taking: Reported on 08/12/2024), Disp: 1 kit, Rfl: 0   glucose blood test strip, 2 (two) times daily. (Patient not taking: Reported on 08/12/2024), Disp: , Rfl:    Insulin  Pen Needle (BD PEN NEEDLE NANO U/F) 32G X 4 MM MISC, Use once a day with insulin  pen (Patient not taking: Reported on 08/12/2024), Disp: 100 each, Rfl: 6   LANTUS  SOLOSTAR 100 UNIT/ML Solostar Pen, Inject 35 Units into the skin at bedtime., Disp: 30 mL, Rfl: 2   ofloxacin (OCUFLOX) 0.3 % ophthalmic solution, Place 1 drop into both eyes 4 (four) times daily., Disp: 10 mL, Rfl: 0  Family History  Problem Relation Age of Onset   Diabetes Mother    Hypertension Mother    Dementia Mother    Pancreatitis Mother    Asthma Mother    COPD Mother    Stroke Mother    Hyperlipidemia Mother    Heart disease  Mother    Kidney disease Mother    Miscarriages / [place] Mother    Vision loss Mother    Hypertension Father    Colon polyps Sister 73   Hypertension Sister    Diabetes Sister    Hyperlipidemia Sister    Hyperlipidemia Sister    Hypertension Sister    Diabetes Sister    Colon polyps Brother 52   Hypertension Brother    Diabetes Brother    Drug abuse Brother    Hyperlipidemia Brother    Colon cancer Brother 12   Hyperlipidemia Brother    Hypertension  Brother    Colon polyps Maternal Grandmother    Diabetes Maternal Grandmother    Colon cancer Maternal Grandmother    Hyperlipidemia Brother    Breast cancer Neg Hx    Esophageal cancer Neg Hx    Rectal cancer Neg Hx    Stomach cancer Neg Hx     Past Surgical History:  Procedure Laterality Date   CATARACT EXTRACTION Left 05/04/2023   COLONOSCOPY  2019   KN-MAC-2 day Plenvu(exc)-tics/TA-5 yr recall   DUPUYTREN / PALMAR FASCIOTOMY Bilateral    2023   ESOPHAGOGASTRODUODENOSCOPY  last one 2008   FASCIECTOMY Left 08/24/2016   Procedure: LEFT HAND PALMER AND SMALL FINGER DIGiTAL FASCIECTOMY AND RELEASE;  Surgeon: Prentice Pagan, MD;  Location: [facility];  Service: Orthopedics;  Laterality: Left;   LAPAROSCOPIC CHOLECYSTECTOMY  07/17/2000   ROTATOR CUFF REPAIR Right 2000   TOTAL ABDOMINAL HYSTERECTOMY W/ BILATERAL SALPINGOOPHORECTOMY  1998      Review of Systems  Constitutional:  Negative for chills, fever, malaise/fatigue and weight loss.  HENT:  Positive for ear pain and sore throat. Negative for congestion, hearing loss and tinnitus.   Eyes:  Positive for redness. Negative for blurred vision and pain.  Respiratory:  Negative for cough, hemoptysis and shortness of breath.   Cardiovascular:  Negative for chest pain, palpitations, orthopnea, claudication and leg swelling.  Gastrointestinal:  Positive for abdominal pain. Negative for blood in stool, constipation, diarrhea, nausea and vomiting.   Genitourinary:  Negative for dysuria, flank pain, frequency, hematuria and urgency.  Musculoskeletal:  Negative for falls, joint pain and myalgias.  Skin:  Negative for itching and rash.  Neurological:  Negative for dizziness, tingling, speech change, weakness and headaches.  Endo/Heme/Allergies:  Negative for polydipsia. Does not bruise/bleed easily.  Psychiatric/Behavioral:  Negative for depression and memory loss. The patient is not nervous/anxious and does not have insomnia.         Objective:  BP 110/64   Pulse 63   Ht 5' 1.5 (1.562 m)   Wt 132 lb 3.2 oz (60 kg)   SpO2 97%   BMI 24.57 kg/m   General appearance: alert, no distress, WD/WN, African American female Skin: Generalized hair thinning, no distinct round spots of alopecia.  No other worrisome lesions HEENT: Only mild pinkish-reddish coloration of conjunctiva, no watery drainage, no abnormality,  normocephalic, conjunctiva/corneas normal, sclerae anicteric, PERRLA, EOMi, nares patent, no discharge or erythema, pharynx normal Oral cavity: MMM, tongue normal, teeth normal Neck: supple, no lymphadenopathy, no thyromegaly, no masses, normal ROM, no bruits Chest: non tender, normal shape and expansion Heart: RRR, normal S1, S2, no murmurs Lungs: CTA bilaterally, no wheezes, rhonchi, or rales Abdomen: +bs, soft, non tender, non distended, no masses, no hepatomegaly, no splenomegaly, no bruits Back: non tender, normal ROM, no scoliosis Musculoskeletal: upper extremities non tender, no obvious deformity, normal ROM throughout, lower extremities non tender, no obvious deformity, normal ROM throughout Extremities: no edema, no cyanosis, no clubbing Pulses: 2+ symmetric, upper and 1+ lower extremities, normal cap refill.  Of note her right radial pulses palpated more look laterally than typical Neurological: alert, oriented x 3, CN2-12 intact, strength normal upper extremities and lower extremities, sensation normal throughout,  DTRs 2+ throughout, no cerebellar signs, gait normal Psychiatric: normal affect, behavior normal, pleasant  Breast/gyn/rectal - deferred to gynecology   Diabetic Foot Exam - Simple   Simple Foot Form Diabetic Foot exam was performed with the following findings: Yes 10/10/2024  9:43 AM  Visual Inspection See  comments: Yes Sensation Testing Intact to touch and monofilament testing bilaterally: Yes Pulse Check See comments: Yes Comments Hypertrophic toenails in general, left foot lesion, 1+ pulses       Assessment and Plan :   Encounter Diagnoses  Name Primary?   Encounter for health maintenance examination in adult Yes   Uncontrolled type 2 diabetes mellitus with hyperglycemia (HCC)    Osteopenia, unspecified location    S/P hysterectomy with oophorectomy    Spinal stenosis of lumbar region with neurogenic claudication    Thrombocytopenia    Tobacco dependence    Dyslipidemia with low high density lipoprotein (HDL) cholesterol with hypertriglyceridemia due to type 2 diabetes mellitus (HCC)    Hair thinning    Bloating    Conjunctivitis, unspecified conjunctivitis type, unspecified laterality    Adverse effect of drug, subsequent encounter    Abnormality of both breasts on screening mammogram    Encounter for screening mammogram for malignant neoplasm of breast      This visit was a preventative care visit, also known as wellness visit or routine physical.   Topics typically include healthy lifestyle, diet, exercise, preventative care, vaccinations, sick and well care, proper use of emergency dept and after hours care, as well as other concerns.    Separate significant issues discussed: Left ear pain and sore throat Left ear pain and sore throat for one week without respiratory symptoms. Managed with Tylenol . - Continue Tylenol  as needed for pain. -begin over the counter allergy medication such as zyrtec  or allergra at bedtime for the next month as this may be more allergy  related.  No obvious infection on exam  Bilateral conjunctivitis Improvement with prescribed eye drops. Refill requested. - Refill eye drops for bilateral use if pus, lots of redness, but no obvious infection today -begin Pataday allergy drops daily for the next month  Type 2 diabetes mellitus with hyperglycemia -labs today -go back down to ozempic  0.5mg  weekl since you didn't tolerate 1mg  weekly -continue Lantus  -we will call with results.  Hyperlipidemia CT coronary test shows cholesterol plaque in the aorta with a score of 37 -continue Atorvastsatin Lipitor 40mg  daily  Low bone mass Recent bone density test indicates low bone mass but not osteoporosis. -continue vitamin D  supplement -use calcium  supplement 1200mg  daily  Hair thinning Hair thinning localized to the top of the head, noticed about a month ago. Concern about potential medication side effects. - Recommend supplements for hair health. -begin supplement Biotin over the counter daily -begin Collagen supplement over the counter daily -consider over the counter Women's Rogaine topical  Tobacco use  - I strongly recommend you quit tobacco    General Recommendations: Continue to return yearly for your annual wellness and preventative care visits.  This gives us  a chance to discuss healthy lifestyle, exercise, vaccinations, review your chart record, and perform screenings where appropriate.  I recommend you see your eye doctor yearly for routine vision care.  I recommend you see your dentist yearly for routine dental care including hygiene visits twice yearly.   Vaccination recommendations were reviewed Immunization History  Administered Date(s) Administered    sv, Bivalent, Protein Subunit Rsvpref,pf (Abrysvo) 09/26/2024   Fluad Trivalent(High Dose 65+) 09/26/2024   INFLUENZA, HIGH DOSE SEASONAL PF 09/09/2023   Influenza, Seasonal, Injecte, Preservative Fre 01/04/2013   Influenza,inj,Quad PF,6+ Mos 12/05/2016,  09/17/2019   Influenza,inj,quad, With Preservative 09/25/2020   Influenza-Unspecified 09/14/2017, 09/25/2020, 10/17/2021   PFIZER(Purple Top)SARS-COV-2 Vaccination 03/26/2020, 04/20/2020, 12/15/2020   PPD Test 04/10/2018  Pneumococcal Conjugate-13 12/13/2019   Pneumococcal Polysaccharide-23 12/05/2016, 02/08/2022   Tdap 02/19/2018   Zoster Recombinant(Shingrix) 07/09/2022     Screening for cancer: Colon cancer screening: Prior or last colon cancer screen: 08/2023, repeat 5 years   Breast cancer screening: You should perform a self breast exam monthly.   We reviewed recommendations for regular mammograms and breast cancer screening. Last mammogram: due for diagnostic mammogram 11/2024  Skin cancer screening: Check your skin regularly for new changes, growing lesions, or other lesions of concern Come in for evaluation if you have skin lesions of concern.  Lung cancer screening: If you have a greater than 20 pack year history of tobacco use, then you may qualify for lung cancer screening with a chest CT scan.   Please call your insurance company to inquire about coverage for this test.  Pancreatic cancer: no current screening test is available routinely recommended.  (Risk factors: Smoking, overweight or obese, diabetes, chronic pancreatitis, work Nurse, mental health, Solicitor, 98 year old or greater, female greater than female, African-American, family history of pancreatic cancer, hereditary breast, ovarian, melanoma, Lynch, Peutz-jeghers).  We currently don't have screenings for other cancers besides breast, cervical, colon, and lung cancers.  If you have a strong family history of cancer or have other cancer screening concerns, please let me know.    Bone health: Get at least 150 minutes of aerobic exercise weekly Get weight bearing exercise at least once weekly Bone density test:  A bone density test is an imaging test that uses a type of X-ray to measure the amount of  calcium  and other minerals in your bones. The test may be used to diagnose or screen you for a condition that causes weak or thin bones (osteoporosis), predict your risk for a broken bone (fracture), or determine how well your osteoporosis treatment is working. The bone density test is recommended for females 65 and older, or females or males <65 if certain risk factors such as thyroid  disease, long term use of steroids such as for asthma or rheumatological issues, vitamin D  deficiency, estrogen deficiency, family history of osteoporosis, self or family history of fragility fracture in first degree relative.  Bone density test 07/2024 with osteopenia/low bone mass    Heart health: Get at least 150 minutes of aerobic exercise weekly Limit alcohol It is important to maintain a healthy blood pressure and healthy cholesterol numbers  Heart disease screening: Screening for heart disease includes screening for blood pressure, fasting lipids, glucose/diabetes screening, BMI height to weight ratio, reviewed of smoking status, physical activity, and diet.    Goals include blood pressure 120/80 or less, maintaining a healthy lipid/cholesterol profile, preventing diabetes or keeping diabetes numbers under good control, not smoking or using tobacco products, exercising most days per week or at least 150 minutes per week of exercise, and eating healthy variety of fruits and vegetables, healthy oils, and avoiding unhealthy food choices like fried food, fast food, high sugar and high cholesterol foods.    Other tests may possibly include EKG test, CT coronary calcium  score, echocardiogram, exercise treadmill stress test.    CT coronary calcium  test 09/2023 with score of 37.4 and aortic atherosclerosis seen   Vascular disease screening: For high risk individuals including smokers, diabetes, patients with known heart disease or high blood pressure, kidney disease, and others, screening for vascular disease or  atherosclerosis of the arteries is available.  Examples may include carotid ultrasound, abdominal aortic ultrasound, ABI blood flow screening in the legs, thoracic aorta screening.  ABI 09/2023  normal   Medical care options: I recommend you continue to seek care here first for routine care.  We try really hard to have available appointments Monday through Friday daytime hours for sick visits, acute visits, and physicals.  Urgent care should be used for after hours and weekends for significant issues that cannot wait till the next day.  The emergency department should be used for significant potentially life-threatening emergencies.  The emergency department is expensive, can often have long wait times for less significant concerns, so try to utilize primary care, urgent care, or telemedicine when possible to avoid unnecessary trips to the emergency department.  Virtual visits and telemedicine have been introduced since the pandemic started in 2020, and can be convenient ways to receive medical care.  We offer virtual appointments as well to assist you in a variety of options to seek medical care.   Legal Take the time to do a Last Will and Testament, advanced directives including Healthcare Power of Attorney and Living Will documents.  Do not leave your family with burdens that can be handled ahead of time.   Advanced Directives: I recommend you consider completing a Health Care Power of Attorney and Living Will.   These documents respect your wishes and help alleviate burdens on your loved ones if you were to become terminally ill or be in a position to need those documents enforced.    You can complete Advanced Directives yourself, have them notarized, then have copies made for our office, for you and for anybody you feel should have them in safe keeping.  Or, you can have an attorney prepare these documents.   If you haven't updated your Last Will and Testament in a while, it may be worthwhile  having an attorney prepare these documents together and save on some costs.          Johni was seen today for annual exam.  Diagnoses and all orders for this visit:  Encounter for health maintenance examination in adult -     Comprehensive metabolic panel with GFR -     Lipid panel -     TSH -     Hemoglobin A1c -     Microalbumin/Creatinine Ratio, Urine  Uncontrolled type 2 diabetes mellitus with hyperglycemia (HCC) -     Hemoglobin A1c -     Microalbumin/Creatinine Ratio, Urine  Osteopenia, unspecified location  S/P hysterectomy with oophorectomy  Spinal stenosis of lumbar region with neurogenic claudication  Thrombocytopenia  Tobacco dependence  Dyslipidemia with low high density lipoprotein (HDL) cholesterol with hypertriglyceridemia due to type 2 diabetes mellitus (HCC) -     Lipid panel  Hair thinning -     TSH  Bloating -     TSH  Conjunctivitis, unspecified conjunctivitis type, unspecified laterality  Adverse effect of drug, subsequent encounter  Abnormality of both breasts on screening mammogram -     MM 3D DIAGNOSTIC MAMMOGRAM BILATERAL BREAST  Encounter for screening mammogram for malignant neoplasm of breast -     MM 3D DIAGNOSTIC MAMMOGRAM BILATERAL BREAST  Other orders -     ofloxacin (OCUFLOX) 0.3 % ophthalmic solution; Place 1 drop into both eyes 4 (four) times daily. -     Olopatadine HCl 0.2 % SOLN; Apply 1 drop to eye daily.    Follow-up pending labs, yearly for physical

## 2024-10-10 NOTE — Patient Instructions (Signed)
 This visit was a preventative care visit, also known as wellness visit or routine physical.   Topics typically include healthy lifestyle, diet, exercise, preventative care, vaccinations, sick and well care, proper use of emergency dept and after hours care, as well as other concerns.    Separate significant issues discussed: Left ear pain and sore throat Left ear pain and sore throat for one week without respiratory symptoms. Managed with Tylenol . - Continue Tylenol  as needed for pain. -begin over the counter allergy medication such as zyrtec  or allergra at bedtime for the next month as this may be more allergy related.  No obvious infection on exam  Bilateral conjunctivitis Improvement with prescribed eye drops. Refill requested. - Refill eye drops for bilateral use if pus, lots of redness, but no obvious infection today -begin Pataday allergy drops daily for the next month  Type 2 diabetes mellitus with hyperglycemia -labs today -go back down to ozempic  0.5mg  weekl since you didn't tolerate 1mg  weekly -continue Lantus  -we will call with results.  Hyperlipidemia CT coronary test shows cholesterol plaque in the aorta with a score of 37 -continue Atorvastsatin Lipitor 40mg  daily  Low bone mass Recent bone density test indicates low bone mass but not osteoporosis. -continue vitamin D  supplement -use calcium  supplement 1200mg  daily  Hair thinning Hair thinning localized to the top of the head, noticed about a month ago. Concern about potential medication side effects. - Recommend supplements for hair health. -begin supplement Biotin over the counter daily -begin Collagen supplement over the counter daily -consider over the counter Women's Rogaine topical  Tobacco use  - I strongly recommend you quit tobacco    General Recommendations: Continue to return yearly for your annual wellness and preventative care visits.  This gives us  a chance to discuss healthy lifestyle, exercise,  vaccinations, review your chart record, and perform screenings where appropriate.  I recommend you see your eye doctor yearly for routine vision care.  I recommend you see your dentist yearly for routine dental care including hygiene visits twice yearly.   Vaccination recommendations were reviewed Immunization History  Administered Date(s) Administered    sv, Bivalent, Protein Subunit Rsvpref,pf (Abrysvo) 09/26/2024   Fluad Trivalent(High Dose 65+) 09/26/2024   INFLUENZA, HIGH DOSE SEASONAL PF 09/09/2023   Influenza, Seasonal, Injecte, Preservative Fre 01/04/2013   Influenza,inj,Quad PF,6+ Mos 12/05/2016, 09/17/2019   Influenza,inj,quad, With Preservative 09/25/2020   Influenza-Unspecified 09/14/2017, 09/25/2020, 10/17/2021   PFIZER(Purple Top)SARS-COV-2 Vaccination 03/26/2020, 04/20/2020, 12/15/2020   PPD Test 04/10/2018   Pneumococcal Conjugate-13 12/13/2019   Pneumococcal Polysaccharide-23 12/05/2016, 02/08/2022   Tdap 02/19/2018   Zoster Recombinant(Shingrix) 07/09/2022     Screening for cancer: Colon cancer screening: Prior or last colon cancer screen: 08/2023, repeat 5 years   Breast cancer screening: You should perform a self breast exam monthly.   We reviewed recommendations for regular mammograms and breast cancer screening. Last mammogram: due for diagnostic mammogram 11/2024  Skin cancer screening: Check your skin regularly for new changes, growing lesions, or other lesions of concern Come in for evaluation if you have skin lesions of concern.  Lung cancer screening: If you have a greater than 20 pack year history of tobacco use, then you may qualify for lung cancer screening with a chest CT scan.   Please call your insurance company to inquire about coverage for this test.  Pancreatic cancer: no current screening test is available routinely recommended.  (Risk factors: Smoking, overweight or obese, diabetes, chronic pancreatitis, work Nurse, mental health,  Solicitor, 31 year old or greater,  female greater than female, African-American, family history of pancreatic cancer, hereditary breast, ovarian, melanoma, Lynch, Peutz-jeghers).  We currently don't have screenings for other cancers besides breast, cervical, colon, and lung cancers.  If you have a strong family history of cancer or have other cancer screening concerns, please let me know.    Bone health: Get at least 150 minutes of aerobic exercise weekly Get weight bearing exercise at least once weekly Bone density test:  A bone density test is an imaging test that uses a type of X-ray to measure the amount of calcium  and other minerals in your bones. The test may be used to diagnose or screen you for a condition that causes weak or thin bones (osteoporosis), predict your risk for a broken bone (fracture), or determine how well your osteoporosis treatment is working. The bone density test is recommended for females 65 and older, or females or males <65 if certain risk factors such as thyroid  disease, long term use of steroids such as for asthma or rheumatological issues, vitamin D  deficiency, estrogen deficiency, family history of osteoporosis, self or family history of fragility fracture in first degree relative.  Bone density test 07/2024 with osteopenia/low bone mass    Heart health: Get at least 150 minutes of aerobic exercise weekly Limit alcohol It is important to maintain a healthy blood pressure and healthy cholesterol numbers  Heart disease screening: Screening for heart disease includes screening for blood pressure, fasting lipids, glucose/diabetes screening, BMI height to weight ratio, reviewed of smoking status, physical activity, and diet.    Goals include blood pressure 120/80 or less, maintaining a healthy lipid/cholesterol profile, preventing diabetes or keeping diabetes numbers under good control, not smoking or using tobacco products, exercising most days per week or at  least 150 minutes per week of exercise, and eating healthy variety of fruits and vegetables, healthy oils, and avoiding unhealthy food choices like fried food, fast food, high sugar and high cholesterol foods.    Other tests may possibly include EKG test, CT coronary calcium  score, echocardiogram, exercise treadmill stress test.    CT coronary calcium  test 09/2023 with score of 37.4 and aortic atherosclerosis seen   Vascular disease screening: For high risk individuals including smokers, diabetes, patients with known heart disease or high blood pressure, kidney disease, and others, screening for vascular disease or atherosclerosis of the arteries is available.  Examples may include carotid ultrasound, abdominal aortic ultrasound, ABI blood flow screening in the legs, thoracic aorta screening.  ABI 09/2023 normal   Medical care options: I recommend you continue to seek care here first for routine care.  We try really hard to have available appointments Monday through Friday daytime hours for sick visits, acute visits, and physicals.  Urgent care should be used for after hours and weekends for significant issues that cannot wait till the next day.  The emergency department should be used for significant potentially life-threatening emergencies.  The emergency department is expensive, can often have long wait times for less significant concerns, so try to utilize primary care, urgent care, or telemedicine when possible to avoid unnecessary trips to the emergency department.  Virtual visits and telemedicine have been introduced since the pandemic started in 2020, and can be convenient ways to receive medical care.  We offer virtual appointments as well to assist you in a variety of options to seek medical care.   Legal Take the time to do a Last Will and Testament, advanced directives including Healthcare Power of [place] and  Living Will documents.  Do not leave your family with burdens that can be  handled ahead of time.   Advanced Directives: I recommend you consider completing a Health Care Power of Attorney and Living Will.   These documents respect your wishes and help alleviate burdens on your loved ones if you were to become terminally ill or be in a position to need those documents enforced.    You can complete Advanced Directives yourself, have them notarized, then have copies made for our office, for you and for anybody you feel should have them in safe keeping.  Or, you can have an attorney prepare these documents.   If you haven't updated your Last Will and Testament in a while, it may be worthwhile having an attorney prepare these documents together and save on some costs.

## 2024-10-11 ENCOUNTER — Ambulatory Visit: Payer: Self-pay | Admitting: Medical

## 2024-10-11 ENCOUNTER — Other Ambulatory Visit: Payer: Self-pay | Admitting: Medical

## 2024-10-11 LAB — MICROALBUMIN / CREATININE URINE RATIO
Creatinine, Urine: 87.7 mg/dL
Microalb/Creat Ratio: <3 mg/g{creat} (ref 0–29)
Microalbumin, Urine: <3 ug/mL

## 2024-10-11 LAB — COMPREHENSIVE METABOLIC PANEL WITH GFR
ALT: 44 IU/L — ABNORMAL HIGH (ref 0–32)
AST: 28 IU/L (ref 0–40)
Albumin: 4.5 g/dL (ref 3.9–4.9)
Alkaline Phosphatase: 91 IU/L (ref 49–135)
BUN/Creatinine Ratio: 18 (ref 12–28)
BUN: 13 mg/dL (ref 8–27)
Bilirubin Total: 0.4 mg/dL (ref 0.0–1.2)
CO2: 21 mmol/L (ref 20–29)
Calcium: 9.6 mg/dL (ref 8.7–10.3)
Chloride: 104 mmol/L (ref 96–106)
Creatinine, Ser: 0.74 mg/dL (ref 0.57–1.00)
Globulin, Total: 2.3 g/dL (ref 1.5–4.5)
Glucose: 152 mg/dL — ABNORMAL HIGH (ref 70–99)
Potassium: 4.8 mmol/L (ref 3.5–5.2)
Sodium: 143 mmol/L (ref 134–144)
Total Protein: 6.8 g/dL (ref 6.0–8.5)
eGFR: 87 mL/min/1.73 (ref >59)

## 2024-10-11 LAB — LIPID PANEL
Chol/HDL Ratio: 2.6 ratio (ref 0.0–4.4)
Cholesterol, Total: 98 mg/dL — ABNORMAL LOW (ref 100–199)
HDL: 37 mg/dL — ABNORMAL LOW (ref >39)
LDL Chol Calc (NIH): 42 mg/dL (ref 0–99)
Triglycerides: 102 mg/dL (ref 0–149)
VLDL Cholesterol Cal: 19 mg/dL (ref 5–40)

## 2024-10-11 LAB — TSH: TSH: 2.31 u[IU]/mL (ref 0.450–4.500)

## 2024-10-11 LAB — HEMOGLOBIN A1C
Est. average glucose Bld gHb Est-mCnc: 223 mg/dL
Hgb A1c MFr Bld: 9.4 % — ABNORMAL HIGH (ref 4.8–5.6)

## 2024-10-11 MED ORDER — VENLAFAXINE HCL 25 MG PO TABS: 25.0000 mg | ORAL_TABLET | Freq: Two times a day (BID) | ORAL | 0 refills | Status: AC

## 2024-10-11 MED ORDER — LANTUS SOLOSTAR 100 UNIT/ML ~~LOC~~ SOPN: PEN_INJECTOR | SUBCUTANEOUS | 2 refills | Status: AC

## 2024-10-11 MED ORDER — SEMAGLUTIDE(0.25 OR 0.5MG/DOS) 2 MG/3ML ~~LOC~~ SOPN: 0.5000 mg | PEN_INJECTOR | SUBCUTANEOUS | 2 refills | Status: AC

## 2024-10-11 NOTE — Progress Notes (Signed)
 Results through MyChart

## 2024-10-16 NOTE — Progress Notes (Addendum)
 Bailey [name]                                          MRN: [id number]   10/16/2024   The [facility] Quality Team Specialist reviewed this patient medical record for the purposes of chart review for care gap closure. The following were reviewed: abstraction for care gap closure-kidney health evaluation for diabetes:eGFR  and uACR. CHART REVIEWED FOR GSD MEASURE. Checked A1c labs again.     [facility] Quality Team

## 2024-10-18 NOTE — Progress Notes (Signed)
   10/18/2024  Patient ID: Bailey [name], female   DOB: Oct 22, 1956, 68 y.o.   MRN: [id number]  Pharmacy Quality Measure Review  This patient is appearing on a report for being at risk of failing the adherence measure for cholesterol (statin) medications this calendar year.   Medication: Atorvastatin  Last fill date: 10/10/24 for 90 day supply  Insurance report was not up to date. No action needed at this time.   Jon VEAR Lindau, PharmD Clinical Pharmacist [phone]

## 2024-11-07 ENCOUNTER — Other Ambulatory Visit: Payer: Self-pay

## 2024-11-07 DIAGNOSIS — E1165 Type 2 diabetes mellitus with hyperglycemia: Secondary | ICD-10-CM

## 2024-11-07 NOTE — Progress Notes (Signed)
 11/07/2024 Name: Bailey [name] MRN: [id number] DOB: 04-30-1956  Chief Complaint  Patient presents with   Medication Management   Diabetes    Bailey [name] is a 68 y.o. year old female who presented for a telephone visit.   They were referred to the pharmacist by a quality report for assistance in managing diabetes.    Subjective:  Care Team: Primary Care Provider: Bulah Alm RAMAN, PA-C   Medication Access/Adherence  Current Pharmacy:  [phone] - [place], [place] - [address] AT [facility] OF [address] [place] [address] Phone: [phone] Fax: [phone]   Patient reports affordability concerns with their medications: No  Patient reports access/transportation concerns to their pharmacy: No  Patient reports adherence concerns with their medications:  No     Diabetes:  Current medications: Jardiance  10mg , Lantus  35 units in morning and 12 units at night, Ozempic  0.5mg  (restarted 08/29/24) Medications tried in the past: Glimepiride , Metformin , Januvia   Current glucose readings:     Observed patterns: GMI slowly improving. No low BG readings   Patient denies hypoglycemic s/sx including dizziness, shakiness, sweating. Patient denies hyperglycemic symptoms including polyuria, polydipsia, polyphagia, nocturia, neuropathy, blurred vision.    Objective:  Lab Results  Component Value Date   HGBA1C 9.4 (H) 10/10/2024    Lab Results  Component Value Date   CREATININE 0.74 10/10/2024   BUN 13 10/10/2024   NA 143 10/10/2024   K 4.8 10/10/2024   CL 104 10/10/2024   CO2 21 10/10/2024    Lab Results  Component Value Date   CHOL 98 (L) 10/10/2024   HDL 37 (L) 10/10/2024   LDLCALC 42 10/10/2024   LDLDIRECT 45 06/10/2016   TRIG 102 10/10/2024   CHOLHDL 2.6 10/10/2024    Medications Reviewed Today     Reviewed by Bailey [name] (Pharmacist) on 11/07/24 at 1522  Med  List Status: <None>   Medication Order Taking? Sig Documenting Provider Last Dose Status Informant  atorvastatin  (LIPITOR) 40 MG tablet [id number]  TAKE 1 TABLET BY MOUTH EVERY DAY Bailey [name]  Active   CALCIUM  PO [id number]  Take 600 mg by mouth daily. [provider]  Active   ciclopirox  (PENLAC ) 8 % solution [id number]  Apply topically at bedtime. Apply over nail and surrounding skin. Apply daily over previous coat. After seven (7) days, may remove with alcohol and continue cycle. Tysinger, Alm RAMAN, PA-C  Active            Med Note BEVERLEE LUCIENNE JULIANNA Pablo Aug 12, 2024  1:29 PM) As needed  Continuous Blood Gluc Receiver [facility] G7 [place]) [place] [phone]  UAD Bailey Barnie NOVAK, MD  Active   Continuous Glucose Sensor (DEXCOM G7 SENSOR) [place] [id number]  USE TO CHECK BLOOD SUGAR THREE TIMES DAILY. CHANGE SENSORS ONCE EVERY 10 DAYS. Tysinger, Alm RAMAN, PA-C  Active   empagliflozin  (JARDIANCE ) 10 MG TABS tablet [id number]  Take 1 tablet (10 mg total) by mouth daily. Tysinger, Alm RAMAN, PA-C  Active   gabapentin  (NEURONTIN ) 300 MG capsule [id number]  Take 1 capsule (300 mg total) by mouth at bedtime as needed. TAKE 1 CAPSULE(300 MG) BY MOUTH AT BEDTIME Tysinger, Alm RAMAN, PA-C  Active            Med Note BEVERLEE, LUCIENNE JULIANNA Pablo Aug 12, 2024  1:30 PM) As needed  Glucagon , rDNA, (GLUCAGON  EMERGENCY) 1 MG KIT [id number]  Inj 1 mg  IM as needed for significant low blood sugar episode  Patient not taking: Reported on 08/12/2024   Bailey Alm RAMAN, PA-C  Active   glucose blood test strip [id number]  2 (two) times daily.  Patient not taking: Reported on 08/12/2024   [provider]  Active            Med Note MELANEE, ERICA M   Mon Jun 03, 2024  9:15 AM) Only if the Dexcon goes out   Insulin  Pen Needle (BD PEN NEEDLE NANO U/F) 32G X 4 MM MISC [id number]  Use once a day with insulin  pen  Patient not taking: Reported on 08/12/2024   Bailey Alm RAMAN, PA-C  Active   LANTUS  SOLOSTAR 100  UNIT/ML Solostar Pen [id number]  30 units morning, 15 units QHS Tysinger, Alm RAMAN, PA-C  Active   Multiple Vitamins-Minerals (MULTIVITAMIN WITH MINERALS) tablet [id number]  Take 1 tablet by mouth every other day.  [provider]  Active Self  ofloxacin (OCUFLOX) 0.3 % ophthalmic solution [id number]  Place 1 drop into both eyes 4 (four) times daily. Bailey Alm RAMAN, PA-C  Active   Olopatadine HCl 0.2 % SOLN [id number]  Apply 1 drop to eye daily. Tysinger, Alm RAMAN, PA-C  Active   Semaglutide ,0.25 or 0.5MG /DOS, 2 MG/3ML NELMA [id number] Yes Inject 0.5 mg into the skin once a week. Tysinger, Alm RAMAN, PA-C  Active   venlafaxine  (EFFEXOR ) 25 MG tablet [id number]  Take 1 tablet (25 mg total) by mouth 2 (two) times daily. Tysinger, Alm RAMAN, PA-C  Active   Vitamin D , Cholecalciferol, 25 MCG (1000 UT) CAPS [id number]  Take 1,000 Int'l Units by mouth daily. [provider]  Active               Assessment/Plan:   Diabetes: - Currently uncontrolled but improving - Reviewed long term cardiovascular and renal outcomes of uncontrolled blood sugar - Reviewed goal A1c, goal fasting, and goal 2 hour post prandial glucose - Reviewed dietary modifications including low carb diet -Increase lantus  to 15 units at bedtime as previously recommended by PCP. - Patient denies personal or family history of multiple endocrine neoplasia type 2, medullary thyroid  cancer; personal history of pancreatitis or gallbladder disease. -Reach out sooner than scheduled follow up if BG is going above 350 or you begin to have low BG   Follow Up Plan: 1 week  Bailey [name], PharmD Clinical Pharmacist [phone]

## 2024-11-08 ENCOUNTER — Other Ambulatory Visit: Payer: Self-pay | Admitting: Medical

## 2024-11-14 ENCOUNTER — Other Ambulatory Visit

## 2024-11-14 DIAGNOSIS — E1165 Type 2 diabetes mellitus with hyperglycemia: Secondary | ICD-10-CM

## 2024-11-14 NOTE — Progress Notes (Signed)
 11/14/2024 Name: Bailey [name] MRN: [id number] DOB: 1955/12/29  Chief Complaint  Patient presents with   Medication Management   Diabetes    Bailey [name] is a 68 y.o. year old female who presented for a telephone visit.   They were referred to the pharmacist by a quality report for assistance in managing diabetes.    Subjective:  Care Team: Primary Care Provider: Bulah Alm RAMAN, PA-C   Medication Access/Adherence  [facility]:  [phone] [place], [place] - [address] AT [facility] OF [address] [place] [address] Phone: [phone] Fax: [phone]   Patient reports affordability concerns with their medications: No  Patient reports access/transportation concerns to their pharmacy: No  Patient reports adherence concerns with their medications:  No     Diabetes:  Current medications: Jardiance  10mg , Lantus  30 units in morning and 12 units at night, Ozempic  0.5mg   Medications tried in the past: Glimepiride , Metformin , Januvia   Current glucose readings:     Observed patterns: GMI slowly improving. No low BG readings   Patient denies hypoglycemic s/sx including dizziness, shakiness, sweating. Patient denies hyperglycemic symptoms including polyuria, polydipsia, polyphagia, nocturia, neuropathy, blurred vision.    Objective:  Lab Results  Component Value Date   HGBA1C 9.4 (H) 10/10/2024    Lab Results  Component Value Date   CREATININE 0.74 10/10/2024   BUN 13 10/10/2024   NA 143 10/10/2024   K 4.8 10/10/2024   CL 104 10/10/2024   CO2 21 10/10/2024    Lab Results  Component Value Date   CHOL 98 (L) 10/10/2024   HDL 37 (L) 10/10/2024   LDLCALC 42 10/10/2024   LDLDIRECT 45 06/10/2016   TRIG 102 10/10/2024   CHOLHDL 2.6 10/10/2024    Medications Reviewed Today     Reviewed by Lionell Jon DEL, RPH (Pharmacist) on 11/14/24 at 1044  Med List Status: <None>    Medication Order Taking? Sig Documenting Provider Last Dose Status Informant  atorvastatin  (LIPITOR) 40 MG tablet [id number]  TAKE 1 TABLET BY MOUTH EVERY DAY Bulah Alm RAMAN DEVONNA  Active   CALCIUM  PO [id number]  Take 600 mg by mouth daily. [provider]  Active   ciclopirox  (PENLAC ) 8 % solution [id number]  Apply topically at bedtime. Apply over nail and surrounding skin. Apply daily over previous coat. After seven (7) days, may remove with alcohol and continue cycle. Tysinger, Alm RAMAN, PA-C  Active            Med Note BEVERLEE LUCIENNE JULIANNA Pablo Aug 12, 2024  1:29 PM) As needed  Continuous Blood Gluc Receiver [facility] G7 [place]) [place] [phone]  UAD Vicci Barnie NOVAK, MD  Active   Continuous Glucose Sensor (DEXCOM G7 SENSOR) [place] [id number]  USE TO CHECK BLOOD SUGAR THREE TIMES DAILY. CHANGE SENSORS ONCE EVERY 10 DAYS. Tysinger, Alm RAMAN, PA-C  Active   gabapentin  (NEURONTIN ) 300 MG capsule [id number]  Take 1 capsule (300 mg total) by mouth at bedtime as needed. TAKE 1 CAPSULE(300 MG) BY MOUTH AT BEDTIME Tysinger, Alm RAMAN, PA-C  Active            Med Note BEVERLEE, LUCIENNE JULIANNA Pablo Aug 12, 2024  1:30 PM) As needed  Glucagon , rDNA, (GLUCAGON  EMERGENCY) 1 MG KIT [id number]  Inj 1 mg IM as needed for significant low blood sugar episode  Patient not taking: Reported on 08/12/2024   Bulah Alm RAMAN, PA-C  Active  glucose blood test strip [id number]  2 (two) times daily.  Patient not taking: Reported on 08/12/2024   [provider]  Active            Med Note MELANEE, ERICA M   Mon Jun 03, 2024  9:15 AM) Only if the Dexcon goes out   Insulin  Pen Needle (BD PEN NEEDLE NANO U/F) 32G X 4 MM MISC [id number]  Use once a day with insulin  pen  Patient not taking: Reported on 08/12/2024   Bulah Alm RAMAN, PA-C  Active   JARDIANCE  10 MG TABS tablet [id number]  TAKE 1 TABLET BY MOUTH EVERY DAY Bulah Alm RAMAN, PA-C  Active   LANTUS  SOLOSTAR 100 UNIT/ML Solostar Pen [id number]  30 units morning,  15 units QHS Tysinger, Alm RAMAN, PA-C  Active   Multiple Vitamins-Minerals (MULTIVITAMIN WITH MINERALS) tablet [id number]  Take 1 tablet by mouth every other day.  [provider]  Active Self  ofloxacin (OCUFLOX) 0.3 % ophthalmic solution [id number]  Place 1 drop into both eyes 4 (four) times daily. Bulah Alm RAMAN, PA-C  Active   Olopatadine HCl 0.2 % SOLN [id number]  Apply 1 drop to eye daily. Tysinger, Alm RAMAN, PA-C  Active   Semaglutide ,0.25 or 0.5MG /DOS, 2 MG/3ML SOPN [id number]  Inject 0.5 mg into the skin once a week. Tysinger, Alm RAMAN, PA-C  Active   venlafaxine  (EFFEXOR ) 25 MG tablet [id number]  Take 1 tablet (25 mg total) by mouth 2 (two) times daily. Tysinger, Alm RAMAN, PA-C  Active   Vitamin D , Cholecalciferol, 25 MCG (1000 UT) CAPS [id number]  Take 1,000 Int'l Units by mouth daily. [provider]  Active               Assessment/Plan:   Diabetes: - Currently uncontrolled but improving - Reviewed long term cardiovascular and renal outcomes of uncontrolled blood sugar - Reviewed goal A1c, goal fasting, and goal 2 hour post prandial glucose - Reviewed dietary modifications including low carb diet -Increase lantus  32 units in morning, continue 15 units at bedtime - Patient denies personal or family history of multiple endocrine neoplasia type 2, medullary thyroid  cancer; personal history of pancreatitis or gallbladder disease. -Reach out sooner than scheduled follow up if BG is going above 350 or you begin to have low BG   Follow Up Plan: 4 weeks  Jon VEAR Lindau, PharmD Clinical Pharmacist [phone]

## 2024-11-28 ENCOUNTER — Inpatient Hospital Stay: Admission: RE | Admit: 2024-11-28 | Discharge: 2024-11-28 | Attending: Medical | Admitting: Medical

## 2024-12-02 ENCOUNTER — Other Ambulatory Visit

## 2024-12-13 ENCOUNTER — Other Ambulatory Visit: Payer: Self-pay | Admitting: Medical

## 2024-12-13 DIAGNOSIS — E1136 Type 2 diabetes mellitus with diabetic cataract: Secondary | ICD-10-CM

## 2024-12-27 ENCOUNTER — Other Ambulatory Visit: Payer: Self-pay | Admitting: Medical

## 2024-12-30 ENCOUNTER — Ambulatory Visit: Payer: Self-pay

## 2024-12-30 NOTE — Telephone Encounter (Signed)
 FYI Only or Action Required?: FYI only for provider: appointment scheduled on 12/31/2024.  Patient was last seen in primary care on 10/10/2024 by Bailey Alm RAMAN, PA-C.  Called Nurse Triage reporting Breast Pain.  Symptoms began several weeks ago.  Interventions attempted: OTC medications: aspirin and Ice/heat application.  Symptoms are: stable.  Triage Disposition: See PCP Within 2 Weeks  Patient/caregiver understands and will follow disposition?: Yes  Copied from CRM [id number]. Topic: Clinical - Red Word Triage >> Dec 30, 2024 10:05 AM Bailey [name] wrote: [facility] that prompted transfer to Nurse Triage: Patient is having pain on left breast on top side. Pain has had pain since weekend. Reason for Disposition  [1] Breast pain AND [2] cause is not known  Answer Assessment - Initial Assessment Questions Pain comes and goes. Had mammogram last month. 1. SYMPTOM: What's the main symptom you're concerned about?  (e.g., lump, nipple discharge, pain, rash)     Pain, feels like pressure 2. LOCATION: Where is the pain located?     Top of left breast 3. ONSET: When did pain  start?     Probably started before Christmas 4. PRIOR HISTORY: Do you have any history of prior problems with your breasts? (e.g., breast cancer, breast implant, fibrocystic breast disease)     Denies 5. CAUSE: What do you think is causing this symptom?     Unsure 6. OTHER SYMPTOMS: Do you have any other symptoms? (e.g., breast pain, fever, nipple discharge, redness or rash)     Denies  Answer Assessment - Initial Assessment Questions 1. ONSET: When did the cough begin?      Was getting allergy shot due to pollen. Stopped. Thinks cough started after that. 3. SPUTUM: Describe the color of your sputum (e.g., none, dry cough; clear, white, yellow, green)     Clear 5. DIFFICULTY BREATHING: Are you having difficulty breathing? If Yes, ask: How bad is it? (e.g., mild, moderate, severe)       Denies 6. FEVER: Do you have [name] fever? If Yes, ask: What is your temperature, how was it measured, and when did it start?     Denies 7. CARDIAC HISTORY: Do you have any history of heart disease? (e.g., heart attack, congestive heart failure)      Denies 8. LUNG HISTORY: Do you have any history of lung disease?  (e.g., pulmonary embolus, asthma, emphysema)     Denies 9. PE RISK FACTORS: Do you have [name] history of blood clots? (or: recent major surgery, recent prolonged travel, bedridden)     Denies 10. OTHER SYMPTOMS: Do you have any other symptoms? (e.g., runny nose, wheezing, chest pain)       Runny nose  Protocols used: Breast Symptoms-[name]-AH, Cough - Acute Productive-[name]-AH

## 2024-12-31 ENCOUNTER — Ambulatory Visit (INDEPENDENT_AMBULATORY_CARE_PROVIDER_SITE_OTHER): Admitting: Family Medicine

## 2024-12-31 ENCOUNTER — Encounter: Payer: Self-pay | Admitting: Family Medicine

## 2024-12-31 VITALS — BP 120/68 | HR 78 | Temp 98.0°F | Wt 131.8 lb

## 2024-12-31 DIAGNOSIS — F17200 Nicotine dependence, unspecified, uncomplicated: Secondary | ICD-10-CM

## 2024-12-31 DIAGNOSIS — I209 Angina pectoris, unspecified: Secondary | ICD-10-CM | POA: Diagnosis not present

## 2024-12-31 DIAGNOSIS — E1165 Type 2 diabetes mellitus with hyperglycemia: Secondary | ICD-10-CM | POA: Diagnosis not present

## 2024-12-31 MED ORDER — NITROGLYCERIN 0.4 MG SL SUBL: 0.4000 mg | SUBLINGUAL_TABLET | SUBLINGUAL | 3 refills | Status: AC | PRN

## 2024-12-31 NOTE — Patient Instructions (Addendum)
 When he had the chest pain take 2 nitroglycerin  and you could repeat it in 5 minutes and if you are still having pain after several more minutes to call EMS To help with smoking call [phone]

## 2024-12-31 NOTE — Progress Notes (Signed)
" ° °  Subjective:    Patient ID: Bailey [name], female    DOB: 1956/10/16, 69 y.o.   MRN: [id number]  Discussed the use of AI scribe software for clinical note transcription with the patient, who gave verbal consent to proceed.  History of Present Illness   Bailey [name] is a 69 year old female with diabetes who presents with chest pain.  She has been experiencing chest pain since Friday, described as a pressure sensation, 'like somebody just pushed on it,' while at rest. The pain is accompanied by sweating and a feeling of weakness when standing up quickly. No shortness of breath is reported.  The episodes of chest pain have occurred multiple times since Friday, with two or three episodes over the weekend and one episode on Monday. Each episode lasted a few minutes. Sweating was noted during two episodes, and a headache occurred during one, prompting her to take an 81 mg aspirin. Aspirin was taken on Friday, Sunday, and Monday.  The chest pain occurs at rest and not during physical activity. She has not experienced pain or shortness of breath when climbing stairs or walking. However, she mentions feeling hot and experiencing a little pressure while driving.  Her current medications include Lipitor.  Lantus , Ozempic , Jardiance .  She also is using a CGM.  She did show me some of the numbers and she is averaging above 250 after a meal.  Her last hemoglobin A1c was 9.4    She also expressed interest in quitting smoking.      Review of Systems     Objective:    Physical Exam Alert and in no distress.  Cardiac exam shows regular rhythm without murmurs or gallops.  Lungs are clear to auscultation.  EKG shows no acute changes.           Assessment & Plan:  Angina pectoris Intermittent chest pressure with sweating and headache, occurring at rest. - Ordered EKG to assess cardiac function.     Angina pectoris - Plan: EKG 12-Lead, nitroGLYCERIN  (NITROSTAT ) 0.4 MG SL  tablet, Ambulatory referral to Cardiology  Tobacco dependence  Uncontrolled type 2 diabetes mellitus with hyperglycemia (HCC)  Instructions for proper use of nitroglycerin  was given including taking 1 repeating it in 5 minutes and if still having difficulty after that, call EMS.  She was also given information about smoking cessation.  "

## 2025-01-01 NOTE — Progress Notes (Signed)
 " Cardiology Office Note:    Date:  01/02/2025   ID:  Bailey, [name] October 21, 1956, [name]  PCP:  Bailey [name]   [facility] Providers Cardiologist:  None     Referring MD: Bailey Norleen BROCKS, MD   Chief Complaint  Patient presents with   Chest Pain    History of Present Illness:    Bailey [name] is a 69 y.o. female is seen at the request of Dr Bailey for evaluation of angina pectoris. She has a history of tobacco abuse, DM, HLD. Coronary calcium  score in Oct 2024 was 37.   Recently she experienced multiple episodes of chest pressure in left anterior chest. No associated SOB or radiation. Saw Dr Bailey and prescribed sl Ntg but hasn't had to use. No discomfort over past couple of days. Thinks she has had some pressure in her chest since October.   Past Medical History:  Diagnosis Date   Arthritis    generalized   Cataract    LEFT eye   Cervical radiculopathy at C5    Dupuytren's contracture of both hands    L > R   Dyslipidemia    History of adenomatous polyp of colon    1997 and 2003     History of chronic pelvic inflammatory disease    History of gastric ulcer    2008   Hypertriglyceridemia    on meds   Osteoporosis    Right carpal tunnel syndrome    Seasonal allergies    Type 2 diabetes mellitus, uncontrolled    last A1c 8.9 on 07-07-2016   Vitamin D  deficiency     Past Surgical History:  Procedure Laterality Date   CATARACT EXTRACTION Left 05/04/2023   COLONOSCOPY  2019   KN-MAC-2 day Plenvu(exc)-tics/TA-5 yr recall   DUPUYTREN / PALMAR FASCIOTOMY Bilateral    2023   ESOPHAGOGASTRODUODENOSCOPY  last one 2008   FASCIECTOMY Left 08/24/2016   Procedure: LEFT HAND PALMER AND SMALL FINGER DIGiTAL FASCIECTOMY AND RELEASE;  Surgeon: Bailey Pagan, MD;  Location: [facility];  Service: Orthopedics;  Laterality: Left;   LAPAROSCOPIC CHOLECYSTECTOMY  07/17/2000   ROTATOR CUFF REPAIR Right 2000    TOTAL ABDOMINAL HYSTERECTOMY W/ BILATERAL SALPINGOOPHORECTOMY  1998    Current Medications: Active Medications[1]   Allergies:   Metformin , Metformin  and related, Shellfish allergy, Iodinated contrast media, Adhesive [tape], Chantix [varenicline], Codeine, Diphenhydramine , Hydrocodone -acetaminophen , Hydrocodone -acetaminophen , Penicillin g, Varenicline tartrate, Latex, and Penicillins   Social History   Socioeconomic History   Marital status: Married    Spouse name: Bailey [name]   Number of children: 0   Years of education: 15   Highest education level: Associate degree: occupational, scientist, product/process development, or vocational program  Occupational History   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  One story home.  Bailey [name]'s children are adults-2 live in [place], 2 live in [place].     Works for a [employer].     Education: 2 years of college.   Social Drivers of Health   Tobacco Use: High Risk (01/02/2025)   Patient History    Smoking Tobacco Use: Every Day    Smokeless Tobacco Use: Never    Passive Exposure: Not on file  Financial Resource Strain: Low Risk (10/08/2024)   Overall Financial Resource Strain (CARDIA)    Difficulty of Paying Living Expenses: Not very hard  Food Insecurity: No Food Insecurity (10/08/2024)   Epic    Worried About Programme Researcher, Broadcasting/film/video in the Last Year: Never true    Ran Out of Food in the  Last Year: Never true  Transportation Needs: No Transportation Needs (10/08/2024)   Epic    Lack of Transportation (Medical): No    Lack of Transportation (Non-Medical): No  Physical Activity: Insufficiently Active (10/08/2024)   Exercise Vital Sign    Days of Exercise per Week: 3 days    Minutes of Exercise per Session: 30 min  Stress: No Stress Concern Present (10/08/2024)   [facility]iley [name] - Occupational Stress Questionnaire    Feeling of Stress: Only a little  Social Connections: Unknown (10/08/2024)   Social Connection and Isolation Panel    Frequency of Communication with Friends and Family: Three times a week    Frequency of Social Gatherings with Friends and Family: Once a week    Attends Religious Services: Patient declined    Active Member of Clubs or Organizations: Patient declined    Attends Banker Meetings: Not on file    Marital Status: Married  Depression (PHQ2-9): Low Risk (10/10/2024)   Depression (PHQ2-9)    PHQ-2 Score: 0  Alcohol Screen: Not on file  Housing: Unknown (10/08/2024)   Epic    Unable to Pay for Housing in the Last Year: No    Number of Times Moved in the Last Year: Not on file    Homeless in the Last Year: No  Utilities: Not At Risk (07/28/2023)   AHC Utilities    Threatened with loss of utilities: No  Health Literacy: Adequate Health Literacy (08/01/2023)   B1300 Health Literacy    Frequency of need for help with medical instructions: Never     Family History: The patient's family history includes Asthma in her mother; COPD in her mother; Colon cancer in her maternal grandmother; Colon cancer (age of onset: 2) in her brother; Colon polyps in her maternal grandmother; Colon polyps (age of onset: 60) in her brother; Colon polyps (age of onset: 51) in her sister; Dementia in her mother; Diabetes in her brother, maternal grandmother, mother, sister, and sister; Drug abuse in her brother; Heart disease in her  mother and sister; Hyperlipidemia in her brother, brother, brother, mother, sister, and sister; Hypertension in her brother, brother, father, mother, sister, and sister; Kidney disease in her mother; Miscarriages / Stillbirths in her mother; Pancreatitis in her mother; Stroke in her mother; Vision loss in her mother. There is no history of Breast cancer, Esophageal cancer, Rectal cancer, or Stomach cancer.  ROS:   Please see the history of present illness.     All other systems reviewed and are negative.  EKGs/Labs/Other Studies Reviewed:    The following studies were reviewed today:  EKG Interpretation Date/Time:  Thursday January 02 2025 09:56:32 EST Ventricular Rate:  66 PR Interval:  146 QRS Duration:  78 QT Interval:  418 QTC Calculation: 438 R Axis:   24  Text Interpretation: Normal sinus rhythm Normal ECG When compared with ECG of 24-Aug-2016 08:36, No significant change was found Confirmed by Nioma Mccubbins [phone]) on 01/02/2025 9:59:15 AM   Coronary calcium  score 09/28/23: Coronary Calcium  Score   TECHNIQUE: A gated, non-contrast computed tomography scan of the heart was performed using 3mm slice thickness. Axial images were analyzed on a dedicated workstation. Calcium  scoring of the coronary arteries was performed using the Agatston method.   FINDINGS: Coronary Calcium  Score:   Left main: 0   Left anterior descending artery: 0   Left circumflex artery: 0   Right coronary artery: 37.4   Total: 37.4   Percentile: 69th   Pericardium: Normal.   Ascending Aorta: 29 mm at the mid ascending aorta measured in an axial plane. Aortic atherosclerosis.   IMPRESSION: 1. Coronary calcium  score of 37.4. This was 69th percentile for age-, race-, and sex-matched controls.   2.  Aortic atherosclerosis.   3. Non-cardiac: See separate report from [facility]. EKG Interpretation Date/Time:  Thursday January 02 2025 09:56:32 EST Ventricular Rate:  66 PR  Interval:  146 QRS Duration:  78 QT Interval:  418 QTC Calculation: 438 R Axis:   24  Text Interpretation: Normal sinus rhythm Normal ECG When compared with ECG of 24-Aug-2016 08:36, No significant change was found Confirmed by Iridiana Fonner [phone]) on 01/02/2025 9:59:15 AM    Recent Labs: 01/25/2024: Magnesium 2.1 06/06/2024: Hemoglobin 14.6; Platelets 142 10/10/2024: ALT 44; BUN 13; Creatinine, Ser 0.74; Potassium 4.8; Sodium 143; TSH 2.310  Recent Lipid Panel    Component Value Date/Time   CHOL 98 (L) 10/10/2024 0959   TRIG 102 10/10/2024 0959   HDL 37 (L) 10/10/2024 0959   CHOLHDL 2.6 10/10/2024 0959   CHOLHDL 7.0 (H) 11/12/2015 1153   VLDL NOT CALC 11/12/2015 1153   LDLCALC 42 10/10/2024 0959   LDLDIRECT 45 06/10/2016 1213     Risk Assessment/Calculations:                Physical Exam:    VS:  BP 136/60 (BP Location: Right Arm, Patient Position: Sitting, Cuff Size: Normal)   Pulse 64   Ht 5' 1.5 (1.562 m)   Wt 132 lb (59.9 kg)   SpO2 98%   BMI 24.54 kg/m     Wt Readings from Last 3 Encounters:  01/02/25 132 lb (59.9 kg)  12/31/24 131 lb 12.8 oz (59.8 kg)  10/10/24 132 lb 3.2 oz (60 kg)     GEN:  Well nourished, well developed in no acute distress HEENT: Normal NECK: No JVD; No carotid bruits LYMPHATICS: No lymphadenopathy CARDIAC: RRR, no murmurs, rubs, gallops RESPIRATORY:  Clear to auscultation without rales, wheezing or rhonchi  ABDOMEN: Soft, non-tender, non-distended MUSCULOSKELETAL:  No edema; No deformity  SKIN: Warm and dry NEUROLOGIC:  Alert and oriented x 3 PSYCHIATRIC:  Normal affect   ASSESSMENT:    1. Precordial pain   2. Hypercholesteremia   3. Uncontrolled type 2 diabetes mellitus with hyperglycemia (HCC)   4. Tobacco abuse    PLAN:    In order of problems listed above:  Chest pain. Patient has multiple risk factors for CAD and had some coronary calcification noted on prior CT. Ecg is normal.  Will initiate antianginal therapy  with Toprol  XL 25 mg daily. ASA 81 mg daily. Sl Ntg prn. Will assess coronary risk with coronary CTA. Will pretreat with history of contrast  allergy. Follow up post CT. IDDM poorly controlled. Last A1c 9.4% HLD excellent control on statin. LDL 42 Tobacco abuse. Counseled on importance of cessation. No success in past with nicotine  replacement products- patient plans to sign up for smoking cessation class.            Medication Adjustments/Labs and Tests Ordered: Current medicines are reviewed at length with the patient today.  Concerns regarding medicines are outlined above.  Orders Placed This Encounter  Procedures   CT CORONARY MORPH W/CTA COR W/SCORE W/CA W/CM &/OR WO/CM   Basic metabolic panel with GFR   EKG 87-Ozji   Meds ordered this encounter  Medications   aspirin  EC 81 MG tablet    Sig: Take 1 tablet (81 mg total) by mouth daily. Swallow whole.   metoprolol  succinate (TOPROL  XL) 25 MG 24 hr tablet    Sig: Take 1 tablet (25 mg total) by mouth daily.    Dispense:  90 tablet    Refill:  3   metoprolol  tartrate (LOPRESSOR ) 50 MG tablet    Sig: Take 50 mg 2 hours before Coronary CT    Dispense:  1 tablet    Refill:  0    Patient Instructions  Medication Instructions:  Start Aspirin  81 mg daily Start Toprol  XL 25 mg daily Continue all other medications *If you need a refill on your cardiac medications before your next appointment, please call your pharmacy*  Lab Work: Bmet today  Testing/Procedures: Coronary CT will be scheduled after approved by insurance  Follow instructions below  Follow-Up: At [facility], you and your health needs are our priority.  As part of our continuing mission to provide you with exceptional heart care, our providers are all part of one team.  This team includes your primary Cardiologist (physician) and Advanced Practice Providers or APPs (Physician Assistants and Nurse Practitioners) who all work together to provide you with the  care you need, when you need it.  Your next appointment:  After test    Provider:  Dr.Reva Pinkley       Your cardiac CT will be scheduled at one of the below locations:   [facility] [address] [place], [place] [address] [phone] (Severe contrast allergies only)  OR   [facility] [address] [place], [place] [address] [phone]  OR   [facility] [address] [place], [place] [address] [phone]  OR   [facility] [address]  [place], [place] [address]  OR   [facility] [address] [place], [place] [phone]  If scheduled at [facility], please arrive at the [facility] Entrance (Entrance C2) of [facility] 30 minutes prior to test start time. You can use the FREE valet parking offered at entrance C (encouraged to control the heart rate for the test)  Proceed to the [facility] Radiology Department (first floor) to check-in and test prep.  All radiology patients and guests should use entrance C2 at [facility], accessed from [facility], even though the hospital's physical address listed is [address]  If scheduled at the Heart and Vascular Tower at [address], please enter the parking lot using the [address] entrance and use the FREE valet service at the patient drop-off area. Enter the building and check-in with registration on the main floor.  If scheduled at [facility], please arrive  to the Heart and Vascular Center 15 mins early for check-in and test prep.  There is spacious parking and easy access to the radiology department from the [facility] Heart and Vascular entrance. Please enter here and check-in with the desk attendant.   If scheduled at [facility], please arrive 30 minutes early for check-in and test prep.  Please follow these instructions  carefully (unless otherwise directed):  An IV will be required for this test and Nitroglycerin  will be given.    On the Night Before the Test: Be sure to Drink plenty of water. Do not consume any caffeinated/decaffeinated beverages or chocolate 12 hours prior to your test. Do not take any antihistamines 12 hours prior to your test.  If the patient has contrast allergy: Patient will need a prescription for Prednisone  and very clear instructions (as follows): Prednisone  50 mg - take 13 hours prior to test Take another Prednisone  50 mg 7 hours prior to test Take another Prednisone  50 mg 1 hour prior to test Take Benadryl  50 mg 1 hour prior to test Patient must complete all four doses of above prophylactic medications. Patient will need a ride after test due to Benadryl .  On the Day of the Test: Drink plenty of water until 1 hour prior to the test. Do not eat any food 1 hour prior to test. You may take your regular medications prior to the test.  Take metoprolol  (Lopressor ) two hours prior to test. If you take Furosemide/Hydrochlorothiazide/Spironolactone/Chlorthalidone, please HOLD on the morning of the test. Patients who wear a continuous glucose monitor MUST remove the device prior to scanning. FEMALES- please wear underwire-free bra if available, avoid dresses & tight clothing       After the Test: Drink plenty of water. After receiving IV contrast, you may experience a mild flushed feeling. This is normal. On occasion, you may experience a mild rash up to 24 hours after the test. This is not dangerous. If this occurs, you can take Benadryl  25 mg, Zyrtec , Claritin, or Allegra and increase your fluid intake. (Patients taking Tikosyn should avoid Benadryl , and may take Zyrtec , Claritin, or Allegra) If you experience trouble breathing, this can be serious. If it is severe call 911 IMMEDIATELY. If it is mild, please call our office.  We will call to schedule your test 2-4 weeks out  understanding that some insurance companies will need an authorization prior to the service being performed.   For more information and frequently asked questions, please visit our website : [email or web address]  For non-scheduling related questions, please contact the cardiac imaging nurse navigator should you have any questions/concerns: Cardiac Imaging Nurse Navigators Direct Office Dial: [phone]   For scheduling needs, including cancellations and rescheduling, please call Brittany, [phone].     We recommend signing up for the patient portal called MyChart.  Sign up information is provided on this After Visit Summary.  MyChart is used to connect with patients for Virtual Visits (Telemedicine).  Patients are able to view lab/test results, encounter notes, upcoming appointments, etc.  Non-urgent messages can be sent to your provider as well.   To learn more about what you can do with MyChart, go to [email or web address].         Signed, Blandon Offerdahl, MD  01/02/2025 10:02 AM    [facility]     [1]  Current Meds  Medication Sig   aspirin  EC 81 MG tablet Take 1 tablet (81 mg total) by mouth daily. Swallow whole.   atorvastatin  (  LIPITOR) 40 MG tablet TAKE 1 TABLET BY MOUTH EVERY DAY   CALCIUM  PO Take 600 mg by mouth daily.   ciclopirox  (PENLAC ) 8 % solution Apply topically at bedtime. Apply over nail and surrounding skin. Apply daily over previous coat. After seven (7) days, may remove with alcohol and continue cycle.   Continuous Blood Gluc Receiver (DEXCOM G7 RECEIVER) DEVI UAD   Continuous Glucose Sensor (DEXCOM G7 SENSOR) MISC USE TO CHECK BLOOD SUGAR THREE TIMES DAILY. CHANGE SENSORS ONCE EVERY 10 DAYS.   gabapentin  (NEURONTIN ) 300 MG capsule Take 1 capsule (300 mg total) by mouth at bedtime as needed. TAKE 1 CAPSULE(300 MG) BY MOUTH AT BEDTIME   Glucagon , rDNA, (GLUCAGON  EMERGENCY) 1 MG KIT Inj 1 mg IM as needed for significant low blood  sugar episode   glucose blood test strip 2 (two) times daily.   Insulin  Pen Needle (BD PEN NEEDLE MINI ULTRAFINE) 31G X 5 MM MISC USE ONCE A DAY WITH INSULIN  PEN   JARDIANCE  10 MG TABS tablet TAKE 1 TABLET BY MOUTH EVERY DAY   LANTUS  SOLOSTAR 100 UNIT/ML Solostar Pen 30 units morning, 15 units QHS   metoprolol  succinate (TOPROL  XL) 25 MG 24 hr tablet Take 1 tablet (25 mg total) by mouth daily.   metoprolol  tartrate (LOPRESSOR ) 50 MG tablet Take 50 mg 2 hours before Coronary CT   Multiple Vitamins-Minerals (MULTIVITAMIN WITH MINERALS) tablet Take 1 tablet by mouth every other day.    nitroGLYCERIN  (NITROSTAT ) 0.4 MG SL tablet Place 1 tablet (0.4 mg total) under the tongue every 5 (five) minutes as needed for chest pain.   Olopatadine  HCl 0.2 % SOLN Apply 1 drop to eye daily.   Semaglutide ,0.25 or 0.5MG /DOS, 2 MG/3ML SOPN Inject 0.5 mg into the skin once a week.   venlafaxine  (EFFEXOR ) 25 MG tablet Take 1 tablet (25 mg total) by mouth 2 (two) times daily.   Vitamin D , Cholecalciferol, 25 MCG (1000 UT) CAPS Take 1,000 Int'l Units by mouth daily.   "

## 2025-01-02 ENCOUNTER — Ambulatory Visit: Attending: Cardiology | Admitting: Cardiology

## 2025-01-02 ENCOUNTER — Encounter: Payer: Self-pay | Admitting: Cardiology

## 2025-01-02 VITALS — BP 136/60 | HR 64 | Ht 61.5 in | Wt 132.0 lb

## 2025-01-02 DIAGNOSIS — E7800 Pure hypercholesterolemia, unspecified: Secondary | ICD-10-CM | POA: Diagnosis not present

## 2025-01-02 DIAGNOSIS — R072 Precordial pain: Secondary | ICD-10-CM | POA: Diagnosis not present

## 2025-01-02 DIAGNOSIS — E1165 Type 2 diabetes mellitus with hyperglycemia: Secondary | ICD-10-CM

## 2025-01-02 DIAGNOSIS — Z720 Tobacco use: Secondary | ICD-10-CM | POA: Diagnosis not present

## 2025-01-02 LAB — BASIC METABOLIC PANEL WITH GFR
BUN/Creatinine Ratio: 13 (ref 12–28)
BUN: 10 mg/dL (ref 8–27)
CO2: 24 mmol/L (ref 20–29)
Calcium: 9.6 mg/dL (ref 8.7–10.3)
Chloride: 106 mmol/L (ref 96–106)
Creatinine, Ser: 0.78 mg/dL (ref 0.57–1.00)
Glucose: 137 mg/dL — ABNORMAL HIGH (ref 70–99)
Potassium: 4.2 mmol/L (ref 3.5–5.2)
Sodium: 143 mmol/L (ref 134–144)
eGFR: 82 mL/min/1.73

## 2025-01-02 MED ORDER — ASPIRIN 81 MG PO TBEC: 81.0000 mg | DELAYED_RELEASE_TABLET | Freq: Every day | ORAL | Status: AC

## 2025-01-02 MED ORDER — METOPROLOL TARTRATE 50 MG PO TABS: ORAL_TABLET | ORAL | 0 refills | Status: AC

## 2025-01-02 MED ORDER — DIPHENHYDRAMINE HCL 50 MG PO TABS: ORAL_TABLET | ORAL | 0 refills | Status: AC

## 2025-01-02 MED ORDER — METOPROLOL SUCCINATE ER 25 MG PO TB24: 25.0000 mg | ORAL_TABLET | Freq: Every day | ORAL | 3 refills | Status: AC

## 2025-01-02 MED ORDER — PREDNISONE 50 MG PO TABS: ORAL_TABLET | ORAL | 0 refills | Status: AC

## 2025-01-02 NOTE — Patient Instructions (Addendum)
 Medication Instructions:  Start Aspirin  81 mg daily Start Toprol  XL 25 mg daily Continue all other medications *If you need a refill on your cardiac medications before your next appointment, please call your pharmacy*  Lab Work: Bmet today  Testing/Procedures: Coronary CT will be scheduled after approved by insurance  Follow instructions below  Follow-Up: At [facility], you and your health needs are our priority.  As part of our continuing mission to provide you with exceptional heart care, our providers are all part of one team.  This team includes your primary Cardiologist (physician) and Advanced Practice Providers or APPs (Physician Assistants and Nurse Practitioners) who all work together to provide you with the care you need, when you need it.  Your next appointment:  After test    Provider:  Dr.Jordan       Your cardiac CT will be scheduled at one of the below locations:   [facility] [address] [place], [place] [address] [phone] (Severe contrast allergies only)  OR   [facility] [address] [place], [place] [address] [phone]  OR   [facility] [address] [place], [place] [address] [phone]  OR   [facility] and Vascular Tower [address]  [place], [place] [address]  OR   [facility] [address] [place], [place] [phone]  If scheduled at [facility], please arrive at the [facility] and Children's Entrance (Entrance C2) of [facility] 30 minutes prior to test start time. You can use the FREE valet parking offered at entrance C (encouraged to control the heart rate for the test)  Proceed to the [facility] Radiology Department (first floor) to check-in and test prep.  All radiology patients and guests should use entrance C2 at [facility], accessed from [facility], even though the hospital's physical address  listed is [address].  If scheduled at the Heart and Vascular Tower at [address], please enter the parking lot using the [address] entrance and use the FREE valet service at the patient drop-off area. Enter the building and check-in with registration on the main floor.  If scheduled at [facility], please arrive to the Heart and Vascular Center 15 mins early for check-in and test prep.  There is spacious parking and easy access to the radiology department from the [facility] Heart and Vascular entrance. Please enter here and check-in with the desk attendant.   If scheduled at [facility], please arrive 30 minutes early for check-in and test prep.  Please follow these instructions carefully (unless otherwise directed):  An IV will be required for this test and Nitroglycerin  will be given.    On the Night Before the Test: Be sure to Drink plenty of water. Do not consume any caffeinated/decaffeinated beverages or chocolate 12 hours prior to your test. Do not take any antihistamines 12 hours prior to your test.  If the patient has contrast allergy: Patient will need a prescription for Prednisone  and very clear instructions (as follows): Prednisone  50 mg - take 13 hours prior to test Take another Prednisone  50 mg 7 hours prior to test Take another Prednisone  50 mg 1 hour prior to test Take Benadryl  50 mg 1 hour prior to test Patient must complete all four doses of above prophylactic medications. Patient will need a ride after test due to Benadryl .  On the Day of the Test: Drink  plenty of water until 1 hour prior to the test. Do not eat any food 1 hour prior to test. You may take your regular medications prior to the test.  Take metoprolol  50 mg two hours prior to test.   Hold Toprol  25 mg morning of. If you take Furosemide/Hydrochlorothiazide/Spironolactone/Chlorthalidone, please HOLD on the morning of the test. Patients who wear a  continuous glucose monitor MUST remove the device prior to scanning. FEMALES- please wear underwire-free bra if available, avoid dresses & tight clothing       After the Test: Drink plenty of water. After receiving IV contrast, you may experience a mild flushed feeling. This is normal. On occasion, you may experience a mild rash up to 24 hours after the test. This is not dangerous. If this occurs, you can take Benadryl  25 mg, Zyrtec , Claritin, or Allegra and increase your fluid intake. (Patients taking Tikosyn should avoid Benadryl , and may take Zyrtec , Claritin, or Allegra) If you experience trouble breathing, this can be serious. If it is severe call 911 IMMEDIATELY. If it is mild, please call our office.  We will call to schedule your test 2-4 weeks out understanding that some insurance companies will need an authorization prior to the service being performed.   For more information and frequently asked questions, please visit our website : [email or web address]  For non-scheduling related questions, please contact the cardiac imaging nurse navigator should you have any questions/concerns: Cardiac Imaging Nurse Navigators Direct Office Dial: [phone]   For scheduling needs, including cancellations and rescheduling, please call Brittany, [phone].     We recommend signing up for the patient portal called MyChart.  Sign up information is provided on this After Visit Summary.  MyChart is used to connect with patients for Virtual Visits (Telemedicine).  Patients are able to view lab/test results, encounter notes, upcoming appointments, etc.  Non-urgent messages can be sent to your provider as well.   To learn more about what you can do with MyChart, go to [email or web address].

## 2025-01-02 NOTE — Addendum Note (Signed)
 Addended by: CHRISTIANNE CHANNING PARAS on: 01/02/2025 11:24 AM   Modules accepted: Orders

## 2025-01-03 ENCOUNTER — Ambulatory Visit: Payer: Self-pay | Admitting: Cardiology

## 2025-01-03 NOTE — Telephone Encounter (Signed)
 Patient was returning call. Please advise ?

## 2025-01-15 ENCOUNTER — Encounter (HOSPITAL_COMMUNITY): Payer: Self-pay

## 2025-01-16 ENCOUNTER — Telehealth (HOSPITAL_COMMUNITY): Payer: Self-pay | Admitting: Emergency Medicine

## 2025-01-16 ENCOUNTER — Encounter (HOSPITAL_COMMUNITY): Payer: Self-pay | Admitting: Emergency Medicine

## 2025-01-16 NOTE — Telephone Encounter (Signed)
 Reaching out to patient to offer assistance regarding upcoming cardiac imaging study; pt verbalizes understanding of appt date/time, parking situation and where to check in, pre-test NPO status and medications ordered, and verified current allergies; name and call back number provided for further questions should they arise Rockwell Alexandria RN Navigator Cardiac Imaging [facility] [phone] office [phone] cell

## 2025-01-17 ENCOUNTER — Ambulatory Visit (HOSPITAL_COMMUNITY)
Admission: RE | Admit: 2025-01-17 | Discharge: 2025-01-17 | Disposition: A | Source: Ambulatory Visit | Attending: Cardiology | Admitting: Cardiology

## 2025-01-17 DIAGNOSIS — R072 Precordial pain: Secondary | ICD-10-CM | POA: Insufficient documentation

## 2025-01-17 MED ORDER — DIPHENHYDRAMINE HCL 50 MG/ML IJ SOLN
INTRAMUSCULAR | Status: AC
  Filled 2025-01-17: qty 1

## 2025-01-17 MED ORDER — DIPHENHYDRAMINE HCL 50 MG/ML IJ SOLN
50.0000 mg | Freq: Once | INTRAMUSCULAR | Status: AC
  Administered 2025-01-17: 50 mg via INTRAVENOUS

## 2025-01-17 MED ORDER — IOHEXOL 350 MG/ML SOLN
100.0000 mL | Freq: Once | INTRAVENOUS | Status: AC | PRN
  Administered 2025-01-17: 100 mL via INTRAVENOUS

## 2025-01-17 MED ORDER — NITROGLYCERIN 0.4 MG SL SUBL
0.8000 mg | SUBLINGUAL_TABLET | Freq: Once | SUBLINGUAL | Status: AC
  Administered 2025-01-17: 0.8 mg via SUBLINGUAL

## 2025-01-21 NOTE — Progress Notes (Signed)
 Results through MyChart

## 2025-02-11 ENCOUNTER — Ambulatory Visit

## 2025-02-20 ENCOUNTER — Ambulatory Visit: Admitting: Cardiology
